# Patient Record
Sex: Female | Born: 1945 | Race: White | Hispanic: No | Marital: Married | Smoking: Former smoker
Health system: Southern US, Academic
[De-identification: ages and names within clinical notes are randomized; demographics above are authoritative.]

## PROBLEM LIST (undated history)

## (undated) DIAGNOSIS — C801 Malignant (primary) neoplasm, unspecified: Secondary | ICD-10-CM

## (undated) DIAGNOSIS — F319 Bipolar disorder, unspecified: Secondary | ICD-10-CM

## (undated) DIAGNOSIS — M129 Arthropathy, unspecified: Secondary | ICD-10-CM

## (undated) DIAGNOSIS — Z973 Presence of spectacles and contact lenses: Secondary | ICD-10-CM

## (undated) DIAGNOSIS — N189 Chronic kidney disease, unspecified: Secondary | ICD-10-CM

## (undated) DIAGNOSIS — J45909 Unspecified asthma, uncomplicated: Secondary | ICD-10-CM

## (undated) DIAGNOSIS — F419 Anxiety disorder, unspecified: Secondary | ICD-10-CM

## (undated) DIAGNOSIS — F32A Depression, unspecified: Secondary | ICD-10-CM

## (undated) DIAGNOSIS — F329 Major depressive disorder, single episode, unspecified: Secondary | ICD-10-CM

## (undated) HISTORY — PX: ABDOMINAL HYSTERECTOMY: SHX81

## (undated) HISTORY — PX: HX HYSTERECTOMY: SHX81

## (undated) HISTORY — PX: HX DILATION AND CURETTAGE: SHX78

## (undated) HISTORY — PX: HX APPENDECTOMY: SHX54

## (undated) HISTORY — PX: PORTACATH PLACEMENT: SHX2246

---

## 1990-03-14 ENCOUNTER — Other Ambulatory Visit (HOSPITAL_COMMUNITY): Payer: Self-pay

## 2016-06-14 ENCOUNTER — Encounter (HOSPITAL_COMMUNITY): Payer: Self-pay

## 2016-06-14 ENCOUNTER — Emergency Department (HOSPITAL_COMMUNITY): Payer: Medicare PPO

## 2016-06-14 ENCOUNTER — Emergency Department (HOSPITAL_COMMUNITY)
Admission: EM | Admit: 2016-06-14 | Discharge: 2016-06-14 | Disposition: A | Discharge status: Home / Self Care | Payer: Medicare PPO | Attending: Emergency Medicine | Admitting: Emergency Medicine

## 2016-06-14 DIAGNOSIS — Y929 Unspecified place or not applicable: Secondary | ICD-10-CM | POA: Diagnosis not present

## 2016-06-14 DIAGNOSIS — J45909 Unspecified asthma, uncomplicated: Secondary | ICD-10-CM | POA: Insufficient documentation

## 2016-06-14 DIAGNOSIS — Y999 Unspecified external cause status: Secondary | ICD-10-CM | POA: Insufficient documentation

## 2016-06-14 DIAGNOSIS — Z79899 Other long term (current) drug therapy: Secondary | ICD-10-CM | POA: Insufficient documentation

## 2016-06-14 DIAGNOSIS — S52591A Other fractures of lower end of right radius, initial encounter for closed fracture: Secondary | ICD-10-CM | POA: Diagnosis not present

## 2016-06-14 DIAGNOSIS — Y9301 Activity, walking, marching and hiking: Secondary | ICD-10-CM | POA: Insufficient documentation

## 2016-06-14 DIAGNOSIS — W108XXA Fall (on) (from) other stairs and steps, initial encounter: Secondary | ICD-10-CM | POA: Diagnosis not present

## 2016-06-14 DIAGNOSIS — S0990XA Unspecified injury of head, initial encounter: Secondary | ICD-10-CM | POA: Diagnosis present

## 2016-06-14 HISTORY — DX: Anxiety disorder, unspecified: F41.9

## 2016-06-14 HISTORY — DX: Unspecified asthma, uncomplicated: J45.909

## 2016-06-14 HISTORY — DX: Depression, unspecified: F32.A

## 2016-06-14 HISTORY — DX: Major depressive disorder, single episode, unspecified: F32.9

## 2016-06-14 MED ORDER — HYDROCODONE-ACETAMINOPHEN 5-325 MG PO TABS: 2.0000 | ORAL_TABLET | ORAL | 0 refills | Status: AC | PRN

## 2016-06-14 NOTE — ED Provider Notes (Signed)
[facility]-EMERGENCY DEPT Provider Note   CSN: [id number] Arrival date & time: 06/14/16  [id number]     History   Chief Complaint Chief Complaint  Patient presents with  . Fall  . Arm Pain    HPI Lowella DellCarol Batty is a 70 y.o. female.  70 year old female presents after mechanical fall while walking down steps. Struck the right side of her head and had a brief loss of consciousness. Denies any neck pain. Does have some sharp right wrist pain is worse with movement. Denies any numbness or tingling to the right hand. Denies any back, chest, abdominal, hip pain. No confusion noted. No visual changes. No vomiting noted. Does not take any blood thinners. Called EMS and was transported here.      Past Medical History:  Diagnosis Date  . Anxiety   . Asthma   . Depression     There are no active problems to display for this patient.   Past Surgical History:  Procedure Laterality Date  . ABDOMINAL HYSTERECTOMY      OB History    No data available       Home Medications    Prior to Admission medications   Not on File    Family History History reviewed. No pertinent family history.  Social History Social History  Substance Use Topics  . Smoking status: Never Smoker  . Smokeless tobacco: Never Used  . Alcohol use No     Allergies   Amoxicillin   Review of Systems Review of Systems  All other systems reviewed and are negative.    Physical Exam Updated Vital Signs BP 154/87   Pulse 86   Temp 97.8 F (36.6 C) (Oral)   Resp 15   SpO2 95%   Physical Exam  Constitutional: She is oriented to person, place, and time. She appears well-developed and well-nourished.  Non-toxic appearance. No distress.  HENT:  Head: Normocephalic and atraumatic.    Eyes: Conjunctivae, EOM and lids are normal. Pupils are equal, round, and reactive to light.  Neck: Normal range of motion. Neck supple. No tracheal deviation present. No thyroid mass present.  Cardiovascular: Normal  rate, regular rhythm and normal heart sounds.  Exam reveals no gallop.   No murmur heard. Pulmonary/Chest: Effort normal and breath sounds normal. No stridor. No respiratory distress. She has no decreased breath sounds. She has no wheezes. She has no rhonchi. She has no rales.  Abdominal: Soft. Normal appearance and bowel sounds are normal. She exhibits no distension. There is no tenderness. There is no rebound and no CVA tenderness.  Musculoskeletal: Normal range of motion. She exhibits no edema or tenderness.       Arms: Neurological: She is alert and oriented to person, place, and time. She has normal strength. No cranial nerve deficit or sensory deficit. GCS eye subscore is 4. GCS verbal subscore is 5. GCS motor subscore is 6.  Skin: Skin is warm and dry. No abrasion and no rash noted.  Psychiatric: She has a normal mood and affect. Her speech is normal and behavior is normal.  Nursing note and vitals reviewed.    ED Treatments / Results  Labs (all labs ordered are listed, but only abnormal results are displayed) Labs Reviewed - No data to display  EKG  EKG Interpretation None       Radiology No results found.  Procedures Procedures (including critical care time)  Medications Ordered in ED Medications - No data to display   Initial Impression / Assessment and  Plan / ED Course  I have reviewed the triage vital signs and the nursing notes.  Pertinent labs & imaging results that were available during my care of the patient were reviewed by me and considered in my medical decision making (see chart for details).  Clinical Course     Splint applied by orthopedic tech. Will follow with her hand surgeon at home.  Final Clinical Impressions(s) / ED Diagnoses   Final diagnoses:  None    New Prescriptions New Prescriptions   No medications on file     Lorre NickAnthony Courtney Fenlon, MD 06/14/16 1121

## 2016-06-14 NOTE — ED Triage Notes (Signed)
Per EMS, pt from  Home.  Pt was walking into garage.  Pt tripped and fell down 2-3 steps.  Pt unaware of LOC.  Remembers full event.  Pt has abrasion to rt frontal forehead. Pt c/o rt arm/shoulder and rt wrist pain.  No deformity noted.  Pt on back board with collar in place.  Log roll for removal of board.  No c/o back pain.  Vitals:  166/87, hr 90, resp 18, 96% ra

## 2016-06-14 NOTE — ED Notes (Signed)
Bed: [id number] Expected date:  Expected time:  Means of arrival:  Comments: 70 yo fall

## 2018-07-02 IMAGING — CT CT HEAD W/O CM
3 of 4 series · 15 of 47 positions shown, 18 images · non-contrast
Comparison: None.

CLINICAL DATA: Pain following fall

EXAM:
CT HEAD WITHOUT CONTRAST
TECHNIQUE: Contiguous axial images were obtained from the base of the skull
through the vertex without intravenous contrast.

[Series 2: head w/o · axial · non-contrast · 0.46mm/px · z∈[+1389,+1519]mm · 9 of 34 slices shown, 12 images]
[im 4/34  brain]
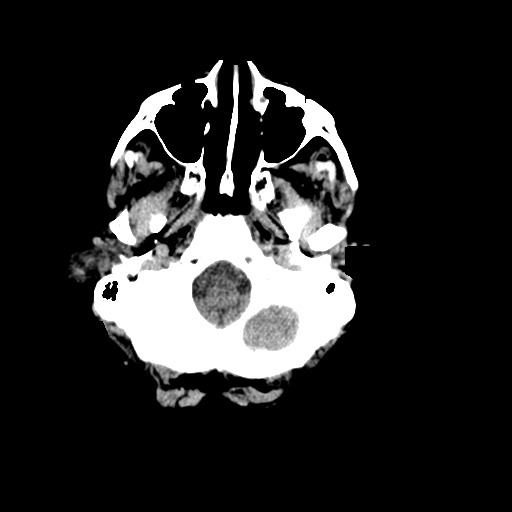
[im 4/34  bone]
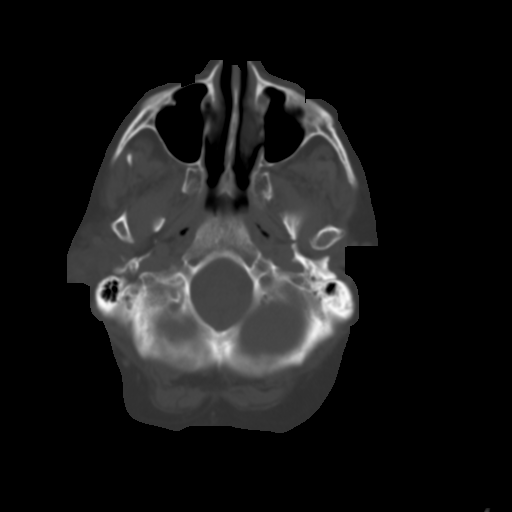
[im 7/34  brain]
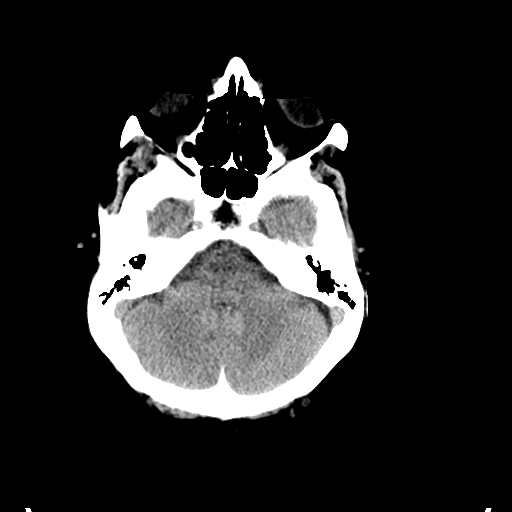
[im 10/34  brain]
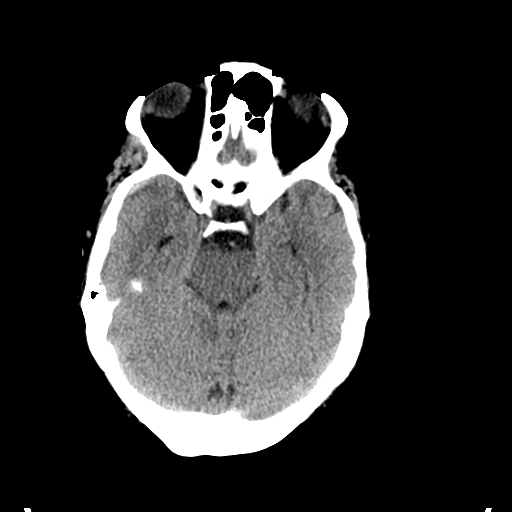
[im 14/34  brain]
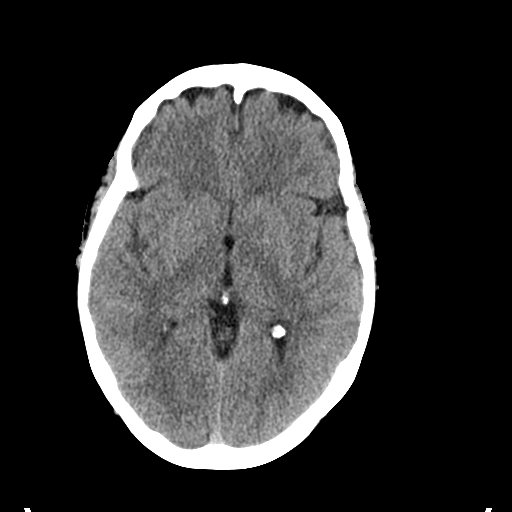
[im 17/34  brain]
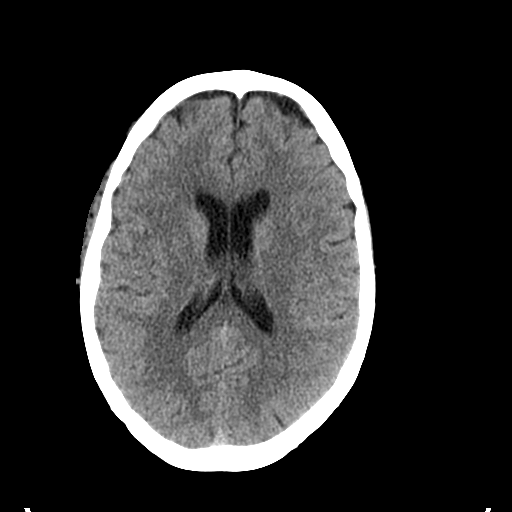
[im 17/34  bone]
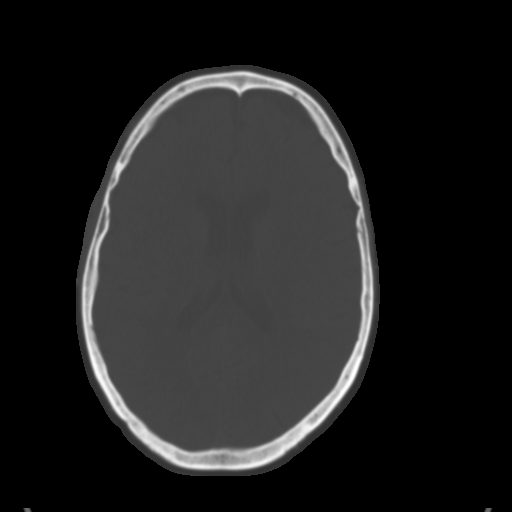
[im 20/34  brain]
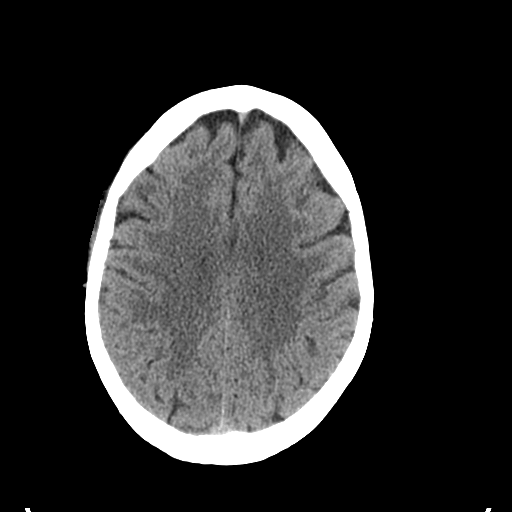
[im 24/34  brain]
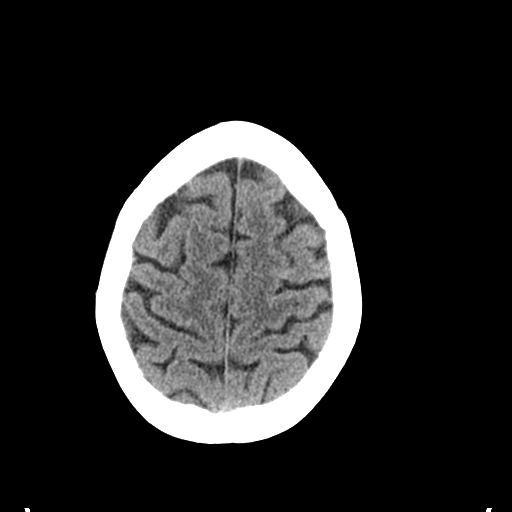
[im 27/34  brain]
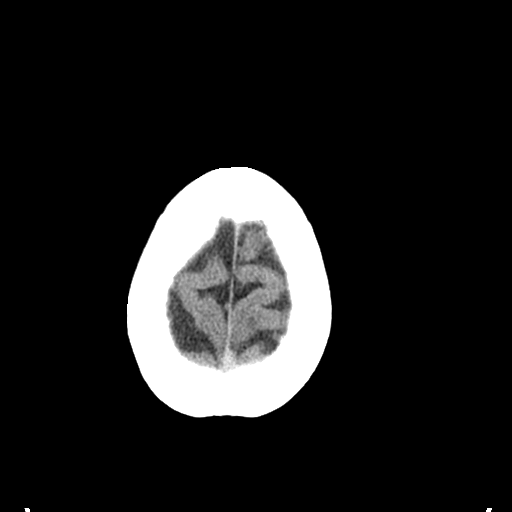
[im 30/34  brain]
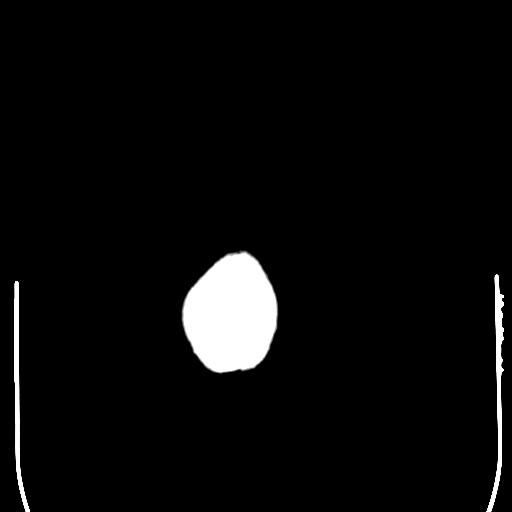
[im 30/34  bone]
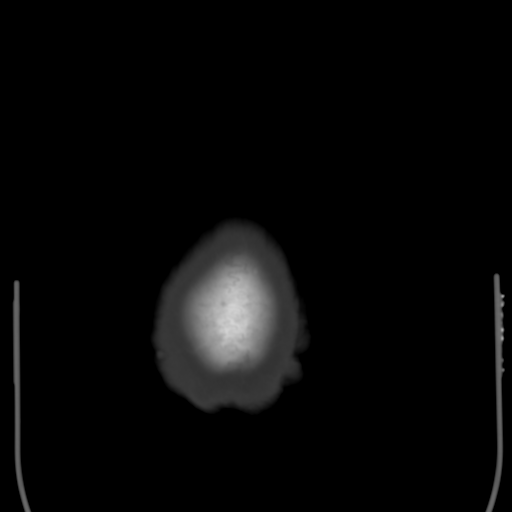

[Series 5: coronal · coronal · 0.32mm/px · 3 of 77 slices shown]
[im 26/77  brain]
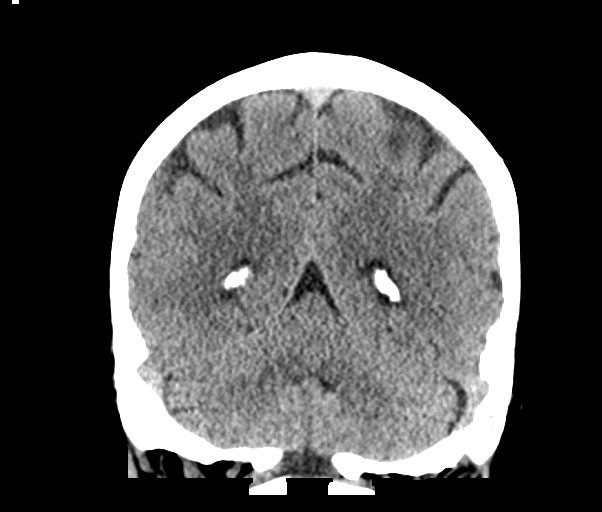
[im 34/77  brain]
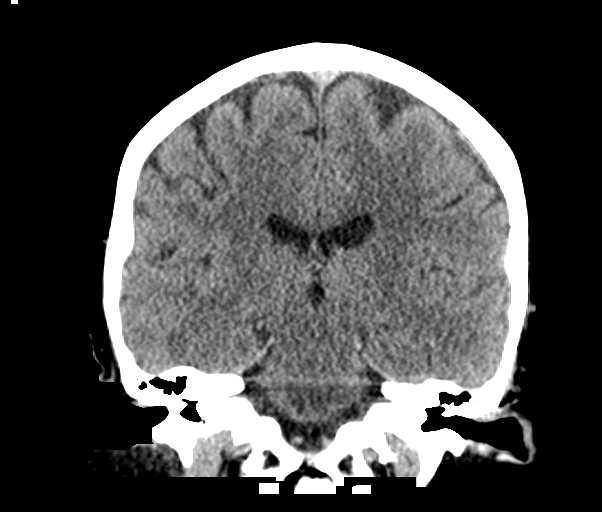
[im 43/77  brain]
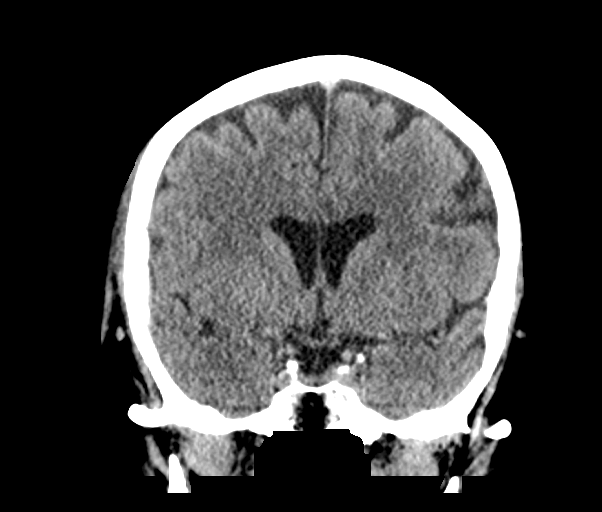

[Series 6: sagittal · sagittal · 0.32mm/px · 3 of 65 slices shown]
[im 22/65  brain]
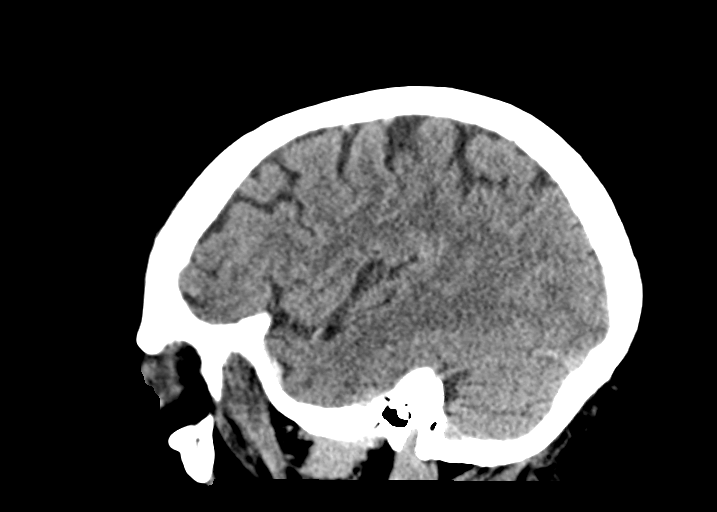
[im 33/65  brain]
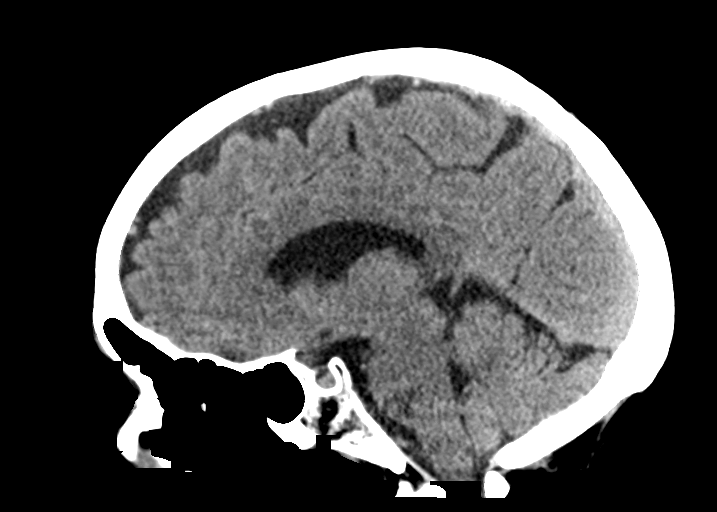
[im 43/65  brain]
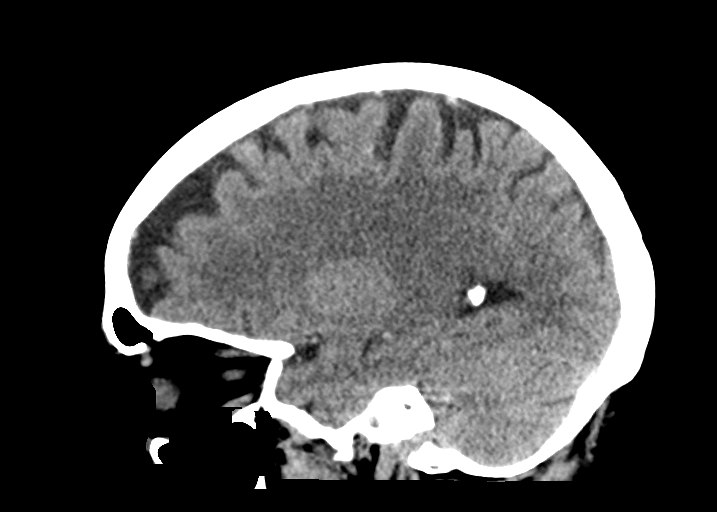

[15 of 47 positions shown; findings below may reference images not displayed]

FINDINGS: Brain: The ventricles are normal in size and configuration. There is
no intracranial mass, hemorrhage, extra-axial fluid collection, or
midline shift. Gray-white compartments are normal. No acute infarct
evident.

Vascular: There is no hyperdense vessel. There is no appreciable
vascular calcification.

Skull: There is a right frontal-temporal junction region scalp
hematoma. The bony calvarium appears intact.

Sinuses/Orbits: Visualized paranasal sinuses are clear. Orbits
appear symmetric bilaterally.

Other: Mastoid air cells are clear.
IMPRESSION: Right frontal -temporal junction scalp hematoma. No fracture
evident. No intracranial mass, hemorrhage, or extra-axial fluid
collection. Gray-white compartments appear normal.

## 2021-03-27 LAB — LAB: COLOGUARD RESULT: POSITIVE — AB

## 2021-03-27 LAB — COLOGUARD® COLON CANCER SCREEN: COLOGUARD RESULT: POSITIVE — AB

## 2021-11-08 ENCOUNTER — Encounter (HOSPITAL_BASED_OUTPATIENT_CLINIC_OR_DEPARTMENT_OTHER): Payer: Self-pay

## 2021-11-08 ENCOUNTER — Emergency Department
Admission: EM | Admit: 2021-11-08 | Discharge: 2021-11-08 | Discharge status: Against Medical Advice | Payer: Commercial Managed Care - PPO | Attending: Emergency Medicine | Admitting: Emergency Medicine

## 2021-11-08 ENCOUNTER — Emergency Department (EMERGENCY_DEPARTMENT_HOSPITAL)
Admission: EM | Admit: 2021-11-08 | Discharge: 2021-11-08 | Disposition: A | Discharge status: Home / Self Care | Payer: Commercial Managed Care - PPO | Source: Home / Self Care | Attending: Emergency Medical Services | Admitting: Emergency Medical Services

## 2021-11-08 ENCOUNTER — Other Ambulatory Visit: Payer: Self-pay

## 2021-11-08 DIAGNOSIS — R609 Edema, unspecified: Secondary | ICD-10-CM

## 2021-11-08 DIAGNOSIS — M79606 Pain in leg, unspecified: Secondary | ICD-10-CM

## 2021-11-08 DIAGNOSIS — Z5329 Procedure and treatment not carried out because of patient's decision for other reasons: Secondary | ICD-10-CM | POA: Insufficient documentation

## 2021-11-08 DIAGNOSIS — Z5321 Procedure and treatment not carried out due to patient leaving prior to being seen by health care provider: Secondary | ICD-10-CM | POA: Insufficient documentation

## 2021-11-08 HISTORY — DX: Malignant (primary) neoplasm, unspecified: C80.1

## 2021-11-08 HISTORY — DX: Arthropathy, unspecified: M12.9

## 2021-11-08 HISTORY — DX: Presence of spectacles and contact lenses: Z97.3

## 2021-11-08 HISTORY — DX: Unspecified asthma, uncomplicated: J45.909

## 2021-11-08 LAB — COMPREHENSIVE METABOLIC PANEL, NON-FASTING
ALBUMIN/GLOBULIN RATIO: 1.1 (ref 0.8–1.4)
ALBUMIN: 3.5 g/dL (ref 3.4–5.0)
ALKALINE PHOSPHATASE: 152 U/L — ABNORMAL HIGH (ref 46–116)
ALT (SGPT): 21 U/L (ref <=78)
ANION GAP: 5 mmol/L — ABNORMAL LOW (ref 10–20)
AST (SGOT): 25 U/L (ref 15–37)
BILIRUBIN TOTAL: 0.4 mg/dL (ref 0.2–1.0)
BUN/CREA RATIO: 12
BUN: 22 mg/dL — ABNORMAL HIGH (ref 7–18)
CALCIUM, CORRECTED: 10.2 mg/dL
CALCIUM: 9.7 mg/dL (ref 8.5–10.1)
CHLORIDE: 104 mmol/L (ref 98–107)
CO2 TOTAL: 29 mmol/L (ref 21–32)
CREATININE: 1.89 mg/dL — ABNORMAL HIGH (ref 0.55–1.02)
ESTIMATED GFR: 28 mL/min/{1.73_m2} — ABNORMAL LOW (ref >59)
GLOBULIN: 3.1
GLUCOSE: 103 mg/dL (ref 74–106)
OSMOLALITY, CALCULATED: 279 mOsm/kg (ref 270–290)
POTASSIUM: 4.2 mmol/L (ref 3.5–5.1)
PROTEIN TOTAL: 6.6 g/dL (ref 6.4–8.2)
SODIUM: 138 mmol/L (ref 136–145)

## 2021-11-08 LAB — D-DIMER: D-DIMER: 1252 ng/mL DDU (ref <=232)

## 2021-11-08 LAB — CBC WITH DIFF
BASOPHIL #: 0.07 10*3/uL (ref 0.00–2.50)
BASOPHIL %: 1 % (ref 0–3)
EOSINOPHIL #: 0.81 10*3/uL (ref 0.00–2.40)
EOSINOPHIL %: 7 % (ref 0–7)
HCT: 45 % (ref 37.0–47.0)
HGB: 15.5 g/dL (ref 12.5–16.0)
LYMPHOCYTE #: 3.81 10*3/uL (ref 2.10–11.00)
LYMPHOCYTE %: 34 % (ref 25–45)
MCH: 33.8 pg — ABNORMAL HIGH (ref 27.0–32.0)
MCHC: 34.4 g/dL (ref 32.0–36.0)
MCV: 98.3 fL (ref 78.0–99.0)
MONOCYTE #: 0.76 10*3/uL (ref 0.00–4.10)
MONOCYTE %: 7 % (ref 0–12)
MPV: 8.6 fL (ref 7.4–10.4)
NEUTROPHIL #: 5.63 10*3/uL (ref 4.10–29.00)
NEUTROPHIL %: 51 % (ref 40–76)
PLATELETS: 246 10*3/uL (ref 140–440)
RBC: 4.58 10*6/uL (ref 4.20–5.40)
RDW: 14.7 % (ref 11.6–14.8)
WBC: 11.1 10*3/uL — ABNORMAL HIGH (ref 4.0–10.5)

## 2021-11-08 NOTE — ED Triage Notes (Signed)
Was here just last night for same thing, had blood drawn. Patient left before being seen. Was called this morning and was told to come back.

## 2021-11-08 NOTE — ED Nurses Note (Signed)
Pt called to get lab results. Informed pt that d-dimer is elevated and she should come back to the er for further evaluation. Pt stated she will call her doctor, Dr Erenest Rasher, and update her instead of coming back into the ER.

## 2021-11-08 NOTE — ED Nurses Note (Signed)
Elevated d-dimer received and relayed to Dr Nelson Chimes.

## 2021-11-08 NOTE — ED Nurses Note (Signed)
Pt stated that she already took medications to help her sleep before she came to the ED and cannot wait any longer. Pt stated that she would follow up with her PCP tomorrow.

## 2021-11-08 NOTE — ED Triage Notes (Signed)
Pt has history of blood clots in her legs. Pt states that three weeks ago her PCP told her to stop taking aspirin. Pt states that the leg pain and swelling started a few weeks ago. Pt states that sometimes walking helps but is afraid she may be developing blood clots again due to an increasing shortness of breath the last few days.

## 2021-11-08 NOTE — ED Provider Notes (Signed)
[facility] Emergency Department  ED Primary Provider Note  History of Present Illness   Chief Complaint   Patient presents with   . Leg Pain     Arrival: The patient arrived by Car    Paula Cochran is a 76 y.o. female who had concerns including Leg Pain.  PatientReview of Systems   Constitutional: No fever, chi here yesterday and found to have an elevated D-dimer.  Patient was told to return for an ultrasound of her legs.  Patient is angry on arrival states she has been here 3 hours does not want to be transferred to [facility] for an ultrasound.  I explained to her that we do not have the capacity to do ultrasound here denies shortness of breath or chest pain.  Patient is anxious and angry  Patient was seen in triage labs drawn but she did not wait for results patient left AMA.  Skin: No rash or diaphoresis  HENT: No headaches, or congestion  Eyes: No vision changes or photophobia   Cardio: No chest pain, palpitations or leg swelling   Respiratory: No cough, wheezing or SOB  GI:  No nausea, vomiting or stool changes  GU:  No dysuria, hematuria, or increased frequency  MSK: No muscle aches, joint or back pain  Neuro: No seizures, LOC, numbness, tingling, or focal weakness  Psychiatric: No depression, SI or substance abuse  All other systems reviewed and are negative.    Historical Data   History Reviewed This Encounter:      Physical Exam   ED Triage Vitals [11/08/21 1438]   BP (Non-Invasive) 124/72   Heart Rate 78   Respiratory Rate 18   Temperature 36.4 C (97.6 F)   SpO2 98 %   Weight 81.6 kg (180 lb)   Height 1.6 m ('5\' 3"'$ )       Constitutional:  76 y.o. female who appears in no distress. Normal color, no cyanosis.   HENT:   Head: Normocephalic and atraumatic.   Mouth/Throat: Oropharynx is clear and moist.   Eyes: EOMI, PERRL   Neck: Trachea midline. Neck supple.  Cardiovascular: RRR, No murmurs, rubs or gallops. Intact distal pulses.  Pulmonary/Chest: BS equal  bilaterally. No respiratory distress. No wheezes, rales or chest tenderness.   Abdominal: Bowel sounds present and normal. Abdomen soft, no tenderness, no rebound and no guarding.  Back: No midline spinal tenderness, no paraspinal tenderness, no CVA tenderness.           Musculoskeletal: No edema, tenderness or deformity.  Skin: warm and dry. No rash, erythema, pallor or cyanosis  Psychiatric: normal mood and affect. Behavior is normal.   Neurological: Patient keenly alert and responsive, easily able to raise eyebrows, facial muscles/expressions symmetric, speaking in fluent sentences, moving all extremities equally and fully, normal gait  Patient Data   Labs Ordered/Reviewed - No data to display  No orders to display     Medical Decision Making     rule out acute renal failure patient had labs drawn yesterday rule out bilateral DVT rule out chronic dependent edema.  Plan transfer to [facility] for ultrasound of bilateral legs patient refuses transfer    MDM            Clinical Impression   Edema, unspecified type (Primary)       Disposition: AMA

## 2021-11-08 NOTE — Nurses Notes (Signed)
Patient reports bilateral leg pain, reports having blood drawn last night and left, was going to follow up with PCP. PCP informed patient she needed an ultrasound, patient refusing to go to [place] "for numerous reasons I will not go there". Patient signing refusal at this time. Denies any shortness of breath. Chronic dependent edema bilateral lower legs.

## 2021-11-08 NOTE — Nurses Notes (Signed)
Patient signed refusal for ultrasound at this time, patient leaving ER via wheelchair at this time. Patient given verbal and written d/c instructions. Informed to return with any concerns.

## 2021-11-09 ENCOUNTER — Inpatient Hospital Stay (HOSPITAL_COMMUNITY): Payer: Commercial Managed Care - PPO

## 2021-11-09 ENCOUNTER — Emergency Department
Admission: EM | Admit: 2021-11-09 | Discharge: 2021-11-10 | Disposition: A | Discharge status: Home / Self Care | Payer: Commercial Managed Care - PPO | Attending: Emergency Medicine | Admitting: Emergency Medicine

## 2021-11-09 ENCOUNTER — Other Ambulatory Visit: Payer: Self-pay

## 2021-11-09 DIAGNOSIS — N189 Chronic kidney disease, unspecified: Secondary | ICD-10-CM | POA: Insufficient documentation

## 2021-11-09 DIAGNOSIS — Z86718 Personal history of other venous thrombosis and embolism: Secondary | ICD-10-CM | POA: Insufficient documentation

## 2021-11-09 DIAGNOSIS — M25562 Pain in left knee: Secondary | ICD-10-CM | POA: Insufficient documentation

## 2021-11-09 DIAGNOSIS — M25561 Pain in right knee: Secondary | ICD-10-CM | POA: Insufficient documentation

## 2021-11-09 DIAGNOSIS — R600 Localized edema: Secondary | ICD-10-CM | POA: Insufficient documentation

## 2021-11-09 DIAGNOSIS — M7989 Other specified soft tissue disorders: Secondary | ICD-10-CM

## 2021-11-09 DIAGNOSIS — G8929 Other chronic pain: Secondary | ICD-10-CM | POA: Insufficient documentation

## 2021-11-09 NOTE — ED Triage Notes (Signed)
Needs an ultra sound of both legs. States she is having pain behind both knees and has a hx of blood clots in left legs.  Took one 81 mg of aspirin this am.  Duration last wed/tues.  Was seen at [facility] wed night and Thurs. Day.  Did a blood test. But nothing else.

## 2021-11-10 ENCOUNTER — Encounter (HOSPITAL_COMMUNITY): Payer: Self-pay

## 2021-11-10 LAB — COMPREHENSIVE METABOLIC PANEL, NON-FASTING
ALBUMIN/GLOBULIN RATIO: 1.3 (ref 0.8–1.4)
ALBUMIN: 4 g/dL (ref 3.5–5.7)
ALKALINE PHOSPHATASE: 126 U/L — ABNORMAL HIGH (ref 34–104)
ALT (SGPT): 13 U/L (ref 7–52)
ANION GAP: 5 mmol/L — ABNORMAL LOW (ref 10–20)
AST (SGOT): 20 U/L (ref 13–39)
BILIRUBIN TOTAL: 0.4 mg/dL (ref 0.3–1.2)
BUN/CREA RATIO: 14 (ref 6–22)
BUN: 25 mg/dL (ref 7–25)
CALCIUM, CORRECTED: 9.4 mg/dL (ref 8.9–10.8)
CALCIUM: 9.4 mg/dL (ref 8.6–10.3)
CHLORIDE: 108 mmol/L — ABNORMAL HIGH (ref 98–107)
CO2 TOTAL: 27 mmol/L (ref 21–31)
CREATININE: 1.74 mg/dL — ABNORMAL HIGH (ref 0.60–1.30)
ESTIMATED GFR: 30 mL/min/{1.73_m2} — ABNORMAL LOW (ref >59)
GLOBULIN: 3 (ref 2.9–5.4)
GLUCOSE: 103 mg/dL (ref 74–109)
OSMOLALITY, CALCULATED: 284 mOsm/kg (ref 270–290)
POTASSIUM: 4 mmol/L (ref 3.5–5.1)
PROTEIN TOTAL: 7 g/dL (ref 6.4–8.9)
SODIUM: 140 mmol/L (ref 136–145)

## 2021-11-10 LAB — CBC WITH DIFF
BASOPHIL #: 0.1 10*3/uL (ref 0.00–2.50)
BASOPHIL %: 1 % (ref 0–3)
EOSINOPHIL #: 1 10*3/uL (ref 0.00–2.40)
EOSINOPHIL %: 9 % — ABNORMAL HIGH (ref 0–7)
HCT: 44.6 % (ref 37.0–47.0)
HGB: 15.2 g/dL (ref 12.5–16.0)
LYMPHOCYTE #: 3.5 10*3/uL (ref 2.10–11.00)
LYMPHOCYTE %: 33 % (ref 25–45)
MCH: 33.2 pg — ABNORMAL HIGH (ref 27.0–32.0)
MCHC: 34.1 g/dL (ref 32.0–36.0)
MCV: 97.4 fL (ref 78.0–99.0)
MONOCYTE #: 0.7 10*3/uL (ref 0.00–4.10)
MONOCYTE %: 7 % (ref 0–12)
MPV: 9.3 fL (ref 7.4–10.4)
NEUTROPHIL #: 5.2 10*3/uL (ref 4.10–29.00)
NEUTROPHIL %: 50 % (ref 40–76)
PLATELETS: 228 10*3/uL (ref 140–440)
RBC: 4.58 10*6/uL (ref 4.20–5.40)
RDW: 13 % (ref 11.6–14.8)
WBC: 10.5 10*3/uL (ref 4.0–10.5)
WBCS UNCORRECTED: 10.5 10*3/uL

## 2021-11-10 LAB — MAGNESIUM: MAGNESIUM: 2.2 mg/dL (ref 1.9–2.7)

## 2021-11-10 LAB — GOLD TOP TUBE

## 2021-11-10 LAB — BLUE TOP TUBE

## 2021-11-10 NOTE — ED Provider Notes (Signed)
[facility]  ED Primary Provider Note  History of Present Illness   chief complaint    Arrival: The patient arrived by Car         Paula Cochran is a 76 y.o. female who had concerns including Leg Pain.  Patient 76 year old female complains of bilateral lower extremity pain times 2 days.  She was seen at [facility] twice for same.  She rates the pain is 8/10 and throbbing in nature.  It is worse when she is trying to sleep when she 1st gets up in the morning.  It does improve with ambulation.  She states she has chronic bilateral knee pain.  Right greater than left.  She also has a history of DVT.  She was seen in [place] for same on 2 separate occasions.  Her D-dimer was 1252.  Since that up leaving AMA from [facility].  Her family doctor (Dr. Ileene Patrick) advised patient to come to the emergency room today.  She is having no chest pain or shortness of breath.  She has good sensation distally with brisk capillary refill.  She has 2+ pitting edema to bilateral lower extremities  All nursing notes reviewed          Review of Systems     No other overt Review of Systems are noted to be positive except noted in the HPI.      Historical Data   History Reviewed This Encounter: Medical History  Surgical History  Family History  Social History        Physical Exam   ED Triage Vitals [11/09/21 2138]   BP (Non-Invasive) (!) 149/74   Heart Rate 84   Respiratory Rate 18   Temperature 36.6 C (97.8 F)   SpO2 96 %   Weight 81.6 kg (180 lb)   Height 1.6 m (5' 3" )         Exam:   Constitutional:  Patient alert orient x3 in no apparent distress.  No limitations.  Head: Atraumatic normocephalic  Eyes :  Pupils are equal round reactive to light and accommodation extraocular muscles are intact.  Sclera and conjunctiva are unremarkable  Ears:  Tympanic membranes are pearly gray bilaterally; external auditory canals are unremarkable; external ears without any lesions  Nose:  Nares are patent  turbinates are pink and moist  Mouth:  Mucosa is pink and moist without lesions.  Posterior pharynx is pink and moist without hypertrophy/exudate.  Neck:  Soft and supple without palpable lymphadenopathy.  Heart:  Regular rate and rhythm without audible murmur  Lungs:  Clear to auscultation bilaterally without any wheezing/rales/rhonchi  Abdomen:  Soft nontender without any rebound or guarding; positive bowel sounds throughout  Genitalia:  Deferred  Skin:  Warm and dry without lesions.  Normal skin turgor.  Brisk capillary refill distally  Extremities:  Good strenght bilaterally with full range of motion of upper and lower extremities.  Bilateral 2+ pitting edema.  Excellent sensation distally brisk capillary refill  Neuro:  Alert oriented x3.  Cranial nerves II-XII grossly intact as tested.  Excellent sensation distally over all dermatomes.  Psychiatric:  Patient cooperative, affect appropriate, insight and judgment good        Procedures      Patient Data     Labs Ordered/Reviewed   COMPREHENSIVE METABOLIC PANEL, NON-FASTING - Abnormal; Notable for the following components:       Result Value    CHLORIDE 108 (*)     ANION GAP 5 (*)  CREATININE 1.74 (*)     ESTIMATED GFR 30 (*)     ALKALINE PHOSPHATASE 126 (*)     All other components within normal limits    Narrative:     Estimated Glomerular Filtration Rate (eGFR) is calculated using the CKD-EPI (2021) equation, intended for patients 35 years of age and older. If gender is not documented or "unknown", there will be no eGFR calculation.   CBC WITH DIFF - Abnormal; Notable for the following components:    MCH 33.2 (*)     EOSINOPHIL % 9 (*)     All other components within normal limits   MAGNESIUM - Normal   CBC/DIFF    Narrative:     The following orders were created for panel order CBC/DIFF.  Procedure                               Abnormality         Status                     ---------                               -----------         ------                      CBC WITH DIFF[[id number]]                Abnormal            Final result                 Please view results for these tests on the individual orders.   EXTRA TUBES    Narrative:     The following orders were created for panel order EXTRA TUBES.  Procedure                               Abnormality         Status                     ---------                               -----------         ------                     BLUE TOP VPXT[[id number]]                                    In process                 GOLD TOP TUBE[[id number]]                                    In process                   Please view results for these tests on the individual orders.   BLUE TOP TUBE   GOLD TOP TUBE       No  orders to display       Medical Decision Making          Medical Decision Making  Ultrasound of bilateral lower extremity shows no DVT.  Creatinine is mildly elevated to 1.74.    Amount and/or Complexity of Data Reviewed  Labs: ordered. Decision-making details documented in ED Course.     Details: Creatinine 1.74 this is patient's baseline.  Radiology: ordered and independent interpretation performed.     Details: No DVT      Critical Care  Total time providing critical care: 0 minutes      ED Course as of 11/10/21 0237   Sat Nov 10, 2021   0228 CREATININE(!): 1.74              Following the history, physical exam, and ED workup, the patient was deemed stable and suitable for discharge. The patient/caregiver was advised to return to the ED for any new or worsening symptoms. Discharge medications, and follow-up instructions were discussed with the patient/caregiver in detail, who verbalizes understanding. The patient/caregiver is in agreement and is comfortable with the plan of care.    Disposition: Discharged         Current Discharge Medication List      CONTINUE these medications - NO CHANGES were made during your visit.      Details   albuterol sulfate 90 mcg/actuation oral inhaler  Commonly known as: PROVENTIL or VENTOLIN  or PROAIR   1-2 Puffs, Inhalation, EVERY 6 HOURS PRN  Refills: 0     DULoxetine 30 mg Capsule, Delayed Release(E.C.)  Commonly known as: CYMBALTA DR   30 mg, Oral, DAILY  Refills: 0     ergocalciferol (vitamin D2) 1,250 mcg (50,000 unit) Capsule  Commonly known as: DRISDOL   50,000 Units, Oral, EVERY 7 DAYS  Refills: 0     lithium carbonate 150 mg Capsule  Commonly known as: ESKALITH   150 mg, Oral, DAILY  Refills: 0     LORazepam 1 mg Tablet  Commonly known as: ATIVAN   1 mg, Oral, 2 TIMES DAILY PRN  Refills: 0          Follow up:   No follow-up provider specified.               Clinical Impression   Swelling of lower extremity   Bilateral lower extremity edema (Primary)   Chronic renal impairment, unspecified CKD stage         Current Discharge Medication List          R.A. Baldwin [place], DO  Department of Emergency Medicine

## 2021-11-10 NOTE — Discharge Instructions (Signed)
Elevate legs as much as possible  Take all regular medications as prescribed  Follow-up with family doctor as scheduled on the 20th  Return to emergency room for any increased discomfort, numbness/tingling, fever, or any concerns

## 2021-11-13 ENCOUNTER — Inpatient Hospital Stay (HOSPITAL_BASED_OUTPATIENT_CLINIC_OR_DEPARTMENT_OTHER)
Admission: RE | Admit: 2021-11-13 | Discharge: 2021-11-13 | Disposition: A | Discharge status: Home / Self Care | Payer: Commercial Managed Care - PPO | Source: Ambulatory Visit

## 2021-11-13 ENCOUNTER — Inpatient Hospital Stay
Admission: RE | Admit: 2021-11-13 | Discharge: 2021-11-13 | Disposition: A | Discharge status: Home / Self Care | Payer: Commercial Managed Care - PPO | Source: Ambulatory Visit | Attending: Family Medicine | Admitting: Family Medicine

## 2021-11-13 ENCOUNTER — Other Ambulatory Visit (HOSPITAL_COMMUNITY): Payer: Self-pay | Admitting: Family Medicine

## 2021-11-13 ENCOUNTER — Other Ambulatory Visit: Payer: Self-pay

## 2021-11-13 DIAGNOSIS — R7989 Other specified abnormal findings of blood chemistry: Secondary | ICD-10-CM | POA: Insufficient documentation

## 2021-11-13 DIAGNOSIS — R0602 Shortness of breath: Secondary | ICD-10-CM

## 2022-08-30 ENCOUNTER — Other Ambulatory Visit: Payer: Self-pay

## 2022-08-30 ENCOUNTER — Emergency Department
Admission: EM | Admit: 2022-08-30 | Discharge: 2022-08-30 | Disposition: A | Discharge status: Home / Self Care | Payer: Commercial Managed Care - PPO | Attending: Family | Admitting: Family

## 2022-08-30 ENCOUNTER — Emergency Department (HOSPITAL_COMMUNITY): Payer: Commercial Managed Care - PPO

## 2022-08-30 ENCOUNTER — Encounter (HOSPITAL_COMMUNITY): Payer: Self-pay | Admitting: Family

## 2022-08-30 DIAGNOSIS — J45901 Unspecified asthma with (acute) exacerbation: Secondary | ICD-10-CM | POA: Insufficient documentation

## 2022-08-30 DIAGNOSIS — Z1152 Encounter for screening for COVID-19: Secondary | ICD-10-CM | POA: Insufficient documentation

## 2022-08-30 LAB — COMPREHENSIVE METABOLIC PANEL, NON-FASTING
ALBUMIN/GLOBULIN RATIO: 1.8 — ABNORMAL HIGH (ref 0.8–1.4)
ALBUMIN: 4.2 g/dL (ref 3.5–5.7)
ALKALINE PHOSPHATASE: 166 U/L — ABNORMAL HIGH (ref 34–104)
ALT (SGPT): 20 U/L (ref 7–52)
ANION GAP: 5 mmol/L (ref 4–13)
AST (SGOT): 21 U/L (ref 13–39)
BILIRUBIN TOTAL: 0.4 mg/dL (ref 0.3–1.2)
BUN/CREA RATIO: 14 (ref 6–22)
BUN: 24 mg/dL (ref 7–25)
CALCIUM, CORRECTED: 9.3 mg/dL (ref 8.9–10.8)
CALCIUM: 9.5 mg/dL (ref 8.6–10.3)
CHLORIDE: 112 mmol/L — ABNORMAL HIGH (ref 98–107)
CO2 TOTAL: 25 mmol/L (ref 21–31)
CREATININE: 1.67 mg/dL — ABNORMAL HIGH (ref 0.60–1.30)
ESTIMATED GFR: 32 mL/min/{1.73_m2} — ABNORMAL LOW (ref >59)
GLOBULIN: 2.4 — ABNORMAL LOW (ref 2.9–5.4)
GLUCOSE: 98 mg/dL (ref 74–109)
OSMOLALITY, CALCULATED: 287 mOsm/kg (ref 270–290)
POTASSIUM: 4.9 mmol/L (ref 3.5–5.1)
PROTEIN TOTAL: 6.6 g/dL (ref 6.4–8.9)
SODIUM: 142 mmol/L (ref 136–145)

## 2022-08-30 LAB — CBC WITH DIFF
BASOPHIL #: 0.1 10*3/uL (ref 0.00–0.10)
BASOPHIL %: 1 % (ref 0–1)
EOSINOPHIL #: 1 10*3/uL — ABNORMAL HIGH (ref 0.00–0.50)
EOSINOPHIL %: 9 % — ABNORMAL HIGH
HCT: 47.3 % — ABNORMAL HIGH (ref 31.2–41.9)
HGB: 15.5 g/dL — ABNORMAL HIGH (ref 10.9–14.3)
LYMPHOCYTE #: 2.1 10*3/uL (ref 1.00–3.00)
LYMPHOCYTE %: 19 % (ref 16–44)
MCH: 32.3 pg (ref 24.7–32.8)
MCHC: 32.8 g/dL (ref 32.3–35.6)
MCV: 98.6 fL — ABNORMAL HIGH (ref 75.5–95.3)
MONOCYTE #: 0.7 10*3/uL (ref 0.30–1.00)
MONOCYTE %: 6 % (ref 5–13)
MPV: 9.4 fL (ref 7.9–10.8)
NEUTROPHIL #: 7.6 10*3/uL (ref 1.85–7.80)
NEUTROPHIL %: 66 % (ref 43–77)
PLATELETS: 213 10*3/uL (ref 140–440)
RBC: 4.79 10*6/uL (ref 3.63–4.92)
RDW: 13.3 % (ref 12.3–17.7)
WBC: 11.6 10*3/uL (ref 3.8–11.8)

## 2022-08-30 LAB — B-TYPE NATRIURETIC PEPTIDE (BNP),PLASMA: BNP: 67 pg/mL (ref 5–100)

## 2022-08-30 LAB — COVID-19, FLU A/B, RSV RAPID BY PCR
INFLUENZA VIRUS TYPE A: NOT DETECTED
INFLUENZA VIRUS TYPE B: NOT DETECTED
RESPIRATORY SYNCTIAL VIRUS (RSV): NOT DETECTED
SARS-CoV-2: NOT DETECTED

## 2022-08-30 LAB — TROPONIN-I
TROPONIN I: 5 ng/L (ref <15)
TROPONIN I: 5 ng/L (ref <15)

## 2022-08-30 LAB — PT/INR
INR: 0.88 (ref 0.84–1.10)
PROTHROMBIN TIME: 10.2 seconds (ref 9.8–12.7)

## 2022-08-30 LAB — PTT (PARTIAL THROMBOPLASTIN TIME): APTT: 32 seconds (ref 25.0–38.0)

## 2022-08-30 MED ORDER — ALBUTEROL SULFATE 2.5 MG/3 ML (0.083 %) SOLUTION FOR NEBULIZATION
2.5000 mg | INHALATION_SOLUTION | RESPIRATORY_TRACT | Status: AC
Start: 2022-08-30 — End: 2022-08-30
  Administered 2022-08-30: 2.5 mg via RESPIRATORY_TRACT

## 2022-08-30 MED ORDER — METHYLPREDNISOLONE SOD SUCC 125 MG SOLUTION FOR INJECTION WRAPPER
125.0000 mg | INTRAVENOUS | Status: AC
Start: 2022-08-30 — End: 2022-08-30
  Administered 2022-08-30: 125 mg via INTRAVENOUS

## 2022-08-30 MED ORDER — PREDNISONE 50 MG TABLET
50.0000 mg | ORAL_TABLET | Freq: Every day | ORAL | 0 refills | Status: AC
Start: 2022-08-30 — End: 2022-09-04

## 2022-08-30 MED ORDER — ASPIRIN 81 MG CHEWABLE TABLET
324.0000 mg | CHEWABLE_TABLET | ORAL | Status: AC
Start: 2022-08-30 — End: 2022-08-30
  Administered 2022-08-30: 324 mg via ORAL

## 2022-08-30 MED ORDER — ASPIRIN 81 MG CHEWABLE TABLET
CHEWABLE_TABLET | ORAL | Status: AC
Start: 2022-08-30 — End: 2022-08-30
  Filled 2022-08-30: qty 4

## 2022-08-30 MED ORDER — IPRATROPIUM 0.5 MG-ALBUTEROL 3 MG (2.5 MG BASE)/3 ML NEBULIZATION SOLN
3.0000 mL | INHALATION_SOLUTION | RESPIRATORY_TRACT | Status: AC
Start: 2022-08-30 — End: 2022-08-30
  Administered 2022-08-30: 3 mL via RESPIRATORY_TRACT

## 2022-08-30 MED ORDER — METHYLPREDNISOLONE SOD SUCC 125 MG SOLUTION FOR INJECTION WRAPPER
INTRAVENOUS | Status: AC
Start: 2022-08-30 — End: 2022-08-30
  Filled 2022-08-30: qty 2

## 2022-08-30 MED ORDER — SODIUM CHLORIDE 0.9 % IV BOLUS
1000.0000 mL | INJECTION | Status: DC
Start: 2022-08-30 — End: 2022-08-30

## 2022-08-30 NOTE — ED Provider Notes (Signed)
[facility]  ED Primary Provider Note  History of Present Illness   Chief Complaint   Patient presents with    Chest Pain     Shortness of Breath     Paula Cochran is a 77 y.o. female who had concerns including Chest Pain  and Shortness of Breath.  Arrival: The patient arrived by Car    Patient is a 77 year old female to the emergency department complaining of substernal chest pain and shortness of breath.  Patient states she has a history of asthma, and has been wheezing since Wednesday.  Patient states she has been using albuterol at home with some relief.  Patient denies any recent illness, or exposure to allergens that would exacerbate her asthma.  Patient states she is use the rescue inhaler, but denies any further medication.  Patient denies any productive cough, hemoptysis, unilateral leg swelling or prolonged immobilization.  Patient otherwise has no cardiac history, including hypertension hyperlipidemia obesity or smoking.      History Reviewed This Encounter: Medical History  Surgical History  Family History  Social History    Physical Exam   ED Triage Vitals [08/30/22 1005]   BP (Non-Invasive) (!) 141/65   Heart Rate 95   Respiratory Rate 20   Temperature 36.8 C (98.2 F)   SpO2 92 %   Weight 81.6 kg (180 lb)   Height 1.6 m ('5\' 3"'$ )     Physical Exam  Vitals and nursing note reviewed.   Constitutional:       General: She is not in acute distress.     Appearance: She is well-developed.   HENT:      Head: Normocephalic and atraumatic.   Eyes:      Conjunctiva/sclera: Conjunctivae normal.   Cardiovascular:      Rate and Rhythm: Normal rate and regular rhythm.      Heart sounds: No murmur heard.  Pulmonary:      Effort: Pulmonary effort is normal. Tachypnea present. No respiratory distress.      Breath sounds: Decreased air movement present. Wheezing present.   Abdominal:      Palpations: Abdomen is soft.      Tenderness: There is no abdominal tenderness.    Musculoskeletal:         General: No swelling.      Cervical back: Neck supple.   Skin:     General: Skin is warm and dry.      Capillary Refill: Capillary refill takes less than 2 seconds.   Neurological:      Mental Status: She is alert.   Psychiatric:         Mood and Affect: Mood normal.       Patient Data     Labs Ordered/Reviewed   COMPREHENSIVE METABOLIC PANEL, NON-FASTING - Abnormal; Notable for the following components:       Result Value    CHLORIDE 112 (*)     CREATININE 1.67 (*)     ESTIMATED GFR 32 (*)     ALKALINE PHOSPHATASE 166 (*)     ALBUMIN/GLOBULIN RATIO 1.8 (*)     GLOBULIN 2.4 (*)     All other components within normal limits    Narrative:     Estimated Glomerular Filtration Rate (eGFR) is calculated using the CKD-EPI (2021) equation, intended for patients 41 years of age and older. If gender is not documented or "unknown", there will be no eGFR calculation.     CBC WITH DIFF -  Abnormal; Notable for the following components:    HGB 15.5 (*)     HCT 47.3 (*)     MCV 98.6 (*)     EOSINOPHIL % 9 (*)     EOSINOPHIL # 1.00 (*)     All other components within normal limits   TROPONIN-I - Normal   PT/INR - Normal    Narrative:     INR OF 2.0-3.0  RECOMMENDED FOR: PROPHYLAXIS/TREATMENT OF VENEOUS THROMBOSIS, PULMONARY EMBOLISM, PREVENTION OF SYSTEMIC EMBOLISM FROM ATRIAL FIBRILATION, MYOCARDIAL INFARCTION.    INR OF 2.5-3.5  RECOMMENDED FOR MECHANICAL PROSTHETIC HEART VALVES, RECURRENT SYSTEMIC EMBOLISM, RECURRENT MYOCARDIAL INFARCTION.     PTT (PARTIAL THROMBOPLASTIN TIME) - Normal   B-TYPE NATRIURETIC PEPTIDE - Normal    Narrative:                                 Class 1: 101-250 pg/mL                              Class 2: 251-550 pg/mL                              Class 3: 551-900 pg/mL                              Class 4: >901 pg/mL     The [employer] Association has developed a four-stage functional classification system for CHF that is based on a subjective interpretation of the severity  of a patient's clinical signs and symptoms.    Class 1 - Patients have no limitations on physical activity and have no symptoms with ordinary physical activity.    Class 2 - Patients have a slight limitation of physical activity and have symptoms with ordinary physical activity.    Class 3 - Patients have a marked limitation of physical activity and have symptoms with less than ordinary physical activity, but not at rest.    Class 4 - Patients are unable to perform any physical activity without discomfort.   TROPONIN-I - Normal   COVID-19, FLU A/B, RSV RAPID BY PCR - Normal    Narrative:     Results are for the simultaneous qualitative identification of SARS-CoV-2 (formerly 2019-nCoV), Influenza A, Influenza B, and RSV RNA. These etiologic agents are generally detectable in nasopharyngeal and nasal swabs during the ACUTE PHASE of infection. Hence, this test is intended to be performed on respiratory specimens collected from individuals with signs and symptoms of upper respiratory tract infection who meet Centers for Disease Control and Prevention (CDC) clinical and/or epidemiological criteria for Coronavirus Disease 2019 (COVID-19) testing. CDC COVID-19 criteria for testing on human specimens is available at [facility] webpage information for Healthcare Professionals: Coronavirus Disease 2019 (COVID-19) ([employer]).     False-negative results may occur if the virus has genomic mutations, insertions, deletions, or rearrangements or if performed very early in the course of illness. Otherwise, negative results indicate virus specific RNA targets are not detected, however negative results do not preclude SARS-CoV-2 infection/COVID-19, Influenza, or Respiratory syncytial virus infection. Results should not be used as the sole basis for patient management decisions. Negative results must be combined with clinical observations, patient history, and epidemiological information. If  upper respiratory tract infection is still suspected based on  exposure history together with other clinical findings, re-testing should be considered.    Disclaimer:   This assay has been authorized by FDA under an Emergency Use Authorization for use in laboratories certified under the Clinical Laboratory Improvement Amendments of 1988 (CLIA), 42 U.S.C. 2364415008, to perform high complexity tests. The impacts of vaccines, antiviral therapeutics, antibiotics, chemotherapeutic or immunosuppressant drugs have not been evaluated.     Test methodology:   Cepheid Xpert Xpress SARS-CoV-2/Flu/RSV Assay real-time polymerase chain reaction (RT-PCR) test on the GeneXpert Dx and Xpert Xpress systems.   CBC/DIFF    Narrative:     The following orders were created for panel order CBC/DIFF.  Procedure                               Abnormality         Status                     ---------                               -----------         ------                     CBC WITH PRXY[[id number]]                Abnormal            Final result                 Please view results for these tests on the individual orders.   TROPONIN-I     XR AP MOBILE CHEST   Final Result by Edi, Radresults In (01/26 1041)   NO ACUTE FINDINGS.         Radiologist location ID: [place] Decision Making        Medical Decision Making  Patient 77 year old female to the emergency room with history of asthma complaining of wheezing, shortness of breath.  Patient took albuterol prior to arrival some relief.  Differential diagnosis include but are not limited to asthma attack, COVID flu RSV, cardiac etiology.  On physical examination waiting wheezing noted in bilateral upper lobes.  Chest x-ray within normal limits.  Initial 1 hour troponins within normal limits.  Patient given DuoNeb and albuterol and has complete relief as well as Solu-Medrol IV.  After treatment, patient states she feels completely better.  COVID flu RSV negative.  No further  pertinent findings noted.  Patient discharged with asthma exacerbation to continue albuterol and placed on prednisone to return to ED if worsening symptoms otherwise follow up with PCP.      Amount and/or Complexity of Data Reviewed  Labs: ordered.  Radiology: ordered.  ECG/medicine tests: ordered.    Risk  OTC drugs.  Prescription drug management.             Medications Administered in the ED   aspirin chewable tablet 324 mg (324 mg Oral Given 08/30/22 1056)   ipratropium-albuterol 0.5 mg-3 mg(2.5 mg base)/3 mL Solution for Nebulization (3 mL Nebulization Given 08/30/22 1056)   methylPREDNISolone sod succ (SOLU-medrol) 125 mg/2 mL injection (125 mg Intravenous Given 08/30/22 1113)   albuterol (PROVENTIL) 2.5 mg / 3 mL (0.083%) neb solution (2.5 mg Nebulization Given 08/30/22 1159)  Clinical Impression   Exacerbation of asthma, unspecified asthma severity, unspecified whether persistent (Primary)       Disposition: Discharged

## 2022-08-30 NOTE — ED Nurses Note (Signed)
Patient discharged home with family.  AVS reviewed with patient/care giver.  A written copy of the AVS and discharge instructions was given to the patient/care giver.  Questions sufficiently answered as needed.  Patient/care giver encouraged to follow up with PCP as indicated.  In the event of an emergency, patient/care giver instructed to call 911 or go to the nearest emergency room.

## 2022-08-30 NOTE — ED Triage Notes (Signed)
CP that started Wednesday with SOB. Patient reports pain moving to her left arm. Patient denies N/V/D.

## 2022-09-02 DIAGNOSIS — R079 Chest pain, unspecified: Secondary | ICD-10-CM

## 2022-09-02 LAB — ECG 12 LEAD
Atrial Rate: 100 {beats}/min
Calculated P Axis: 56 degrees
Calculated R Axis: 78 degrees
Calculated T Axis: 77 degrees
PR Interval: 150 ms
QRS Duration: 76 ms
QT Interval: 344 ms
QTC Calculation: 443 ms
Ventricular rate: 100 {beats}/min

## 2022-09-11 ENCOUNTER — Encounter (INDEPENDENT_AMBULATORY_CARE_PROVIDER_SITE_OTHER): Payer: Self-pay | Admitting: Nephrology

## 2022-09-11 ENCOUNTER — Ambulatory Visit (INDEPENDENT_AMBULATORY_CARE_PROVIDER_SITE_OTHER): Payer: Commercial Managed Care - PPO | Admitting: Nephrology

## 2022-09-11 ENCOUNTER — Other Ambulatory Visit: Payer: Self-pay

## 2022-09-11 VITALS — BP 150/75 | HR 94 | Ht 62.0 in | Wt 202.4 lb

## 2022-09-11 DIAGNOSIS — E538 Deficiency of other specified B group vitamins: Secondary | ICD-10-CM | POA: Insufficient documentation

## 2022-09-11 DIAGNOSIS — N183 Chronic kidney disease, stage 3 unspecified (CMS HCC): Secondary | ICD-10-CM | POA: Insufficient documentation

## 2022-09-11 DIAGNOSIS — D631 Anemia in chronic kidney disease: Secondary | ICD-10-CM | POA: Insufficient documentation

## 2022-09-11 DIAGNOSIS — N184 Chronic kidney disease, stage 4 (severe): Secondary | ICD-10-CM

## 2022-09-11 DIAGNOSIS — F319 Bipolar disorder, unspecified: Secondary | ICD-10-CM

## 2022-09-11 DIAGNOSIS — E559 Vitamin D deficiency, unspecified: Secondary | ICD-10-CM | POA: Insufficient documentation

## 2022-09-11 DIAGNOSIS — I872 Venous insufficiency (chronic) (peripheral): Secondary | ICD-10-CM | POA: Insufficient documentation

## 2022-09-11 DIAGNOSIS — R600 Localized edema: Secondary | ICD-10-CM

## 2022-09-11 DIAGNOSIS — Z791 Long term (current) use of non-steroidal anti-inflammatories (NSAID): Secondary | ICD-10-CM

## 2022-09-11 DIAGNOSIS — N189 Chronic kidney disease, unspecified: Secondary | ICD-10-CM

## 2022-09-11 NOTE — Progress Notes (Signed)
[facility]    Progress Note    Name: Paula Cochran MRN:  [id number]   Date: 09/11/2022 Age: 77 y.o.          Nephrology New Patient Visit       Reason for the visit/consultation:  CKD 4  HPI : 77 y.o. white female patient history of bipolar disorder on lithium for many years also has swelling in her legs did not see Heart Dr. we do not know her ejection fraction she used to take some ibuprofen but she stopped few years back had creatinine ranging from 1.6-1.8 CKD between stage IIIB and 4 at this point discussed with the patient she has swollen legs I will start her on Lasix 40 mg p.o. once a day check kidney ultrasound needs an echo to see Cardiology to evaluate her left ventricular ejection fraction and we need to discuss with her psychiatrist about her lithium check PTH anemia profile kidney ultrasound and we need to check lithium level    Lab work from other lab:   ROS:     Systematic review of 12 organ systems was negative except what mentioned in in the HPI.     OBJECTIVE:   BP (!) 150/75   Pulse 94   Ht 1.575 m ('5\' 2"'$ )   Wt 91.8 kg (202 lb 6.4 oz)   BMI 37.02 kg/m       General:  NAD, AAOx3  HEENT:  EOMI,  no icterus  NECK: No increased JVD.  Normal inspection   HEART: Normal S1 and S2. Regular rhythm. No murmurs or rubs. No chest wall tenderness   LUNGS: Clear to auscultation bilateral. No wheezes, rales, or rhonchi.   ABDOMEN: +BS, Soft, nontender and nondistended. No rebound or guarding present.   EXTREMITIES:  +2 edema. No cyanosis or clubbing.No asterixis    NEURO : Normal speech, EOMI.       LABORATORY DATA:   Lab Results   Component Value Date    BUN 24 08/30/2022    BUN 25 11/10/2021    BUN 22 (H) 11/08/2021    CREATININE 1.67 (H) 08/30/2022    CREATININE 1.74 (H) 11/10/2021    CREATININE 1.89 (H) 11/08/2021    BUNCRRATIO 14 08/30/2022    BUNCRRATIO 14 11/10/2021    BUNCRRATIO 12 11/08/2021    GFR 32 (L) 08/30/2022    GFR 30 (L) 11/10/2021    GFR 28 (L)  11/08/2021    SODIUM 142 08/30/2022    SODIUM 140 11/10/2021    SODIUM 138 11/08/2021    POTASSIUM 4.9 08/30/2022    POTASSIUM 4.0 11/10/2021    POTASSIUM 4.2 11/08/2021    CHLORIDE 112 (H) 08/30/2022    CHLORIDE 108 (H) 11/10/2021    CHLORIDE 104 11/08/2021    CO2 25 08/30/2022    CO2 27 11/10/2021    CO2 29 11/08/2021    ANIONGAP 5 08/30/2022    ANIONGAP 5 (L) 11/10/2021    ANIONGAP 5 (L) 11/08/2021    CALCIUM 9.5 08/30/2022    CALCIUM 9.4 11/10/2021    CALCIUM 9.7 11/08/2021    ALBUMIN 4.2 08/30/2022    ALBUMIN 4.0 11/10/2021    ALBUMIN 3.5 11/08/2021    HGB 15.5 (H) 08/30/2022    HGB 15.2 11/10/2021    HGB 15.5 11/08/2021    HCT 47.3 (H) 08/30/2022    HCT 44.6 11/10/2021    HCT 45.0 11/08/2021       No  results found for: "MICROALBUMIN", "TOTPROTCREAT", "HA1C", "URICACID"    MEDICATIONS:  No outpatient medications have been marked as taking for the 09/11/22 encounter (Office Visit) with Richardean Sale, MD.       ASSESSMENT / PLAN:   ENCOUNTER DIAGNOSES     ICD-10-CM   1. CKD (chronic kidney disease) stage 4, GFR 15-29 ml/min (CMS HCC)  N18.4   2. Vitamin D deficiency  E55.9   3. Vitamin B12 deficiency  E53.8   4. Anemia of chronic renal failure, unspecified CKD stage  N18.9    D63.1   5. 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 once a day  Vitamin-D vitamin B12 deficiency check levels continue supplements  Anemia chronic kidney  disease check anemia profile and PTH  Richardean Sale, MD

## 2022-09-18 ENCOUNTER — Ambulatory Visit (INDEPENDENT_AMBULATORY_CARE_PROVIDER_SITE_OTHER): Payer: Self-pay | Admitting: Nephrology

## 2022-09-30 ENCOUNTER — Other Ambulatory Visit: Payer: Self-pay

## 2022-09-30 ENCOUNTER — Inpatient Hospital Stay
Admission: RE | Admit: 2022-09-30 | Discharge: 2022-09-30 | Disposition: A | Discharge status: Home / Self Care | Payer: Commercial Managed Care - PPO | Source: Ambulatory Visit | Attending: Nephrology | Admitting: Nephrology

## 2022-09-30 DIAGNOSIS — N184 Chronic kidney disease, stage 4 (severe): Secondary | ICD-10-CM | POA: Insufficient documentation

## 2022-10-01 ENCOUNTER — Other Ambulatory Visit (INDEPENDENT_AMBULATORY_CARE_PROVIDER_SITE_OTHER): Payer: Self-pay | Admitting: Nephrology

## 2022-10-01 DIAGNOSIS — N184 Chronic kidney disease, stage 4 (severe): Secondary | ICD-10-CM

## 2022-10-03 ENCOUNTER — Ambulatory Visit (INDEPENDENT_AMBULATORY_CARE_PROVIDER_SITE_OTHER): Payer: Commercial Managed Care - PPO

## 2022-10-03 ENCOUNTER — Other Ambulatory Visit: Payer: Commercial Managed Care - PPO | Attending: Nephrology | Admitting: Nephrology

## 2022-10-03 ENCOUNTER — Other Ambulatory Visit: Payer: Self-pay

## 2022-10-03 DIAGNOSIS — E538 Deficiency of other specified B group vitamins: Secondary | ICD-10-CM

## 2022-10-03 DIAGNOSIS — N184 Chronic kidney disease, stage 4 (severe): Secondary | ICD-10-CM

## 2022-10-03 DIAGNOSIS — D631 Anemia in chronic kidney disease: Secondary | ICD-10-CM

## 2022-10-03 DIAGNOSIS — N189 Chronic kidney disease, unspecified: Secondary | ICD-10-CM | POA: Insufficient documentation

## 2022-10-03 DIAGNOSIS — E559 Vitamin D deficiency, unspecified: Secondary | ICD-10-CM | POA: Insufficient documentation

## 2022-10-03 LAB — URIC ACID: URIC ACID: 8.2 mg/dL — ABNORMAL HIGH (ref 2.3–7.6)

## 2022-10-03 LAB — COMPREHENSIVE METABOLIC PANEL, NON-FASTING
ALBUMIN/GLOBULIN RATIO: 1.5 — ABNORMAL HIGH (ref 0.8–1.4)
ALBUMIN: 4 g/dL (ref 3.5–5.7)
ALKALINE PHOSPHATASE: 109 U/L — ABNORMAL HIGH (ref 34–104)
ALT (SGPT): 11 U/L (ref 7–52)
ANION GAP: 6 mmol/L (ref 4–13)
AST (SGOT): 21 U/L (ref 13–39)
BILIRUBIN TOTAL: 0.6 mg/dL (ref 0.3–1.2)
BUN/CREA RATIO: 16 (ref 6–22)
BUN: 29 mg/dL — ABNORMAL HIGH (ref 7–25)
CALCIUM, CORRECTED: 9.6 mg/dL (ref 8.9–10.8)
CALCIUM: 9.6 mg/dL (ref 8.6–10.3)
CHLORIDE: 110 mmol/L — ABNORMAL HIGH (ref 98–107)
CO2 TOTAL: 25 mmol/L (ref 21–31)
CREATININE: 1.83 mg/dL — ABNORMAL HIGH (ref 0.60–1.30)
ESTIMATED GFR: 28 mL/min/{1.73_m2} — ABNORMAL LOW (ref >59)
GLOBULIN: 2.6 — ABNORMAL LOW (ref 2.9–5.4)
GLUCOSE: 93 mg/dL (ref 74–109)
OSMOLALITY, CALCULATED: 287 mOsm/kg (ref 270–290)
POTASSIUM: 4.4 mmol/L (ref 3.5–5.1)
PROTEIN TOTAL: 6.6 g/dL (ref 6.4–8.9)
SODIUM: 141 mmol/L (ref 136–145)

## 2022-10-03 LAB — IRON TRANSFERRIN AND TIBC
IRON (TRANSFERRIN) SATURATION: 63 % — ABNORMAL HIGH (ref 15–50)
IRON: 169 ug/dL (ref 50–212)
TOTAL IRON BINDING CAPACITY: 270 ug/dL (ref 250–450)
TRANSFERRIN: 193 mg/dL — ABNORMAL LOW (ref 203–362)
UIBC: 101 ug/dL — ABNORMAL LOW (ref 130–375)

## 2022-10-03 LAB — PARATHYROID HORMONE (PTH): PTH: 127.1 pg/mL — ABNORMAL HIGH (ref 12.0–88.0)

## 2022-10-03 LAB — MICROALBUMIN URINE, RANDOM: MICROALBUMIN RANDOM URINE: <0.7 mg/dL

## 2022-10-03 LAB — CBC WITH DIFF
BASOPHIL #: 0 10*3/uL (ref 0.00–0.10)
BASOPHIL %: 1 % (ref 0–1)
EOSINOPHIL #: 0.5 10*3/uL (ref 0.00–0.50)
EOSINOPHIL %: 7 %
HCT: 43.6 % — ABNORMAL HIGH (ref 31.2–41.9)
HGB: 14.7 g/dL — ABNORMAL HIGH (ref 10.9–14.3)
LYMPHOCYTE #: 2.5 10*3/uL (ref 1.00–3.00)
LYMPHOCYTE %: 31 % (ref 16–44)
MCH: 32.7 pg (ref 24.7–32.8)
MCHC: 33.6 g/dL (ref 32.3–35.6)
MCV: 97.3 fL — ABNORMAL HIGH (ref 75.5–95.3)
MONOCYTE #: 0.6 10*3/uL (ref 0.30–1.00)
MONOCYTE %: 8 % (ref 5–13)
MPV: 9.8 fL (ref 7.9–10.8)
NEUTROPHIL #: 4.4 10*3/uL (ref 1.85–7.80)
NEUTROPHIL %: 54 % (ref 43–77)
PLATELETS: 255 10*3/uL (ref 140–440)
RBC: 4.48 10*6/uL (ref 3.63–4.92)
RDW: 13 % (ref 12.3–17.7)
WBC: 8.1 10*3/uL (ref 3.8–11.8)

## 2022-10-03 LAB — FERRITIN: FERRITIN: 68 ng/mL (ref 11–336)

## 2022-10-03 LAB — VITAMIN D 25 TOTAL: VITAMIN D: 57 ng/mL (ref 30–100)

## 2022-10-03 LAB — LITHIUM LEVEL: LITHIUM LEVEL: 0.43 mmol/L — ABNORMAL LOW (ref 1.00–1.20)

## 2022-10-03 LAB — FOLATE: FOLATE: 19.3 ng/mL (ref 5.9–24.4)

## 2022-10-03 LAB — VITAMIN B12: VITAMIN B 12: 178 pg/mL — ABNORMAL LOW (ref 180–914)

## 2022-10-03 LAB — PHOSPHORUS: PHOSPHORUS: 3.6 mg/dL — ABNORMAL LOW (ref 3.7–7.2)

## 2022-10-03 LAB — MAGNESIUM: MAGNESIUM: 2.4 mg/dL (ref 1.9–2.7)

## 2022-10-10 ENCOUNTER — Encounter (INDEPENDENT_AMBULATORY_CARE_PROVIDER_SITE_OTHER): Payer: Self-pay | Admitting: Nephrology

## 2022-10-10 ENCOUNTER — Other Ambulatory Visit: Payer: Self-pay

## 2022-10-10 ENCOUNTER — Ambulatory Visit (INDEPENDENT_AMBULATORY_CARE_PROVIDER_SITE_OTHER): Payer: Commercial Managed Care - PPO | Admitting: Nephrology

## 2022-10-10 ENCOUNTER — Ambulatory Visit (INDEPENDENT_AMBULATORY_CARE_PROVIDER_SITE_OTHER): Payer: Commercial Managed Care - PPO | Admitting: Student in an Organized Health Care Education/Training Program

## 2022-10-10 VITALS — BP 126/55 | HR 84 | Ht 63.0 in | Wt 192.0 lb

## 2022-10-10 VITALS — BP 154/95 | HR 88 | Ht 63.0 in | Wt 191.0 lb

## 2022-10-10 DIAGNOSIS — N2581 Secondary hyperparathyroidism of renal origin: Secondary | ICD-10-CM

## 2022-10-10 DIAGNOSIS — D631 Anemia in chronic kidney disease: Secondary | ICD-10-CM

## 2022-10-10 DIAGNOSIS — N281 Cyst of kidney, acquired: Secondary | ICD-10-CM

## 2022-10-10 DIAGNOSIS — N184 Chronic kidney disease, stage 4 (severe): Secondary | ICD-10-CM

## 2022-10-10 DIAGNOSIS — N2889 Other specified disorders of kidney and ureter: Secondary | ICD-10-CM

## 2022-10-10 DIAGNOSIS — E559 Vitamin D deficiency, unspecified: Secondary | ICD-10-CM

## 2022-10-10 DIAGNOSIS — E790 Hyperuricemia without signs of inflammatory arthritis and tophaceous disease: Secondary | ICD-10-CM

## 2022-10-10 DIAGNOSIS — I872 Venous insufficiency (chronic) (peripheral): Secondary | ICD-10-CM

## 2022-10-10 DIAGNOSIS — F319 Bipolar disorder, unspecified: Secondary | ICD-10-CM

## 2022-10-10 DIAGNOSIS — E538 Deficiency of other specified B group vitamins: Secondary | ICD-10-CM

## 2022-10-10 DIAGNOSIS — D49512 Neoplasm of unspecified behavior of left kidney: Secondary | ICD-10-CM

## 2022-10-10 MED ORDER — ALLOPURINOL 100 MG TABLET
100.0000 mg | ORAL_TABLET | Freq: Every day | ORAL | 3 refills | Status: DC
Start: 2022-10-10 — End: 2023-04-30

## 2022-10-10 MED ORDER — CALCITRIOL 0.25 MCG CAPSULE
0.2500 ug | ORAL_CAPSULE | Freq: Every day | ORAL | 0 refills | Status: AC
Start: 2022-10-10 — End: 2023-01-08

## 2022-10-10 NOTE — Progress Notes (Signed)
NE[facility]ROLOGY, [facility]  [address]  [place] [address]       Name: Paula Cochran MRN:  [id number]   Date of Birth: 1946/02/22 Age: 77 y.o.   Date: 10/10/2022  Time: 13:35       Nephrology Office Note    Reason for visit: Follow Up (F/u  labs)      History of Present Illness:  Paula Cochran is a 77 y.o. female presenting with 77 y.o. white female patient history of bipolar disorder on lithium for many years also has swelling in her legs did not see Heart Dr. we do not know her ejection fraction she used to take some ibuprofen but she stopped few years back had creatinine ranging from 1.6-1.8 CKD between stage IIIB and 4 at this point discussed with the patient she has swollen legs I will start her on Lasix 40 mg p.o. once a day check kidney ultrasound needs an echo to see Cardiology to evaluate her left ventricular ejection fraction and we need to discuss with her psychiatrist about her lithium check PTH anemia profile kidney ultrasound and we need to check lithium level   10-10-2022  Nephrology Clinic follow-up visit on this patient with CKD stage 4 kidney ultrasound showed a mass.  In the left kidney seen by Urology and in his note no biopsy needed repeat ultrasound in 6 months lab work showed hemoglobin 14.7 creatinine 1.8 GFR 28 stage IV CKD uric acid 8.2 she will need allopurinol 100 mg once a day iron saturation 63% discontinue iron supplements PTH 127 needs calcitriol 0.25 mcg once a day per lithium level is 0.4 within therapeutic range folic acid within normal vitamin B12 is 178 need vitamin B12 supplements vitamin-D within normal drink plenty of fluids avoid NSAIDs keep sugar blood pressure control repeat lab work see her back in 6 months need to discuss with her psychiatrist regarding lithium if there is any alternative to discontinue lithium I will leave this for psychiatrist    Past Medical History:  Past Medical History:   Diagnosis Date    Arthropathy     Asthma     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    Wears glasses          Past Surgical History:  Past Surgical History:   Procedure Laterality Date    HX APPENDECTOMY      HX DILATION AND CURETTAGE      HX HYSTERECTOMY           Allergies:  Allergies   Allergen Reactions    Amoxicillin Anaphylaxis    Adhesive Rash, Itching and Hives/ Urticaria    Nickel Rash and Itching     Medications:  Current Outpatient Medications   Medication Sig    albuterol sulfate (PROVENTIL OR VENTOLIN OR PROAIR) 90 mcg/actuation Inhalation oral inhaler Take 1-2 Puffs by inhalation Every 6 hours as needed    allopurinoL (ZYLOPRIM) 100 mg Oral Tablet Take 1 Tablet (100 mg total) by mouth Once a day for 360 days    calcitrioL (ROCALTROL) 0.25 mcg Oral Capsule Take 1 Capsule (0.25 mcg total) by mouth Once a day for 90 days 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    cyanocobalamin (VITAMIN B12) 1,000 mcg/mL Injection Solution INJECT 1 MILLILITER INTO THE MUSCLE ONCE EVERY MONTH    DULoxetine (CYMBALTA DR) 30 mg Oral Capsule, Delayed Release(E.C.) Take 1 Capsule (30 mg total) by mouth Once a day  ergocalciferol, vitamin D2, (DRISDOL) 1,250 mcg (50,000 unit) Oral Capsule Take 1 Capsule (50,000 Units total) by mouth Every 7 days    furosemide (LASIX) 40 mg Oral Tablet     lamoTRIgine (LAMICTAL) 25 mg Oral Tablet TAKE 1 TABLET BY MOUTH DAILY FOR 2 WEEKS THEN TAKE 2 TABLETS BY MOUTH DAILY    lithium carbonate (ESKALITH) 150 mg Oral Capsule Take 1 Capsule (150 mg total) by mouth Once a day    LORazepam (ATIVAN) 1 mg Oral Tablet Take 1 Tablet (1 mg total) by mouth Twice per day as needed for Anxiety     Family History:  Family Medical History:    None         Social History:  Social History     Socioeconomic History    Marital status: Married   Tobacco Use    Smoking status: Every Day     Current packs/day: 0.25     Average packs/day: 0.3 packs/day for 20.0 years (5.0 ttl pk-yrs)     Types: Cigarettes    Smokeless tobacco: Never   Vaping Use     Vaping status: Never Used   Substance and Sexual Activity    Alcohol use: Never    Drug use: Never    Sexual activity: Yes       Review of Systems:  Constitutional: negative for fevers, chills, sweats, and fatigue  Eyes: negative for visual disturbance, irritation, redness, and icterus  Ears, nose, mouth, throat, and face: negative for hearing loss, ear drainage, nasal congestion, epistaxis  Respiratory: negative for cough, sputum, hemoptysis, wheezing, or dyspnea on exertion  Cardiovascular: negative for chest pain, palpitations, syncope, orthopnea, paroxysmal nocturnal dyspnea, and   Gastrointestinal: negative for dysphagia, nausea, vomiting, melena, diarrhea, constipation, and abdominal pain  Genitourinary:negative for frequency, dysuria, nocturia, urinary incontinence, hesitancy, and hematuria  Integument/breast: negative for rash, skin lesion(s), and pruritus  Hematologic/lymphatic: negative for easy bruising, bleeding, and petechiae  Musculoskeletal:negative for myalgias, arthralgias, neck pain, back pain, no lower extremity edema, and muscle weakness  Neurological: negative for headaches, dizziness, seizures, speech problems, tremor, and weakness  Behavioral/Psych: negative for anxiety, behavior problems, mood swings, and sleep disturbance  Endocrine: negative for temperature intolerance  Allergic/Immunologic: negative for urticaria and angioedema    Physical Exam:  Vitals:    10/10/22 1318   BP: (!) 126/55   Pulse: 84   Weight: 87.1 kg (192 lb)   Height: 1.6 m (5\' 3" )   BMI: 34.08      Constitutional: Alert and Oriented, no distress,   HEENT : normocephalic , atraumatic vision and hearing grossly normal   Eyes: Conjunctiva clear., Pupils equal and round, reactive to light and accomodation. , Sclera non-icteric.   ENT: Nose without erythema. , Mouth mucous membranes moist.   Neck: no thyromegaly or lymphadenopathy and supple, symmetrical, trachea midline  Respiratory: Clear to auscultation bilaterally. No  wheezes, No rales, Good air exchange bilaterally  Cardiovascular: regular rate and rhythm no murmurs no rub  Gastrointestinal: Soft, non-tender, Bowel sounds normal, No hepatosplenomegaly  Genitourinary: no suprapubic tenderness  Lower Extremities: No edema, Pulses +4  Neurologic: Grossly normal. Speech clear.   Psychiatric: Normal mood and affect     Lab:  Lab Results   Component Value Date    BUN 29 (H) 10/03/2022    BUN 24 08/30/2022    BUN 25 11/10/2021    CREATININE 1.83 (H) 10/03/2022    CREATININE 1.67 (H) 08/30/2022    CREATININE 1.74 (H) 11/10/2021  BUNCRRATIO 16 10/03/2022    BUNCRRATIO 14 08/30/2022    BUNCRRATIO 14 11/10/2021    GFR 28 (L) 10/03/2022    GFR 32 (L) 08/30/2022    GFR 30 (L) 11/10/2021    SODIUM 141 10/03/2022    SODIUM 142 08/30/2022    SODIUM 140 11/10/2021    POTASSIUM 4.4 10/03/2022    POTASSIUM 4.9 08/30/2022    POTASSIUM 4.0 11/10/2021    CHLORIDE 110 (H) 10/03/2022    CHLORIDE 112 (H) 08/30/2022    CHLORIDE 108 (H) 11/10/2021    CO2 25 10/03/2022    CO2 25 08/30/2022    CO2 27 11/10/2021    ANIONGAP 6 10/03/2022    ANIONGAP 5 08/30/2022    ANIONGAP 5 (L) 11/10/2021    CALCIUM 9.6 10/03/2022    CALCIUM 9.5 08/30/2022    CALCIUM 9.4 11/10/2021    PHOSPHORUS 3.6 (L) 10/03/2022    ALBUMIN 4.0 10/03/2022    ALBUMIN 4.2 08/30/2022    ALBUMIN 4.0 11/10/2021    HGB 14.7 (H) 10/03/2022    HGB 15.5 (H) 08/30/2022    HGB 15.2 11/10/2021    HCT 43.6 (H) 10/03/2022    HCT 47.3 (H) 08/30/2022    HCT 44.6 11/10/2021    INTACTPTH 127.1 (H) 10/03/2022    IRON 169 10/03/2022    IRONBINDCAP 270 10/03/2022    IRONSAT 63 (H) 10/03/2022    FERRITIN 68 10/03/2022       Lab Results   Component Value Date    URICACID 8.2 (H) 10/03/2022        Assessment and Plan:  ENCOUNTER DIAGNOSES     ICD-10-CM   1. CKD (chronic kidney disease) stage 4, GFR 15-29 ml/min (CMS HCC)  N18.4   2. Anemia of chronic renal failure, unspecified CKD stage  N18.9    D63.1   3. Vitamin B12 deficiency  E53.8   4. Vitamin D  deficiency  E55.9   5. 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 Secondary hyperparathyroidism of renal origin (CMS [place])  N25.81        Plan:  CKD stage 4 most likely secondary to lithium toxicity although lithium level within normal she has been on it for many years patient need to discuss this with her psychiatrist try to change her lithium to something else for now drink plenty of fluids avoid NSAIDs keep blood pressure control repeat lab work see her back in 6 months  Left kidney mass seen by Urology to follow up with Urology repeat ultrasound in 6 months  Vitamin B12 deficiency will start vitamin B12 supplements  Vitamin-D deficiency levels are normal continue supplements  Hyperuricemia needs allopurinol 100 mg once a day  Secondary hyperparathyroidism start Rocaltrol 0.25 mcg once a day                   No follow-ups on file.    Richardean Sale, MD     Portions of this note may be dictated using voice recognition software or a dictation service. Variances in spelling and vocabulary are possible and unintentional. Not all errors are caught/corrected. Please notify the [facility] if any discrepancies are noted or if the meaning of any statement is not clear. ,b

## 2022-10-10 NOTE — Progress Notes (Signed)
[place], [facility]    Progress Note    Name: Paula Cochran MRN:  [id number]   Date: 10/10/2022 Age: 77 y.o.       Chief Complaint: New Patient (Left kidney 1.2 cm midpole mass 1.28 midpole mass [place] 09/11/2022)    Subjective:   Paula Cochran is a pleasant 77 year old female with a history of chronic kidney disease stage 4.  In evaluation of her CKD she recently underwent a renal ultrasound.  This demonstrated a left renal cyst measuring roughly 2 cm as well as a left renal lesion measuring 2.8 cm.  She has a remote smoking history for about 10 years with about 5 cigarettes per day.  She quit about 30 years ago.  She denies occupational chemical exposure history.  She denies a personal or family history of urologic malignancy.  She denies a personal history of malignancy. She denies fevers, chills, nausea, vomiting, hematuria, dysuria, flank pain, incontinence, dribbling, hesitancy, suprapubic pain, headaches, vision changes, shortness of breath, chest pain.    Objective :  BP (!) 154/95 (Site: Right, Patient Position: Sitting, Cuff Size: Adult)   Pulse 88   Ht 1.6 m (5\' 3" )   Wt 86.6 kg (191 lb)   BMI 33.83 kg/m       Gen: NAD, alert  Pulm: unlabored at rest  CV: palpable pulses  Abd: soft, Nt/ND  GU: no suprapubic tenderness, no CVAT    Data reviewed:    Current Outpatient Medications   Medication Sig    albuterol sulfate (PROVENTIL OR VENTOLIN OR PROAIR) 90 mcg/actuation Inhalation oral inhaler Take 1-2 Puffs by inhalation Every 6 hours as needed    DULoxetine (CYMBALTA DR) 30 mg Oral Capsule, Delayed Release(E.C.) Take 1 Capsule (30 mg total) by mouth Once a day    ergocalciferol, vitamin D2, (DRISDOL) 1,250 mcg (50,000 unit) Oral Capsule Take 1 Capsule (50,000 Units total) by mouth Every 7 days    lithium carbonate (ESKALITH) 150 mg Oral Capsule Take 1 Capsule (150 mg total) by mouth Once a day    LORazepam (ATIVAN) 1 mg Oral Tablet Take 1 Tablet (1 mg total) by mouth Twice per day  as needed for Anxiety     Assessment/Plan  Problem List Items Addressed This Visit          Nephrology    CKD (chronic kidney disease) stage 4, GFR 15-29 ml/min (CMS HCC)       Left renal cyst, 2cm  I had a very lengthy discussion with patient regarding the characteristics, nature and work-up of their renal cyst, as well as the rationale, implications and alternatives of the various management options.  We spent considerable time personally reviewing relevant imaging which are consistent with a incompletely characterized renal cyst  I discussed the nature of incompletely characterized renal cyst warranting further dedicated imaging for further lesion characterization  I discussed the nature of simple appearing Bosniak I renal cyst which have been reported to affect more than 50% of patients over the age of 104 and warrant no further workup, surveillance or intervention if asymptomatic; however, repeat ultrasonography may be considered at 6 to 12 months in selected patients in order to confirm stability and a correct diagnosis  I discussed the nature of minimally complex Bosniak II renal cyst which pose limited oncologic potential and generally do not warrant further radiographic surveillance; however, repeat ultrasonography may be considered at 6 to 12 months in selected patients in order to confirm stability  and a correct diagnosis  I discussed the nature of complex Bosniak IIF renal cyst which has a reported incidence of malignancy ranging from 5-20% and accordingly warrants further radiographic surveillance  I discussed the nature of complex Bosniak III renal cyst which carries nearly a 50% risk of malignancy and warrants surgical extirpation  I discussed the nature of complex Bosniak IV renal cyst which carries nearly a 90% risk of malignancy and warrants surgical extirpation  Based on patient's clinical and radiographic findings, I would recommend renal ultrasound in 6 months  Generally the indications for  surgical intervention for renal cysts include suspected malignancy, pain attributable to cyst expansion, cyst infection and angiotensin-dependent hypertension.  We also discussed the limited role of percutaneous renal fine-needle aspiration (FNA) biopsy; unfortunately, biopsy cannot reliably distinguish a benign renal cyst from the less common cystic renal cell carcinoma.  Preoperative needle biopsy has generally not been recommended for resectable renal lesions, because of concern about seeding of the peritoneum.      Left renal lesion, 2.8cm   I had a very lengthy discussion with patient where I reviewed in detail recent imaging including renal ultrasound. Importantly, the patient's renal cell carcinoma-specific risk factors include: hypertension, obesity, previous nicotine dependence      Patient was educated and counseled on the incidence, nature and risk factors for renal cell carcinoma; notably, I stressed the 80-90% probability that a solid mass of this nature and imaging characteristics represents malignancy. Treatment options were discussed including observation, percutaneous biopsy, radical nephrectomy (open v. laparoscopic), partial nephrectomy (open v. laparoscopic), thermal ablation (open v. laparoscopic v. percutaneous). The risks, advantages, and disadvantages of each approach were discussed in detail.     We briefly discussed the role of percutaneous, image-guided renal mass biopsy in patients in whom it might impact management, particularly in patients with clinical or radiographic findings suggestive of lymphoma, abscess or metastasis.  We also discussed the limitations of renal mass biopsy including the non-diagnostic yield and hybrid-type tumors which may lend a false sense of reassurance of a perceived indolent tumor such as oncocytoma.  Additionally, renal mass biopsy is not without risks with well-documented risks of retroperitoneal hemorrhage and possible, albeit rare, biopsy tract  seeding.    For clinical T1a renal masses (<4.0 cm) in an otherwise health patient without coexistant medical comorbidities or baseline chronic kidney disease, complete surgical excision achieved by either partial nephrectomy or radical nephrectomy when nephron sparing surgery is not technically feasible are considered the standard of care per the 2009 American Urological Association "Guideline for the Management of the Clinical Stage 1 Renal Mass".  Partial nephrectomy, if possible, is recommended when the need exists to preserve renal function, however, can be associated with increased urologic morbidity including higher risk of hemorrhage and urinary fistula.    For clinical T1b+ (>4 cm) renal masses in an otherwise healthy patient, both radical nephrectomy in the setting of a normal contralateral kidney or partial nephrectomy when the need exists to preserve renal function are considered standards, each given equal weight per the 2009 American Urological Association "Guideline for the Management of the Clinical Stage 1 Renal Mass".  I spent time discussing with patient that the overwhelming majority of studies evaluating partial nephrectomy in higher stage (cT1b+) are observational, retrospective, reported findings on samples of convenience that were not randomized to treatments and involved only one treatment group. There are inherent, unknownand unquantifiable biases within each study because of the lack of randomization and precludes confident evidence-based guideline  directives      Patient's potential risk factors for the development of chronic kidney disease include:  hypertension, cardiovascular disease, nicotine dependence, obesity, dyslipidemia,  positive family history of kidney disease, advanced age > 86 years.  We discussed the understood risks of either open or minimally invasive renal surgery including bleeding requiring blood transfusion, infection, adjacent organ injury, urine leak, positive  margins, severe pain and standard operative risks such as myocardial infarction, stroke, venous thromboembolism (VTE), pneumonia, PE and even a 0.2% chance of death.      Thermal ablative therapies, either cryoablation or radiofrequency ablation, are considered less-invasive treatment options, however, may be associated with slightly higher risk of local tumor recurrence and more challenging surgical salvage; additionally, measures of success are less well-defined.    Compared to surgical extirpation, there are inherent, unknownand unquantifiable biases within the evidence for thermal ablation due to the lack of randomization and precludes confident evidence-based guideline directives.    Active surveillance with delayed intervention was also discussed with patient  as an option in patients who want to avoid surgery and are willing to accept an increased risk of tumor progression compared to either radical nephrectomy or partial nephrectomy.  Ideally, active surveillance is best utilized in a small renal mass (<4 cm) in a patient with high operative and anesthetic risks.  -Renal US in 6 months      Chronic kidney disease, stage 4  Following with Nephrology.  Recommendations appreciated        Landis Gandy, DO

## 2022-12-10 ENCOUNTER — Other Ambulatory Visit: Payer: Self-pay

## 2022-12-10 ENCOUNTER — Ambulatory Visit (INDEPENDENT_AMBULATORY_CARE_PROVIDER_SITE_OTHER): Payer: Commercial Managed Care - PPO | Admitting: Physician Assistant

## 2022-12-10 VITALS — BP 157/67 | HR 95 | Ht 62.0 in | Wt 195.0 lb

## 2022-12-10 DIAGNOSIS — N184 Chronic kidney disease, stage 4 (severe): Secondary | ICD-10-CM

## 2022-12-10 DIAGNOSIS — N2889 Other specified disorders of kidney and ureter: Secondary | ICD-10-CM

## 2022-12-10 DIAGNOSIS — N281 Cyst of kidney, acquired: Secondary | ICD-10-CM

## 2022-12-10 NOTE — Progress Notes (Signed)
[facility], [facility]  [address] [address]    Progress Note    Name: DENE DANTUONO MRN:  [id number]   Date: 12/10/2022 DOB:  03-24-1946 (77 y.o.)           Chief Complaint: Follow Up (Left renal cyst and left renal lesion. U/S Kidney 09/30/22. Wants Considering renal bx )    Subjective:   Mrs. Arcidiacono is a pleasant 77 year old female with a history of chronic kidney disease stage 4.  In evaluation of her CKD she recently underwent a renal ultrasound.  This demonstrated a left renal cyst measuring roughly 2 cm as well as a left renal lesion measuring 2.8 cm.  She has a remote smoking history for about 10 years with about 5 cigarettes per day.  She quit about 30 years ago.  She denies occupational chemical exposure history.  She denies a personal or family history of urologic malignancy.  She denies a personal history of malignancy. She denies fevers, chills, nausea, vomiting, hematuria, dysuria, flank pain, incontinence, dribbling, hesitancy, suprapubic pain, headaches, vision changes, shortness of breath, chest pain.     Objective :  BP (!) 157/67 (Site: Right, Patient Position: Sitting, Cuff Size: Adult)   Pulse 95   Ht 1.575 m (5\' 2" )   Wt 88.5 kg (195 lb)   BMI 35.67 kg/m       Gen: NAD, alert  Pulm: unlabored at rest  CV: palpable pulses  Abd: soft, Nt/ND  GU: no suprapubic tenderness, no CVAT    Data reviewed:    Current Outpatient Medications   Medication Sig    albuterol sulfate (PROVENTIL OR VENTOLIN OR PROAIR) 90 mcg/actuation Inhalation oral inhaler Take 1-2 Puffs by inhalation Every 6 hours as needed    allopurinoL (ZYLOPRIM) 100 mg Oral Tablet Take 1 Tablet (100 mg total) by mouth Once a day for 360 days    calcitrioL (ROCALTROL) 0.25 mcg Oral Capsule Take 1 Capsule (0.25 mcg total) by mouth Once a day for 90 days 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    cyanocobalamin (VITAMIN B12) 1,000 mcg/mL Injection Solution INJECT 1 MILLILITER  INTO THE MUSCLE ONCE EVERY MONTH    DULoxetine (CYMBALTA DR) 30 mg Oral Capsule, Delayed Release(E.C.) Take 1 Capsule (30 mg total) by mouth Once a day    ergocalciferol, vitamin D2, (DRISDOL) 1,250 mcg (50,000 unit) Oral Capsule Take 1 Capsule (50,000 Units total) by mouth Every 7 days    furosemide (LASIX) 40 mg Oral Tablet     lamoTRIgine (LAMICTAL) 25 mg Oral Tablet TAKE 1 TABLET BY MOUTH DAILY FOR 2 WEEKS THEN TAKE 2 TABLETS BY MOUTH DAILY    lithium carbonate (ESKALITH) 150 mg Oral Capsule Take 1 Capsule (150 mg total) by mouth Once a day    LORazepam (ATIVAN) 1 mg Oral Tablet Take 1 Tablet (1 mg total) by mouth Twice per day as needed for Anxiety     Assessment/Plan  Problem List Items Addressed This Visit    None    Left renal cyst, 2cm  I had a very lengthy discussion with patient regarding the characteristics, nature and work-up of their renal cyst, as well as the rationale, implications and alternatives of the various management options.  We spent considerable time personally reviewing relevant imaging which are consistent with a incompletely characterized renal cyst  I discussed the nature of incompletely characterized renal cyst warranting further dedicated imaging for further lesion characterization  I discussed the  nature of simple appearing Bosniak I renal cyst which have been reported to affect more than 50% of patients over the age of 46 and warrant no further workup, surveillance or intervention if asymptomatic; however, repeat ultrasonography may be considered at 6 to 12 months in selected patients in order to confirm stability and a correct diagnosis  I discussed the nature of minimally complex Bosniak II renal cyst which pose limited oncologic potential and generally do not warrant further radiographic surveillance; however, repeat ultrasonography may be considered at 6 to 12 months in selected patients in order to confirm stability and a correct diagnosis  I discussed the nature of complex  Bosniak IIF renal cyst which has a reported incidence of malignancy ranging from 5-20% and accordingly warrants further radiographic surveillance  I discussed the nature of complex Bosniak III renal cyst which carries nearly a 50% risk of malignancy and warrants surgical extirpation  I discussed the nature of complex Bosniak IV renal cyst which carries nearly a 90% risk of malignancy and warrants surgical extirpation  Based on patient's clinical and radiographic findings, I would recommend renal ultrasound in 6 months  Generally the indications for surgical intervention for renal cysts include suspected malignancy, pain attributable to cyst expansion, cyst infection and angiotensin-dependent hypertension.  We also discussed the limited role of percutaneous renal fine-needle aspiration (FNA) biopsy; unfortunately, biopsy cannot reliably distinguish a benign renal cyst from the less common cystic renal cell carcinoma.  Preoperative needle biopsy has generally not been recommended for resectable renal lesions, because of concern about seeding of the peritoneum.        Left renal lesion, 2.8cm   I had a very lengthy discussion with patient where I reviewed in detail recent imaging including renal ultrasound. Importantly, the patient's renal cell carcinoma-specific risk factors include: hypertension, obesity, previous nicotine dependence        Patient was educated and counseled on the incidence, nature and risk factors for renal cell carcinoma; notably, I stressed the 80-90% probability that a solid mass of this nature and imaging characteristics represents malignancy. Treatment options were discussed including observation, percutaneous biopsy, radical nephrectomy (open v. laparoscopic), partial nephrectomy (open v. laparoscopic), thermal ablation (open v. laparoscopic v. percutaneous). The risks, advantages, and disadvantages of each approach were discussed in detail.      We briefly discussed the role of percutaneous,  image-guided renal mass biopsy in patients in whom it might impact management, particularly in patients with clinical or radiographic findings suggestive of lymphoma, abscess or metastasis.  We also discussed the limitations of renal mass biopsy including the non-diagnostic yield and hybrid-type tumors which may lend a false sense of reassurance of a perceived indolent tumor such as oncocytoma.  Additionally, renal mass biopsy is not without risks with well-documented risks of retroperitoneal hemorrhage and possible, albeit rare, biopsy tract seeding.     For clinical T1a renal masses (<4.0 cm) in an otherwise health patient without coexistant medical comorbidities or baseline chronic kidney disease, complete surgical excision achieved by either partial nephrectomy or radical nephrectomy when nephron sparing surgery is not technically feasible are considered the standard of care per the 2009 American Urological Association "Guideline for the Management of the Clinical Stage 1 Renal Mass".  Partial nephrectomy, if possible, is recommended when the need exists to preserve renal function, however, can be associated with increased urologic morbidity including higher risk of hemorrhage and urinary fistula.     For clinical T1b+ (>4 cm) renal masses in an otherwise healthy  patient, both radical nephrectomy in the setting of a normal contralateral kidney or partial nephrectomy when the need exists to preserve renal function are considered standards, each given equal weight per the 2009 American Urological Association "Guideline for the Management of the Clinical Stage 1 Renal Mass".  I spent time discussing with patient that the overwhelming majority of studies evaluating partial nephrectomy in higher stage (cT1b+) are observational, retrospective, reported findings on samples of convenience that were not randomized to treatments and involved only one treatment group. There are inherent, unknownand unquantifiable biases  within each study because of the lack of randomization and precludes confident evidence-based guideline directives       Patient's potential risk factors for the development of chronic kidney disease include:  hypertension, cardiovascular disease, nicotine dependence, obesity, dyslipidemia,  positive family history of kidney disease, advanced age > 65 years.  We discussed the understood risks of either open or minimally invasive renal surgery including bleeding requiring blood transfusion, infection, adjacent organ injury, urine leak, positive margins, severe pain and standard operative risks such as myocardial infarction, stroke, venous thromboembolism (VTE), pneumonia, PE and even a 0.2% chance of death.       Thermal ablative therapies, either cryoablation or radiofrequency ablation, are considered less-invasive treatment options, however, may be associated with slightly higher risk of local tumor recurrence and more challenging surgical salvage; additionally, measures of success are less well-defined.    Compared to surgical extirpation, there are inherent, unknownand unquantifiable biases within the evidence for thermal ablation due to the lack of randomization and precludes confident evidence-based guideline directives.     Active surveillance with delayed intervention was also discussed with patient  as an option in patients who want to avoid surgery and are willing to accept an increased risk of tumor progression compared to either radical nephrectomy or partial nephrectomy.  Ideally, active surveillance is best utilized in a small renal mass (<4 cm) in a patient with high operative and anesthetic risks.    Patient request renal mass biopsy which will be scheduled with IR.        Chronic kidney disease, stage 4  Following with Nephrology.  Recommendations appreciated

## 2022-12-13 ENCOUNTER — Other Ambulatory Visit: Payer: Self-pay

## 2022-12-13 ENCOUNTER — Inpatient Hospital Stay
Admission: RE | Admit: 2022-12-13 | Discharge: 2022-12-13 | Disposition: A | Discharge status: Home / Self Care | Payer: Commercial Managed Care - PPO | Source: Ambulatory Visit

## 2022-12-13 ENCOUNTER — Telehealth (INDEPENDENT_AMBULATORY_CARE_PROVIDER_SITE_OTHER): Payer: Self-pay | Admitting: Student in an Organized Health Care Education/Training Program

## 2022-12-13 DIAGNOSIS — N2889 Other specified disorders of kidney and ureter: Secondary | ICD-10-CM

## 2022-12-13 NOTE — Ancillary Notes (Signed)
Dr. Dortha Schwalbe unable to do renal biopsy with only ultrasound imaging of the mass. He requested patient have MRI of kidneys.  Beth at Dr. Fredonia Highland was notified and biopsy was canceled.

## 2022-12-13 NOTE — Telephone Encounter (Signed)
Per Dr Dortha Schwalbe he cannot do a renal biopsy from the findings of the renal US and has recommended a MRI.    Per Dr Fredonia Highland, order MRI without contrast d/t pt elevated creatinine.  Dr Dortha Schwalbe ok with MRI without contrast.

## 2022-12-16 ENCOUNTER — Ambulatory Visit (HOSPITAL_COMMUNITY): Payer: Self-pay

## 2022-12-18 ENCOUNTER — Inpatient Hospital Stay
Admission: RE | Admit: 2022-12-18 | Discharge: 2022-12-18 | Disposition: A | Discharge status: Home / Self Care | Payer: Commercial Managed Care - PPO | Source: Ambulatory Visit | Attending: Student in an Organized Health Care Education/Training Program | Admitting: Student in an Organized Health Care Education/Training Program

## 2022-12-18 ENCOUNTER — Other Ambulatory Visit: Payer: Self-pay

## 2022-12-18 DIAGNOSIS — N2889 Other specified disorders of kidney and ureter: Secondary | ICD-10-CM | POA: Insufficient documentation

## 2022-12-27 ENCOUNTER — Ambulatory Visit (HOSPITAL_COMMUNITY): Payer: Self-pay

## 2023-01-03 ENCOUNTER — Other Ambulatory Visit (INDEPENDENT_AMBULATORY_CARE_PROVIDER_SITE_OTHER): Payer: Self-pay

## 2023-01-10 ENCOUNTER — Encounter (INDEPENDENT_AMBULATORY_CARE_PROVIDER_SITE_OTHER): Payer: Self-pay | Admitting: Nephrology

## 2023-03-17 ENCOUNTER — Other Ambulatory Visit (INDEPENDENT_AMBULATORY_CARE_PROVIDER_SITE_OTHER): Payer: Self-pay

## 2023-03-17 ENCOUNTER — Other Ambulatory Visit: Payer: Commercial Managed Care - PPO

## 2023-03-17 ENCOUNTER — Other Ambulatory Visit: Payer: Self-pay

## 2023-03-17 ENCOUNTER — Ambulatory Visit (INDEPENDENT_AMBULATORY_CARE_PROVIDER_SITE_OTHER): Payer: Commercial Managed Care - PPO

## 2023-03-17 DIAGNOSIS — N184 Chronic kidney disease, stage 4 (severe): Secondary | ICD-10-CM

## 2023-03-17 DIAGNOSIS — E559 Vitamin D deficiency, unspecified: Secondary | ICD-10-CM

## 2023-03-17 LAB — CBC WITH DIFF
BASOPHIL #: 0.1 10*3/uL (ref 0.00–0.10)
BASOPHIL %: 1 % (ref 0–1)
EOSINOPHIL #: 0.6 10*3/uL — ABNORMAL HIGH (ref 0.00–0.50)
EOSINOPHIL %: 6 % (ref 1–7)
HCT: 45.8 % — ABNORMAL HIGH (ref 31.2–41.9)
HGB: 15.4 g/dL — ABNORMAL HIGH (ref 10.9–14.3)
LYMPHOCYTE #: 3.6 10*3/uL — ABNORMAL HIGH (ref 1.00–3.00)
LYMPHOCYTE %: 35 % (ref 16–44)
MCH: 33.6 pg — ABNORMAL HIGH (ref 24.7–32.8)
MCHC: 33.6 g/dL (ref 32.3–35.6)
MCV: 100.1 fL — ABNORMAL HIGH (ref 75.5–95.3)
MONOCYTE #: 0.7 10*3/uL (ref 0.30–1.00)
MONOCYTE %: 7 % (ref 5–13)
MPV: 9.9 fL (ref 7.9–10.8)
NEUTROPHIL #: 5.3 10*3/uL (ref 1.85–7.80)
NEUTROPHIL %: 51 % (ref 43–77)
PLATELETS: 241 10*3/uL (ref 140–440)
RBC: 4.57 10*6/uL (ref 3.63–4.92)
RDW: 13.9 % (ref 12.3–17.7)
WBC: 10.3 10*3/uL (ref 3.8–11.8)

## 2023-03-17 LAB — PROTEIN/CREATININE RATIO, URINE, RANDOM
CREATININE RANDOM URINE: 18 mg/dL — ABNORMAL LOW (ref 30–125)
PROTEIN RANDOM URINE: <4 mg/dL — ABNORMAL LOW (ref 50–80)

## 2023-03-17 LAB — BASIC METABOLIC PANEL
ANION GAP: 9 mmol/L (ref 4–13)
BUN/CREA RATIO: 16 (ref 6–22)
BUN: 30 mg/dL — ABNORMAL HIGH (ref 7–25)
CALCIUM: 10 mg/dL (ref 8.6–10.3)
CHLORIDE: 109 mmol/L — ABNORMAL HIGH (ref 98–107)
CO2 TOTAL: 24 mmol/L (ref 21–31)
CREATININE: 1.91 mg/dL — ABNORMAL HIGH (ref 0.60–1.30)
ESTIMATED GFR: 27 mL/min/{1.73_m2} — ABNORMAL LOW (ref >59)
GLUCOSE: 100 mg/dL (ref 74–109)
OSMOLALITY, CALCULATED: 290 mOsm/kg (ref 270–290)
POTASSIUM: 5.1 mmol/L (ref 3.5–5.1)
SODIUM: 142 mmol/L (ref 136–145)

## 2023-03-17 LAB — VITAMIN D 25 TOTAL: VITAMIN D 25, TOTAL: 72.98 ng/mL (ref 30.00–100.00)

## 2023-03-17 LAB — PARATHYROID HORMONE (PTH): PTH: 49.2 pg/mL (ref 12.0–88.0)

## 2023-03-17 LAB — URIC ACID: URIC ACID: 4.9 mg/dL (ref 2.3–7.6)

## 2023-03-17 LAB — MICROALBUMIN/CREATININE RATIO, URINE, RANDOM
CREATININE RANDOM URINE: 18 mg/dL — ABNORMAL LOW (ref 30–125)
MICROALBUMIN RANDOM URINE: <0.7 mg/dL

## 2023-03-17 LAB — MAGNESIUM: MAGNESIUM: 2.2 mg/dL (ref 1.9–2.7)

## 2023-03-24 ENCOUNTER — Other Ambulatory Visit: Payer: Self-pay

## 2023-03-24 ENCOUNTER — Ambulatory Visit (INDEPENDENT_AMBULATORY_CARE_PROVIDER_SITE_OTHER): Payer: Commercial Managed Care - PPO | Admitting: Nephrology

## 2023-03-24 ENCOUNTER — Encounter (INDEPENDENT_AMBULATORY_CARE_PROVIDER_SITE_OTHER): Payer: Self-pay | Admitting: Nephrology

## 2023-03-24 VITALS — BP 132/70 | HR 98 | Ht 62.0 in | Wt 183.0 lb

## 2023-03-24 DIAGNOSIS — E559 Vitamin D deficiency, unspecified: Secondary | ICD-10-CM

## 2023-03-24 DIAGNOSIS — N184 Chronic kidney disease, stage 4 (severe): Secondary | ICD-10-CM

## 2023-03-24 DIAGNOSIS — T56891A Toxic effect of other metals, accidental (unintentional), initial encounter: Secondary | ICD-10-CM

## 2023-03-24 DIAGNOSIS — N2581 Secondary hyperparathyroidism of renal origin: Secondary | ICD-10-CM

## 2023-03-24 NOTE — Progress Notes (Addendum)
NEPHROLOGY, [facility]  [address] [place] [address]       Name: Paula Cochran MRN:  [id number]   Date of Birth: 1946-04-13 Age: 77 y.o.   Date: 03/24/2023  Time: 15:21       Nephrology Office Note    Reason for visit: Follow Up      History of Present Illness:  Paula Cochran is a 77 y.o. female with past medical history of bipolar disorder was on lithium for many many years she has underlying chronic kidney disease stage 4.    Patient is here for follow-up she denies nausea or vomiting no chest pain shortness of breath her blood pressure under good control she is not diabetic.  Patient denies edema.  Patient is drinking water.      Past Medical History:  Past Medical History:   Diagnosis Date    Arthropathy     Asthma     Cancer (CMS HCC)     squamous cell    Wears glasses          Past Surgical History:  Past Surgical History:   Procedure Laterality Date    HX APPENDECTOMY      HX DILATION AND CURETTAGE      HX HYSTERECTOMY           Allergies:  Allergies   Allergen Reactions    Amoxicillin Anaphylaxis    Adhesive Rash, Itching and Hives/ Urticaria    Nickel Rash and Itching     Medications:  Current Outpatient Medications   Medication Sig    albuterol sulfate (PROVENTIL OR VENTOLIN OR PROAIR) 90 mcg/actuation Inhalation oral inhaler Take 1-2 Puffs by inhalation Every 6 hours as needed    allopurinoL (ZYLOPRIM) 100 mg Oral Tablet Take 1 Tablet (100 mg total) by mouth Once a day for 360 days    Biotin 1 mg Oral Tablet Take by mouth    cyanocobalamin (VITAMIN B12) 1,000 mcg/mL Injection Solution INJECT 1 MILLILITER INTO THE MUSCLE ONCE EVERY MONTH    DULoxetine (CYMBALTA DR) 30 mg Oral Capsule, Delayed Release(E.C.) Take 1 Capsule (30 mg total) by mouth Once a day    ergocalciferol, vitamin D2, (DRISDOL) 1,250 mcg (50,000 unit) Oral Capsule Take 1 Capsule (50,000 Units total) by mouth Every 7 days    furosemide (LASIX) 40 mg Oral Tablet     lamoTRIgine (LAMICTAL) 25 mg Oral Tablet TAKE 1  TABLET BY MOUTH DAILY FOR 2 WEEKS THEN TAKE 2 TABLETS BY MOUTH DAILY    LORazepam (ATIVAN) 1 mg Oral Tablet Take 1 Tablet (1 mg total) by mouth Twice per day as needed for Anxiety     Family History:  Family Medical History:    None         Social History:  Social History     Socioeconomic History    Marital status: Married   Tobacco Use    Smoking status: Every Day     Current packs/day: 0.25     Average packs/day: 0.3 packs/day for 20.0 years (5.0 ttl pk-yrs)     Types: Cigarettes    Smokeless tobacco: Never   Vaping Use    Vaping status: Never Used   Substance and Sexual Activity    Alcohol use: Never    Drug use: Never    Sexual activity: Yes       Review of Systems:        Systematic review of 12 organ systems was negative  except what mentioned in in the HPI.      Physical Exam:  Vitals:    03/24/23 1500   BP: 132/70   Pulse: 98   Weight: 83 kg (182 lb 15.7 oz)   Height: 1.575 m (5\' 2" )   BMI: 33.54      Patient is alert awake and oriented not in acute distress.  Normal mood and affect.  HEENT normocephalic atraumatic.  Eye exam normal inspection.   Mucous membranes moist, no jaundice.  Neck exam no JVD normal inspection.  Cardiovascular system: Regular rate and rhythm no murmurs rubs or gallops. No chest wall tenderness  Lungs: Clear to auscultation bilaterally no wheezing no rhonchi.  Abdomen soft nontender nondistended.  Extremities no clubbing cyanosis or edema.  Neuro exam: EOMI, normal speech      Lab:  Lab Results   Component Value Date    BUN 30 (H) 03/17/2023    BUN 29 (H) 10/03/2022    BUN 24 08/30/2022    CREATININE 1.91 (H) 03/17/2023    CREATININE 1.83 (H) 10/03/2022    CREATININE 1.67 (H) 08/30/2022    BUNCRRATIO 16 03/17/2023    BUNCRRATIO 16 10/03/2022    BUNCRRATIO 14 08/30/2022    GFR 27 (L) 03/17/2023    GFR 28 (L) 10/03/2022    GFR 32 (L) 08/30/2022    SODIUM 142 03/17/2023    SODIUM 141 10/03/2022    SODIUM 142 08/30/2022    POTASSIUM 5.1 03/17/2023    POTASSIUM 4.4 10/03/2022    POTASSIUM  4.9 08/30/2022    CHLORIDE 109 (H) 03/17/2023    CHLORIDE 110 (H) 10/03/2022    CHLORIDE 112 (H) 08/30/2022    CO2 24 03/17/2023    CO2 25 10/03/2022    CO2 25 08/30/2022    ANIONGAP 9 03/17/2023    ANIONGAP 6 10/03/2022    ANIONGAP 5 08/30/2022    CALCIUM 10.0 03/17/2023    CALCIUM 9.6 10/03/2022    CALCIUM 9.5 08/30/2022    PHOSPHORUS 3.6 (L) 10/03/2022    ALBUMIN 4.0 10/03/2022    ALBUMIN 4.2 08/30/2022    ALBUMIN 4.0 11/10/2021    HGB 15.4 (H) 03/17/2023    HGB 14.7 (H) 10/03/2022    HGB 15.5 (H) 08/30/2022    HCT 45.8 (H) 03/17/2023    HCT 43.6 (H) 10/03/2022    HCT 47.3 (H) 08/30/2022    INTACTPTH 49.2 03/17/2023    INTACTPTH 127.1 (H) 10/03/2022    IRON 169 10/03/2022    IRONBINDCAP 270 10/03/2022    IRONSAT 63 (H) 10/03/2022    FERRITIN 68 10/03/2022       Lab Results   Component Value Date    URICACID 4.9 03/17/2023    URICACID 8.2 (H) 10/03/2022        Assessment and Plan:  ENCOUNTER DIAGNOSES     ICD-10-CM   1. CKD (chronic kidney disease) stage 4, GFR 15-29 ml/min (CMS HCC)  N18.4   2. Vitamin D deficiency  E55.9              Chronic kidney disease  -Stage 4  -Due to chronic lithium toxicity.  Patient has stopped her leaning completely she is on Abilify and Lamictal.  -Creatinine is stable   1.9   -Baseline creatinine 1.6-1.8  -Total protein to creatinine ratio, not significant  -UACR, not significant  -CBC and a basic metabolic panel  -Return to clinic in 4 months  -Continue low-sodium diet  -balanced Fluid intake 40-50  oz a day.    -Avoid NSAIDs.  Tylenol only for pain  -Renal imaging MRI indicating no masses, follow up with Urology outpatient.  Renal ultrasound indicating medical renal disease.  -ACEI or ARBS:  no  -Sodium Glucose Cotransporter-2 (SGLT2) Inhibitors:  No    CKD bone mineral disease/secondary hyperparathyroidism   -PTH better  PTH   Date Value Ref Range Status   03/17/2023 49.2 12.0 - 88.0 pg/mL Final      -Vitamin D level at goal      Orders Placed This Encounter    BASIC METABOLIC  PANEL    CBC/DIFF    MAGNESIUM    MICROALBUMIN/CREATININE RATIO, URINE, RANDOM    PARATHYROID HORMONE (PTH)    PROTEIN/CREATININE RATIO, URINE, RANDOM    URIC ACID    VITAMIN D 25 TOTAL

## 2023-04-11 ENCOUNTER — Encounter (INDEPENDENT_AMBULATORY_CARE_PROVIDER_SITE_OTHER): Payer: Self-pay | Admitting: Student in an Organized Health Care Education/Training Program

## 2023-04-24 ENCOUNTER — Emergency Department (HOSPITAL_COMMUNITY): Payer: Commercial Managed Care - PPO

## 2023-04-24 ENCOUNTER — Observation Stay
Admission: EM | Admit: 2023-04-24 | Discharge: 2023-04-30 | Disposition: A | Discharge status: Skilled Nursing Facility | Payer: Commercial Managed Care - PPO

## 2023-04-24 ENCOUNTER — Inpatient Hospital Stay (HOSPITAL_COMMUNITY): Payer: Commercial Managed Care - PPO | Admitting: Internal Medicine

## 2023-04-24 ENCOUNTER — Other Ambulatory Visit: Payer: Self-pay

## 2023-04-24 ENCOUNTER — Encounter (HOSPITAL_COMMUNITY): Payer: Self-pay

## 2023-04-24 DIAGNOSIS — N281 Cyst of kidney, acquired: Secondary | ICD-10-CM | POA: Insufficient documentation

## 2023-04-24 DIAGNOSIS — R531 Weakness: Principal | ICD-10-CM | POA: Insufficient documentation

## 2023-04-24 DIAGNOSIS — F17210 Nicotine dependence, cigarettes, uncomplicated: Secondary | ICD-10-CM | POA: Diagnosis present

## 2023-04-24 DIAGNOSIS — K2289 Other specified disease of esophagus: Secondary | ICD-10-CM

## 2023-04-24 DIAGNOSIS — K828 Other specified diseases of gallbladder: Secondary | ICD-10-CM | POA: Diagnosis present

## 2023-04-24 DIAGNOSIS — K7460 Unspecified cirrhosis of liver: Secondary | ICD-10-CM | POA: Insufficient documentation

## 2023-04-24 DIAGNOSIS — N184 Chronic kidney disease, stage 4 (severe): Secondary | ICD-10-CM | POA: Insufficient documentation

## 2023-04-24 DIAGNOSIS — K269 Duodenal ulcer, unspecified as acute or chronic, without hemorrhage or perforation: Secondary | ICD-10-CM | POA: Insufficient documentation

## 2023-04-24 DIAGNOSIS — R110 Nausea: Secondary | ICD-10-CM | POA: Insufficient documentation

## 2023-04-24 DIAGNOSIS — E790 Hyperuricemia without signs of inflammatory arthritis and tophaceous disease: Secondary | ICD-10-CM

## 2023-04-24 DIAGNOSIS — E538 Deficiency of other specified B group vitamins: Secondary | ICD-10-CM

## 2023-04-24 DIAGNOSIS — W19XXXA Unspecified fall, initial encounter: Secondary | ICD-10-CM | POA: Diagnosis present

## 2023-04-24 DIAGNOSIS — R0602 Shortness of breath: Secondary | ICD-10-CM

## 2023-04-24 DIAGNOSIS — Z87898 Personal history of other specified conditions: Secondary | ICD-10-CM

## 2023-04-24 DIAGNOSIS — K219 Gastro-esophageal reflux disease without esophagitis: Secondary | ICD-10-CM | POA: Insufficient documentation

## 2023-04-24 DIAGNOSIS — K2970 Gastritis, unspecified, without bleeding: Secondary | ICD-10-CM | POA: Insufficient documentation

## 2023-04-24 DIAGNOSIS — E559 Vitamin D deficiency, unspecified: Secondary | ICD-10-CM

## 2023-04-24 DIAGNOSIS — K449 Diaphragmatic hernia without obstruction or gangrene: Secondary | ICD-10-CM | POA: Insufficient documentation

## 2023-04-24 DIAGNOSIS — E860 Dehydration: Secondary | ICD-10-CM | POA: Diagnosis present

## 2023-04-24 DIAGNOSIS — R638 Other symptoms and signs concerning food and fluid intake: Secondary | ICD-10-CM

## 2023-04-24 DIAGNOSIS — J45909 Unspecified asthma, uncomplicated: Secondary | ICD-10-CM | POA: Diagnosis present

## 2023-04-24 DIAGNOSIS — F3132 Bipolar disorder, current episode depressed, moderate: Secondary | ICD-10-CM | POA: Diagnosis present

## 2023-04-24 DIAGNOSIS — R935 Abnormal findings on diagnostic imaging of other abdominal regions, including retroperitoneum: Secondary | ICD-10-CM | POA: Insufficient documentation

## 2023-04-24 DIAGNOSIS — F319 Bipolar disorder, unspecified: Secondary | ICD-10-CM | POA: Insufficient documentation

## 2023-04-24 DIAGNOSIS — K5900 Constipation, unspecified: Secondary | ICD-10-CM | POA: Insufficient documentation

## 2023-04-24 DIAGNOSIS — N2889 Other specified disorders of kidney and ureter: Secondary | ICD-10-CM

## 2023-04-24 DIAGNOSIS — F17200 Nicotine dependence, unspecified, uncomplicated: Secondary | ICD-10-CM | POA: Insufficient documentation

## 2023-04-24 DIAGNOSIS — K259 Gastric ulcer, unspecified as acute or chronic, without hemorrhage or perforation: Secondary | ICD-10-CM | POA: Diagnosis present

## 2023-04-24 DIAGNOSIS — B372 Candidiasis of skin and nail: Secondary | ICD-10-CM | POA: Insufficient documentation

## 2023-04-24 DIAGNOSIS — N179 Acute kidney failure, unspecified: Secondary | ICD-10-CM | POA: Insufficient documentation

## 2023-04-24 DIAGNOSIS — N2581 Secondary hyperparathyroidism of renal origin: Secondary | ICD-10-CM

## 2023-04-24 DIAGNOSIS — N183 Chronic kidney disease, stage 3 unspecified (CMS HCC): Secondary | ICD-10-CM | POA: Diagnosis present

## 2023-04-24 DIAGNOSIS — D631 Anemia in chronic kidney disease: Secondary | ICD-10-CM

## 2023-04-24 DIAGNOSIS — R296 Repeated falls: Secondary | ICD-10-CM | POA: Insufficient documentation

## 2023-04-24 DIAGNOSIS — R948 Abnormal results of function studies of other organs and systems: Secondary | ICD-10-CM

## 2023-04-24 DIAGNOSIS — Z8659 Personal history of other mental and behavioral disorders: Secondary | ICD-10-CM

## 2023-04-24 DIAGNOSIS — Z79899 Other long term (current) drug therapy: Secondary | ICD-10-CM

## 2023-04-24 DIAGNOSIS — K8020 Calculus of gallbladder without cholecystitis without obstruction: Principal | ICD-10-CM | POA: Insufficient documentation

## 2023-04-24 DIAGNOSIS — K2100 Gastro-esophageal reflux disease with esophagitis, without bleeding: Secondary | ICD-10-CM | POA: Diagnosis present

## 2023-04-24 DIAGNOSIS — Z751 Person awaiting admission to adequate facility elsewhere: Secondary | ICD-10-CM

## 2023-04-24 DIAGNOSIS — I872 Venous insufficiency (chronic) (peripheral): Secondary | ICD-10-CM

## 2023-04-24 HISTORY — DX: Bipolar disorder, unspecified: F31.9

## 2023-04-24 HISTORY — DX: Chronic kidney disease, unspecified: N18.9

## 2023-04-24 LAB — COMPREHENSIVE METABOLIC PANEL, NON-FASTING
ALBUMIN/GLOBULIN RATIO: 1.6 — ABNORMAL HIGH (ref 0.8–1.4)
ALBUMIN: 3.6 g/dL (ref 3.5–5.7)
ALKALINE PHOSPHATASE: 214 U/L — ABNORMAL HIGH (ref 34–104)
ALT (SGPT): 17 U/L (ref 7–52)
ANION GAP: 13 mmol/L (ref 4–13)
AST (SGOT): 27 U/L (ref 13–39)
BILIRUBIN TOTAL: 0.9 mg/dL (ref 0.3–1.0)
BUN/CREA RATIO: 14 (ref 6–22)
BUN: 25 mg/dL (ref 7–25)
CALCIUM, CORRECTED: 9.9 mg/dL (ref 8.9–10.8)
CALCIUM: 9.6 mg/dL (ref 8.6–10.3)
CHLORIDE: 105 mmol/L (ref 98–107)
CO2 TOTAL: 19 mmol/L — ABNORMAL LOW (ref 21–31)
CREATININE: 1.79 mg/dL — ABNORMAL HIGH (ref 0.60–1.30)
ESTIMATED GFR: 29 mL/min/{1.73_m2} — ABNORMAL LOW (ref >59)
GLOBULIN: 2.3 — ABNORMAL LOW (ref 2.9–5.4)
GLUCOSE: 118 mg/dL — ABNORMAL HIGH (ref 74–109)
OSMOLALITY, CALCULATED: 279 mOsm/kg (ref 270–290)
POTASSIUM: 4.7 mmol/L (ref 3.5–5.1)
PROTEIN TOTAL: 5.9 g/dL — ABNORMAL LOW (ref 6.4–8.9)
SODIUM: 137 mmol/L (ref 136–145)

## 2023-04-24 LAB — URINALYSIS, MACROSCOPIC
BILIRUBIN: NEGATIVE mg/dL
BLOOD: NEGATIVE mg/dL
GLUCOSE: NEGATIVE mg/dL
KETONES: NEGATIVE mg/dL
LEUKOCYTES: NEGATIVE WBCs/uL
NITRITE: NEGATIVE
PH: 5.5 (ref 5.0–9.0)
PROTEIN: NEGATIVE mg/dL
SPECIFIC GRAVITY: 1.005 (ref 1.002–1.030)
UROBILINOGEN: NORMAL mg/dL

## 2023-04-24 LAB — ARTERIAL BLOOD GAS/LACTATE
%FIO2 (ARTERIAL): 21 %
BASE DEFICIT: 3.8 mmol/L — ABNORMAL HIGH (ref 0.0–2.0)
BICARBONATE (ARTERIAL): 22 mmol/L (ref 20.0–26.0)
CARBOXYHEMOGLOBIN: 1.3 % (ref <=1.5)
LACTATE: 0.8 mmol/L (ref <=2.0)
MET-HEMOGLOBIN: 0.1 % (ref <=2.0)
O2CT: 20.4 %
OXYHEMOGLOBIN: 94.3 % (ref 88.0–100.0)
PCO2 (ARTERIAL): 34 mm/Hg — ABNORMAL LOW (ref 35–45)
PH (ARTERIAL): 7.41 (ref 7.35–7.45)
PO2 (ARTERIAL): 73 mm/Hg — ABNORMAL LOW (ref 80–100)

## 2023-04-24 LAB — LIPASE: LIPASE: 8 U/L — ABNORMAL LOW (ref 11–82)

## 2023-04-24 LAB — AMMONIA: AMMONIA: 20 umol/L (ref 16–53)

## 2023-04-24 LAB — URINALYSIS, MICROSCOPIC
BACTERIA: NEGATIVE /hpf
RBCS: <1 /hpf (ref <4)
SQUAMOUS EPITHELIAL: <1 /hpf (ref <28)
WBCS: <1 /hpf (ref <6)

## 2023-04-24 LAB — ETHANOL, SERUM/PLASMA: ETHANOL: <10 mg/dL

## 2023-04-24 LAB — LACTIC ACID LEVEL W/ REFLEX FOR LEVEL >2.0: LACTIC ACID: 1 mmol/L (ref 0.5–2.2)

## 2023-04-24 MED ORDER — SODIUM CHLORIDE 0.9 % (FLUSH) INJECTION SYRINGE
3.0000 mL | INJECTION | INTRAMUSCULAR | Status: AC | PRN
Start: 2023-04-24 — End: ?

## 2023-04-24 MED ORDER — METRONIDAZOLE 500 MG/100 ML IN SODIUM CHLOR(ISO) INTRAVENOUS PIGGYBACK
500.0000 mg | INJECTION | INTRAVENOUS | Status: AC
Start: 2023-04-24 — End: 2023-04-24
  Administered 2023-04-24: 500 mg via INTRAVENOUS
  Administered 2023-04-24: 0 mg via INTRAVENOUS

## 2023-04-24 MED ORDER — ELECTROLYTE-R BOLUS
30.0000 mL/kg | INJECTION | INTRAVENOUS | Status: DC
Start: 2023-04-24 — End: 2023-04-24

## 2023-04-24 MED ORDER — MAGNESIUM CITRATE ORAL SOLUTION
296.0000 mL | ORAL | Status: AC
Start: 2023-04-24 — End: 2023-04-24
  Administered 2023-04-24: 296 mL via ORAL
  Filled 2023-04-24: qty 296

## 2023-04-24 MED ORDER — ONDANSETRON HCL (PF) 4 MG/2 ML INJECTION SOLUTION
4.0000 mg | Freq: Four times a day (QID) | INTRAMUSCULAR | Status: AC | PRN
Start: 2023-04-24 — End: ?
  Administered 2023-04-25 (×2): 4 mg via INTRAVENOUS
  Filled 2023-04-24 (×2): qty 2

## 2023-04-24 MED ORDER — ARIPIPRAZOLE 10 MG TABLET
10.0000 mg | ORAL_TABLET | Freq: Every day | ORAL | Status: DC
Start: 2023-04-24 — End: 2023-04-25
  Administered 2023-04-24: 10 mg via ORAL
  Administered 2023-04-25: 0 mg via ORAL
  Filled 2023-04-24 (×3): qty 1

## 2023-04-24 MED ORDER — ALLOPURINOL 100 MG TABLET
100.0000 mg | ORAL_TABLET | Freq: Every day | ORAL | Status: DC
Start: 2023-04-24 — End: 2023-05-01
  Administered 2023-04-24: 0 mg via ORAL
  Administered 2023-04-25 – 2023-04-30 (×6): 100 mg via ORAL
  Filled 2023-04-24 (×9): qty 1

## 2023-04-24 MED ORDER — ALBUTEROL SULFATE HFA 90 MCG/ACTUATION AEROSOL INHALER
1.0000 | INHALATION_SPRAY | Freq: Four times a day (QID) | RESPIRATORY_TRACT | Status: AC | PRN
Start: 2023-04-24 — End: ?

## 2023-04-24 MED ORDER — KETOROLAC 60 MG/2 ML INTRAMUSCULAR SOLUTION
INTRAMUSCULAR | Status: AC
Start: 2023-04-24 — End: 2023-04-24
  Filled 2023-04-24: qty 2

## 2023-04-24 MED ORDER — HEPARIN (PORCINE) 5,000 UNIT/ML INJECTION SOLUTION
5000.0000 [IU] | Freq: Three times a day (TID) | INTRAMUSCULAR | Status: AC
Start: 2023-04-24 — End: ?
  Administered 2023-04-24 – 2023-04-30 (×18): 5000 [IU] via SUBCUTANEOUS
  Administered 2023-04-30: 0 [IU] via SUBCUTANEOUS
  Filled 2023-04-24 (×19): qty 1

## 2023-04-24 MED ORDER — LEVOFLOXACIN 500 MG/100 ML IN 5 % DEXTROSE INTRAVENOUS PIGGYBACK
500.0000 mg | INJECTION | INTRAVENOUS | Status: DC
Start: 2023-04-25 — End: 2023-04-25

## 2023-04-24 MED ORDER — METRONIDAZOLE 500 MG/100 ML IN SODIUM CHLOR(ISO) INTRAVENOUS PIGGYBACK
500.0000 mg | INJECTION | Freq: Two times a day (BID) | INTRAVENOUS | Status: DC
Start: 2023-04-25 — End: 2023-04-25
  Administered 2023-04-25: 500 mg via INTRAVENOUS
  Administered 2023-04-25: 0 mg via INTRAVENOUS
  Filled 2023-04-24: qty 100

## 2023-04-24 MED ORDER — ELECTROLYTE-R BOLUS
30.0000 mL/kg | INJECTION | Freq: Once | INTRAVENOUS | Status: AC
Start: 2023-04-24 — End: 2023-04-24
  Administered 2023-04-24: 2994 mL via INTRAVENOUS

## 2023-04-24 MED ORDER — DOCUSATE SODIUM 100 MG CAPSULE
200.0000 mg | ORAL_CAPSULE | Freq: Every day | ORAL | Status: DC
Start: 2023-04-24 — End: 2023-05-01
  Administered 2023-04-24 – 2023-04-30 (×7): 200 mg via ORAL
  Filled 2023-04-24 (×6): qty 2

## 2023-04-24 MED ORDER — LEVOFLOXACIN 750 MG/150 ML IN 5 % DEXTROSE INTRAVENOUS PIGGYBACK
INJECTION | INTRAVENOUS | Status: AC
Start: 2023-04-24 — End: 2023-04-24
  Filled 2023-04-24: qty 150

## 2023-04-24 MED ORDER — DOCUSATE SODIUM 100 MG CAPSULE
ORAL_CAPSULE | ORAL | Status: AC
Start: 2023-04-24 — End: 2023-04-24
  Filled 2023-04-24: qty 2

## 2023-04-24 MED ORDER — SODIUM CHLORIDE 0.9 % INTRAVENOUS SOLUTION
INTRAVENOUS | Status: DC
Start: 2023-04-24 — End: 2023-04-28
  Administered 2023-04-28: 0 mL via INTRAVENOUS

## 2023-04-24 MED ORDER — NYSTATIN 100,000 UNIT/GRAM TOPICAL POWDER
Freq: Two times a day (BID) | CUTANEOUS | Status: AC
Start: 2023-04-24 — End: ?
  Administered 2023-04-28: 1 [IU] via TOPICAL
  Administered 2023-04-30: 0 g via TOPICAL
  Filled 2023-04-24 (×9): qty 15

## 2023-04-24 MED ORDER — METRONIDAZOLE 500 MG/100 ML IN SODIUM CHLOR(ISO) INTRAVENOUS PIGGYBACK
INJECTION | INTRAVENOUS | Status: AC
Start: 2023-04-24 — End: 2023-04-24
  Filled 2023-04-24: qty 100

## 2023-04-24 MED ORDER — METHOCARBAMOL 100 MG/ML INJECTION SOLUTION
INTRAMUSCULAR | Status: AC
Start: 2023-04-24 — End: 2023-04-24
  Filled 2023-04-24: qty 10

## 2023-04-24 MED ORDER — NYSTATIN 100,000 UNIT/GRAM TOPICAL POWDER
Freq: Two times a day (BID) | CUTANEOUS | Status: DC
Start: 2023-04-24 — End: 2023-04-24

## 2023-04-24 MED ORDER — SODIUM CHLORIDE 0.9 % (FLUSH) INJECTION SYRINGE
3.0000 mL | INJECTION | Freq: Three times a day (TID) | INTRAMUSCULAR | Status: AC
Start: 2023-04-24 — End: ?
  Administered 2023-04-24: 0 mL
  Administered 2023-04-24 – 2023-04-25 (×2): 3 mL
  Administered 2023-04-25 (×2): 0 mL
  Administered 2023-04-26: 3 mL
  Administered 2023-04-26: 0 mL
  Administered 2023-04-26 – 2023-04-27 (×2): 3 mL
  Administered 2023-04-27: 0 mL
  Administered 2023-04-27 – 2023-04-28 (×3): 3 mL
  Administered 2023-04-28: 0 mL
  Administered 2023-04-29: 3 mL
  Administered 2023-04-29: 0 mL
  Administered 2023-04-29: 3 mL
  Administered 2023-04-30: 0 mL
  Administered 2023-04-30: 3 mL
  Administered 2023-04-30: 0 mL

## 2023-04-24 MED ORDER — LORAZEPAM 1 MG TABLET
1.5000 mg | ORAL_TABLET | Freq: Every evening | ORAL | Status: AC
Start: 2023-04-24 — End: ?
  Administered 2023-04-24 – 2023-04-30 (×7): 1.5 mg via ORAL
  Filled 2023-04-24 (×7): qty 1

## 2023-04-24 MED ORDER — SODIUM CHLORIDE 0.9 % (FLUSH) INJECTION SYRINGE
3.0000 mL | INJECTION | Freq: Three times a day (TID) | INTRAMUSCULAR | Status: AC
Start: 2023-04-24 — End: ?
  Administered 2023-04-24: 0 mL
  Administered 2023-04-24 – 2023-04-25 (×2): 3 mL
  Administered 2023-04-25: 0 mL
  Administered 2023-04-25: 3 mL
  Administered 2023-04-26: 0 mL
  Administered 2023-04-26 – 2023-04-27 (×3): 3 mL
  Administered 2023-04-27 (×2): 0 mL
  Administered 2023-04-28: 3 mL
  Administered 2023-04-28 – 2023-04-29 (×4): 0 mL
  Administered 2023-04-29: 3 mL
  Administered 2023-04-30 (×3): 0 mL

## 2023-04-24 MED ORDER — LEVOFLOXACIN 750 MG/150 ML IN 5 % DEXTROSE INTRAVENOUS PIGGYBACK
750.0000 mg | INJECTION | INTRAVENOUS | Status: AC
Start: 2023-04-24 — End: 2023-04-24
  Administered 2023-04-24: 0 mg via INTRAVENOUS
  Administered 2023-04-24: 750 mg via INTRAVENOUS

## 2023-04-24 MED ORDER — DEXAMETHASONE SODIUM PHOSPHATE (PF) 10 MG/ML INJECTION SOLUTION
INTRAMUSCULAR | Status: AC
Start: 2023-04-24 — End: 2023-04-24
  Filled 2023-04-24: qty 1

## 2023-04-24 MED ORDER — PANTOPRAZOLE DR 40 MG GRANULES DELAYED-RELEASE FOR SUSP IN PACKET
40.0000 mg | GRANULES | Freq: Every morning | ORAL | Status: DC
Start: 2023-04-25 — End: 2023-04-25
  Filled 2023-04-24: qty 1

## 2023-04-24 MED ORDER — ACETAMINOPHEN 325 MG TABLET
650.0000 mg | ORAL_TABLET | ORAL | Status: AC | PRN
Start: 2023-04-24 — End: ?
  Administered 2023-04-29: 650 mg via ORAL
  Filled 2023-04-24 (×2): qty 2

## 2023-04-24 MED ORDER — LAMOTRIGINE 100 MG TABLET
200.0000 mg | ORAL_TABLET | Freq: Every day | ORAL | Status: DC
Start: 2023-04-24 — End: 2023-05-01
  Administered 2023-04-24 – 2023-04-30 (×7): 200 mg via ORAL
  Filled 2023-04-24 (×9): qty 2

## 2023-04-24 NOTE — ED Provider Notes (Signed)
[facility]  ED Primary Provider Note  Patient Name: Paula Cochran  Patient Age: 77 y.o.  Date of Birth: 1946-01-05    Chief Complaint: Weakness        History of Present Illness       Paula Cochran is a 77 y.o. female who had concerns including Weakness.  This patient is a 77 year old female who presents with weakness.  She has history of bipolar, in addition to frequent falls.  The patient has been treated for her bipolar disorder through telephone visits, has not seen a provider in quite some time.  Another element of presentation is the lack of desire to eat.        Review of Systems     No other overt Review of Systems are noted to be positive except noted in the HPI.      Historical Data   History Reviewed This Encounter:        Physical Exam   ED Triage Vitals [04/24/23 1247]   BP (Non-Invasive) 126/69   Heart Rate (!) 111   Respiratory Rate 18   Temperature 37.2 C (98.9 F)   SpO2 96 %   Weight 99.8 kg (220 lb)   Height 1.575 m (5\' 2" )         Nursing notes reviewed for what could be assessed. Past Medical, Surgical, and Social history reviewed for what has been completed.     Constitutional: NAD. Well-Developed. Well Nourished.  Head: Normocephalic, atraumatic.  Mouth/Throat:  Symmetric facial movement.  Eyes: EOM grossly intact, conjunctiva normal.  Neck: Supple  Cardiovascular: Regular Rate and Rhythm, extremities well perfused.  Pulmonary/Chest: No respiratory distress. Lungs are symmetric to auscultation bilaterally.  Abdominal: Soft, some right upper quadrant tenderness, non-distended. Non peritoneal, no rebound, no guarding.  MSK: No Lower Extremity Edema.  Skin: Warm, dry with nurse Lannette Donath as a chaperone, the patient has erythematous excoriations in the groin.  Neuro: Appropriate, CN II-XII grossly intact.  Psych:  Flat affect.  Pleasant.            Procedures      Patient Data     Labs Ordered/Reviewed   COMPREHENSIVE METABOLIC PANEL, NON-FASTING - Abnormal;  Notable for the following components:       Result Value    CO2 TOTAL 19 (*)     CREATININE 1.79 (*)     ESTIMATED GFR 29 (*)     GLUCOSE 118 (*)     ALKALINE PHOSPHATASE 214 (*)     PROTEIN TOTAL 5.9 (*)     ALBUMIN/GLOBULIN RATIO 1.6 (*)     GLOBULIN 2.3 (*)     All other components within normal limits    Narrative:     Estimated Glomerular Filtration Rate (eGFR) is calculated using the CKD-EPI (2021) equation, intended for patients 43 years of age and older. If gender is not documented or "unknown", there will be no eGFR calculation.     CBC WITH DIFF - Abnormal; Notable for the following components:    WBC 13.5 (*)     HGB 15.4 (*)     HCT 45.3 (*)     MCV 97.2 (*)     MCH 33.0 (*)     All other components within normal limits   URINALYSIS, MICROSCOPIC - Abnormal; Notable for the following components:    BUDDING YEAST Rare (*)     All other components within normal limits   LIPASE - Abnormal; Notable  for the following components:    LIPASE 8 (*)     All other components within normal limits   ARTERIAL BLOOD GAS/LACTATE - Abnormal; Notable for the following components:    PCO2 (ARTERIAL) 34 (*)     PO2 (ARTERIAL) 73 (*)     BASE DEFICIT 3.8 (*)     All other components within normal limits    Narrative:     .  .  .  .  .  Marland Kitchen   ETHANOL, SERUM/PLASMA - Normal   URINALYSIS, MACROSCOPIC - Normal   LACTIC ACID LEVEL W/ REFLEX FOR LEVEL >2.0 - Normal   AMMONIA - Normal   ADULT ROUTINE BLOOD CULTURE, SET OF 2 BOTTLES (BACTERIA AND YEAST)   ADULT ROUTINE BLOOD CULTURE, SET OF 2 BOTTLES (BACTERIA AND YEAST)   CBC/DIFF    Narrative:     The following orders were created for panel order CBC/DIFF.  Procedure                               Abnormality         Status                     ---------                               -----------         ------                     CBC WITH IONG[[id number]]                Abnormal            Final result                 Please view results for these tests on the individual orders.    URINALYSIS, MACROSCOPIC AND MICROSCOPIC W/CULTURE REFLEX    Narrative:     The following orders were created for panel order URINALYSIS, MACROSCOPIC AND MICROSCOPIC W/CULTURE REFLEX.  Procedure                               Abnormality         Status                     ---------                               -----------         ------                     URINALYSIS, MACROSCOPIC[[id number]]      Normal              Final result               URINALYSIS, MICROSCOPIC[[id number]]      Abnormal            Final result                 Please view results for these tests on the individual orders.   SCAN DIFFERENTIAL   PERFORM POC WHOLE BLOOD GLUCOSE       XR AP MOBILE CHEST  Final Result by Edi, Radresults In (09/19 1549)   NO ACUTE FINDINGS ARE IDENTIFIED WHEN COMPARED TO 08/30/2022.            Radiologist location ID: [id number]         CT ABDOMEN PELVIS WO IV CONTRAST   Final Result by Edi, Radresults In (09/19 1455)   1. CHOLELITHIASIS WITH GALLBLADDER DISTENTION. NO SURROUNDING INFLAMMATORY CHANGES   2. WALL THICKENING IN THE DISTAL ESOPHAGUS COULD BE DIRECTLY VISUALIZED   3. CIRRHOTIC MORPHOLOGY OF THE LIVER          One or more dose reduction techniques were used (e.g., Automated exposure control, adjustment of the mA and/or kV according to patient size, use of iterative reconstruction technique).         Radiologist location ID: [id number]         CT BRAIN WO IV CONTRAST   Final Result by Edi, Radresults In (09/19 1451)   NO ACUTE FINDINGS         One or more dose reduction techniques were used (e.g., Automated exposure control, adjustment of the mA and/or kV according to patient size, use of iterative reconstruction technique).         Radiologist location ID: [id number]             Medical Decision Making          Medical Decision Making          Studies Assessed:  Lab, radiology        MDM Narrative:  This patient is a 77 year old female who presents with weakness, in the setting of bipolar disorder.   Patient was evaluated via telephone by her psychiatry team, without a face-to-face evaluation for reported the family.  They are concerned for her well-being.  Patient did have findings consistent with candidiasis in her groin.  Nystatin was ordered.  CT scan did have findings concerning for gallbladder pathology.  The case was discussed with Dr. Donnal Debar.  She was nonsurgical at this point in time however still recommended to be admitted for further evaluation the hospitalist team was contacted, ultimately admitted the patient.        Differential includes but not limited to:  Medication side-effect: The patient has not had a face-to-face evaluation with a mental health provider in quite some time  Cholecystitis/cholelithiasis.  Patient was evaluated by surgery not felt to have a surgical abdomen at this point in time  Urinary tract infection: The patient was not have an obvious urinary tract infection  Pancreatitis: The patient is not have serologic, nor radiographic pancreatitis      Follow-Up Discussion:  Family was updated    Please see documentation above for specific labs and radiology.    Decision for High Risk/High Medical Decision Making and Treatment in the ED:  Decision of hospitalization or escalation of hospital-level care.                              Medications Administered in the ED   NS flush syringe (3 mL Intracatheter Given 04/24/23 1430)   NS flush syringe (has no administration in time range)   NS flush syringe (3 mL Intracatheter Given 04/24/23 1430)   NS flush syringe (has no administration in time range)   nystatin (NYSTOP) 100,000 units/g topical powder (has no administration in time range)   electrolyte-R (NORMOSOL-R) bolus infusion 30 mL/kg = 2,994 mL (2,994 mL Intravenous New Bag/New Syringe  04/24/23 1430)   levoFLOXacin (LEVAQUIN) 750 mg in D5W 150 mL premix IVPB (has no administration in time range)       Patient will be admitted to the  service for further workup and  management.    Disposition: Admitted             Clinical Impression   Weakness (Primary)   Abnormal CT of the abdomen   History of bipolar disorder         Current Discharge Medication List            /R. Tobey Bride, MD, Lacie Scotts  Department of Emergency Medicine  [facility]

## 2023-04-24 NOTE — ED Nurses Note (Signed)
Report called to 3W, spoke with Janan Halter, RN.

## 2023-04-24 NOTE — Consults (Signed)
[facility]  Gastroenterology/ Hepatology Consult Note    Paula Cochran, Paula Cochran, 77 y.o. female  Encounter Start Date:  04/24/2023  Inpatient Admission Date: 04/24/2023   Date of Birth:  05-22-1946    Date of Service: 04/24/23     Referring physician:  No ref. provider found    Chief Complaint: frequent falls    Paula Cochran is a 77 y.o., White female who presents with frequent falls. On evaluation, she reports poor appetite and approximately 10 lb weight loss. No dysphagia, no n/v, no noted bleeding. 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 Gallstones were noted as well.       REVIEW OF SYSTEMS:    Review of Systems was completed with pertinent ROS as addressed in HPI.        Medications Prior to Admission       Prescriptions    albuterol sulfate (PROVENTIL OR VENTOLIN OR PROAIR) 90 mcg/actuation Inhalation oral inhaler    Take 1-2 Puffs by inhalation Every 6 hours as needed    allopurinoL (ZYLOPRIM) 100 mg Oral Tablet    Take 1 Tablet (100 mg total) by mouth Once a day for 360 days    Patient not taking:  Reported on 04/24/2023    ARIPiprazole (ABILIFY) 10 mg Oral Tablet    1 Tablet (10 mg total) Once a day    Biotin 1 mg Oral Tablet    Take by mouth    cyanocobalamin (VITAMIN B12) 1,000 mcg/mL Injection Solution    INJECT 1 MILLILITER INTO THE MUSCLE ONCE EVERY MONTH    ergocalciferol, vitamin D2, (DRISDOL) 1,250 mcg (50,000 unit) Oral Capsule    Take 1 Capsule (50,000 Units total) by mouth Every 7 days    furosemide (LASIX) 40 mg Oral Tablet    Take 0.5 Tablets (20 mg total) by mouth Once a day    lamoTRIgine (LAMICTAL) 25 mg Oral Tablet    8 Tablets (200 mg total) Once a day    LORazepam (ATIVAN) 1 mg Oral Tablet    Take 1.5 Tablets (1.5 mg total) by mouth Every night    LORazepam (ATIVAN) 1 mg Oral Tablet    Take 0.5 Tablets (0.5 mg total) by mouth Once per day as needed for Anxiety    Patient not  taking:  Reported on 04/24/2023             Allergies:    Allergies   Allergen Reactions    Amoxicillin Anaphylaxis    Adhesive Rash, Itching and Hives/ Urticaria    Nickel Rash and Itching         ED medications:   Medications Administered in the ED   NS flush syringe (3 mL Intracatheter Given 04/24/23 1430)   NS flush syringe (has no administration in time range)   NS flush syringe (3 mL Intracatheter Given 04/24/23 1430)   NS flush syringe (has no administration in time range)   nystatin (NYSTOP) 100,000 units/g topical powder (has no administration in time range)   electrolyte-R (NORMOSOL-R) bolus infusion 30 mL/kg = 2,994 mL (2,994 mL Intravenous New Bag/New Syringe 04/24/23 1430)   levoFLOXacin (LEVAQUIN) 750 mg in D5W 150 mL premix IVPB (has no administration in time range)         Home Meds:      Prior to Admission medications    Medication Sig Start Date End Date Taking? Authorizing Provider   albuterol sulfate (PROVENTIL OR  VENTOLIN OR PROAIR) 90 mcg/actuation Inhalation oral inhaler Take 1-2 Puffs by inhalation Every 6 hours as needed   Yes Provider, Historical   allopurinoL (ZYLOPRIM) 100 mg Oral Tablet Take 1 Tablet (100 mg total) by mouth Once a day for 360 days  Patient not taking: Reported on 04/24/2023 10/10/22 10/05/23  Cydney Ok, NP   ARIPiprazole (ABILIFY) 10 mg Oral Tablet 1 Tablet (10 mg total) Once a day 04/10/23  Yes Provider, Historical   Biotin 1 mg Oral Tablet Take by mouth   Yes Provider, Historical   cyanocobalamin (VITAMIN B12) 1,000 mcg/mL Injection Solution INJECT 1 MILLILITER INTO THE MUSCLE ONCE EVERY MONTH 09/30/22  Yes Provider, Historical   ergocalciferol, vitamin D2, (DRISDOL) 1,250 mcg (50,000 unit) Oral Capsule Take 1 Capsule (50,000 Units total) by mouth Every 7 days   Yes Provider, Historical   furosemide (LASIX) 40 mg Oral Tablet Take 0.5 Tablets (20 mg total) by mouth Once a day 09/02/22  Yes Provider, Historical   lamoTRIgine (LAMICTAL) 25 mg Oral Tablet 8 Tablets (200 mg  total) Once a day 09/30/22  Yes Provider, Historical   LORazepam (ATIVAN) 1 mg Oral Tablet Take 1.5 Tablets (1.5 mg total) by mouth Every night   Yes Provider, Historical   LORazepam (ATIVAN) 1 mg Oral Tablet Take 0.5 Tablets (0.5 mg total) by mouth Once per day as needed for Anxiety  Patient not taking: Reported on 04/24/2023    Provider, Historical   DULoxetine (CYMBALTA DR) 30 mg Oral Capsule, Delayed Release(E.C.) Take 1 Capsule (30 mg total) by mouth Once a day  04/24/23  Provider, Historical          Inpatient Meds:      acetaminophen (TYLENOL) tablet, 650 mg, Oral, Q4H PRN  albuterol 90 mcg per inhalation oral inhaler - "Respiratory to administer", 1 Puff, Inhalation, Q6H PRN  allopurinol (ZYLOPRIM) tablet, 100 mg, Oral, Daily  ARIPiprazole (ABILIFY) tablet, 10 mg, Oral, Daily  docusate sodium (COLACE) capsule, 200 mg, Oral, Daily  heparin 5,000 unit/mL injection, 5,000 Units, Subcutaneous, Q8HRS  lamoTRIgine (LaMICtal) tablet, 200 mg, Oral, Daily  [START ON 04/25/2023] levoFLOXacin (LEVAQUIN) 500 mg in D5W 100 mL premix IVPB, 500 mg, Intravenous, Q24H  LORazepam (ATIVAN) tablet 1.5 mg, 1.5 mg, Oral, NIGHTLY  [START ON 04/25/2023] metroNIDAZOLE (FLAGYL) 500 mg in NS 100 mL premix IVPB, 500 mg, Intravenous, Q12H  NS flush syringe, 3 mL, Intracatheter, Q8HRS  NS flush syringe, 3 mL, Intracatheter, Q1H PRN  NS flush syringe, 3 mL, Intracatheter, Q8HRS  NS flush syringe, 3 mL, Intracatheter, Q1H PRN  NS premix infusion, , Intravenous, Continuous  nystatin (NYSTOP) 100,000 units/g topical powder, , Apply Topically, 2x/day  ondansetron (ZOFRAN) 2 mg/mL injection, 4 mg, Intravenous, Q6H PRN  [START ON 04/25/2023] pantoprazole (PROTONIX) 40 mg delayed release granules for oral suspension, 40 mg, Oral, Daily before Breakfast             PMHx:    Past Medical History:   Diagnosis Date    Arthropathy     Asthma     Bipolar disorder, unspecified (CMS HCC)     Cancer (CMS HCC)     squamous cell    CKD (chronic kidney disease)      Wears glasses         PSHx:   Past Surgical History:   Procedure Laterality Date    HX APPENDECTOMY      HX DILATION AND CURETTAGE      HX HYSTERECTOMY  Family History  Family Medical History:    None        Social History  Social History     Tobacco Use    Smoking status: Every Day     Current packs/day: 0.25     Average packs/day: 0.3 packs/day for 20.0 years (5.0 ttl pk-yrs)     Types: Cigarettes    Smokeless tobacco: Never   Vaping Use    Vaping status: Never Used   Substance Use Topics    Alcohol use: Never    Drug use: Never             Previous GI Procedures:  04/25/2023      Vital Signs:  Temp (24hrs) Max:37.2 C (98.9 F)      Systolic (24hrs), Avg:118 , Min:103 , Max:126     Diastolic (24hrs), Avg:65, Min:44, Max:77    Temp  Avg: 37.1 C (98.7 F)  Min: 36.9 C (98.4 F)  Max: 37.2 C (98.9 F)  MAP (Non-Invasive)  Avg: 79.4 mmHG  Min: 66 mmHG  Max: 90 mmHG  Pulse  Avg: 99.7  Min: 91  Max: 111  Resp  Avg: 21  Min: 17  Max: 27  SpO2  Avg: 96.2 %  Min: 92 %  Max: 100 %           PHYSICAL EXAM:   Temperature: 36.9 C (98.4 F)  Heart Rate: 91  BP (Non-Invasive): (!) 125/44  Respiratory Rate: 20  SpO2: 92 %  Vital signs reviewed  General  No acute distress  Skin color normal  HEENT  Normal, no icterus, neck supple  Lungs clear bilaterally  Card RRR,   Abdomen  BS active, no tenderness, no guarding, no distension,   Ext  no edema       RELEVANT LABS:  Labs:    Results for orders placed or performed during the hospital encounter of 04/24/23 (from the past 24 hour(s))   COMPREHENSIVE METABOLIC PANEL, NON-FASTING   Result Value Ref Range    SODIUM 137 136 - 145 mmol/L    POTASSIUM 4.7 3.5 - 5.1 mmol/L    CHLORIDE 105 98 - 107 mmol/L    CO2 TOTAL 19 (L) 21 - 31 mmol/L    ANION GAP 13 4 - 13 mmol/L    BUN 25 7 - 25 mg/dL    CREATININE 9.52 (H) 0.60 - 1.30 mg/dL    BUN/CREA RATIO 14 6 - 22    ESTIMATED GFR 29 (L) >59 mL/min/1.66m^2    ALBUMIN 3.6 3.5 - 5.7 g/dL    CALCIUM 9.6 8.6 - 84.1 mg/dL    GLUCOSE  324 (H) 74 - 109 mg/dL    ALKALINE PHOSPHATASE 214 (H) 34 - 104 U/L    ALT (SGPT) 17 7 - 52 U/L    AST (SGOT) 27 13 - 39 U/L    BILIRUBIN TOTAL 0.9 0.3 - 1.0 mg/dL    PROTEIN TOTAL 5.9 (L) 6.4 - 8.9 g/dL    ALBUMIN/GLOBULIN RATIO 1.6 (H) 0.8 - 1.4    OSMOLALITY, CALCULATED 279 270 - 290 mOsm/kg    CALCIUM, CORRECTED 9.9 8.9 - 10.8 mg/dL    GLOBULIN 2.3 (L) 2.9 - 5.4   ETHANOL, SERUM/PLASMA   Result Value Ref Range    ETHANOL <10 0 mg/dL   LIPASE   Result Value Ref Range    LIPASE 8 (L) 11 - 82 U/L   CBC WITH DIFF   Result Value Ref Range  WBC 13.5 (H) 3.8 - 11.8 x10^3/uL    RBC 4.66 3.63 - 4.92 x10^6/uL    HGB 15.4 (H) 10.9 - 14.3 g/dL    HCT 69.6 (H) 29.5 - 41.9 %    MCV 97.2 (H) 75.5 - 95.3 fL    MCH 33.0 (H) 24.7 - 32.8 pg    MCHC 33.9 32.3 - 35.6 g/dL    RDW 28.4 13.2 - 44.0 %    PLATELETS 170 140 - 440 x10^3/uL    MPV 9.8 7.9 - 10.8 fL   SCAN DIFFERENTIAL   Result Value Ref Range    RBC MORPHOLOGY COMMENT Normal     PLATELET MORPHOLOGY COMMENT Normal    ARTERIAL BLOOD GAS/LACTATE   Result Value Ref Range    PH (ARTERIAL) 7.41 7.35 - 7.45    PCO2 (ARTERIAL) 34 (L) 35 - 45 mm/Hg    BICARBONATE (ARTERIAL) 22.0 20.0 - 26.0 mmol/L    MET-HEMOGLOBIN 0.1 <=2.0 %    LACTATE 0.8 <=2.0 mmol/L    CARBOXYHEMOGLOBIN 1.3 <=1.5 %    O2CT 20.4 %    %FIO2 (ARTERIAL) 21 %    PO2 (ARTERIAL) 73 (L) 80 - 100 mm/Hg    OXYHEMOGLOBIN 94.3 88.0 - 100.0 %    ALLEN TEST yes     DRAW SITE right radial     BASE DEFICIT 3.8 (H) 0.0 - 2.0 mmol/L   LACTIC ACID LEVEL W/ REFLEX FOR LEVEL >2.0   Result Value Ref Range    LACTIC ACID 1.0 0.5 - 2.2 mmol/L   AMMONIA   Result Value Ref Range    AMMONIA 20 16 - 53 umol/L   URINALYSIS, MACROSCOPIC   Result Value Ref Range    COLOR Yellow Colorless, Light Yellow, Yellow    APPEARANCE Clear Clear    SPECIFIC GRAVITY 1.005 1.002 - 1.030    PH 5.5 5.0 - 9.0    LEUKOCYTES Negative Negative, 100  WBCs/uL    NITRITE Negative Negative    PROTEIN Negative Negative, 10 , 20  mg/dL    GLUCOSE Negative  Negative, 30  mg/dL    KETONES Negative Negative, Trace mg/dL    BILIRUBIN Negative Negative, 0.5 mg/dL    BLOOD Negative Negative, 0.03 mg/dL    UROBILINOGEN Normal Normal mg/dL   URINALYSIS, MICROSCOPIC   Result Value Ref Range    BACTERIA Negative Negative /hpf    MUCOUS Rare Rare, Occasional, Few /hpf    BUDDING YEAST Rare (A) (none) /hpf    RBCS <1 <4 /hpf    WBCS <1 <6 /hpf    SQUAMOUS EPITHELIAL <1 <28 /hpf          No results found for: "CARCIEMBAG", "MQAD24", "CAAGGICA199", "CA199"   CBC  Diff   Lab Results   Component Value Date/Time    WBC 13.5 (H) 04/24/2023 01:12 PM    HGB 15.4 (H) 04/24/2023 01:12 PM    HCT 45.3 (H) 04/24/2023 01:12 PM    PLTCNT 170 04/24/2023 01:12 PM    RBC 4.66 04/24/2023 01:12 PM    MCV 97.2 (H) 04/24/2023 01:12 PM    MCHC 33.9 04/24/2023 01:12 PM    MCH 33.0 (H) 04/24/2023 01:12 PM    RDW 13.2 04/24/2023 01:12 PM    MPV 9.8 04/24/2023 01:12 PM    Lab Results   Component Value Date/Time    PMNS 51 03/17/2023 01:42 PM    LYMPHOCYTES 35 03/17/2023 01:42 PM    EOSINOPHIL 6 03/17/2023 01:42 PM  MONOCYTES 7 03/17/2023 01:42 PM    BASOPHILS 1 03/17/2023 01:42 PM    BASOPHILS 0.10 03/17/2023 01:42 PM    PMNABS 5.30 03/17/2023 01:42 PM    LYMPHSABS 3.60 (H) 03/17/2023 01:42 PM    EOSABS 0.60 (H) 03/17/2023 01:42 PM    MONOSABS 0.70 03/17/2023 01:42 PM           No results found for: "CARCIEMBAG" @LASTIFOB @    Imaging Studies:  No results found for this or any previous visit (from the past 427062376 hour(s)).  No results found for this or any previous visit (from the past 283151761 hour(s)).  Recent Results (from the past 607371062 hour(s))   CT ABDOMEN PELVIS WO IV CONTRAST    Collection Time: 04/24/23  2:43 PM    Narrative    Lexine Baton [place]    RADIOLOGIST: Alvester Chou, MD    CT ABDOMEN PELVIS WO IV CONTRAST performed on 04/24/2023 2:43 PM    CLINICAL HISTORY: Decreased appetite, pain in the pelvic region.  Lower abdominal and pelvic pain  Decreased appetite, pain in the pelvic  region, constipation    TECHNIQUE:  Abdomen and pelvis CT without intravenous contrast.    COMPARISON:  None.    FINDINGS:  Noncontrast technique limits evaluation of the abdominal and pelvic viscera.    Lung bases: Clear    Liver:   There is some subtle marginal nodularity of the liver suggesting cirrhotic morphology. There is a 1 cm low-density lesion in the right lobe of the liver which is too small to characterize.    Gallbladder:   There are calcified gallstones in the gallbladder with some gallbladder distention. No surrounding inflammatory changes are identified.    Spleen:   Unremarkable.    Pancreas:   Unremarkable.    Adrenals:   Unremarkable.    Kidneys:   Unremarkable.    Bladder:  Unremarkable.    Uterus and Adnexa:  Prior hysterectomy.  Adnexal regions are unremarkable.    Bowel:   There is some wall thickening in the distal esophagus. This could be directly visualized.    There is a moderate amount of fecal matter throughout the colon. No colonic or small bowel distention. No evidence of bowel obstruction.    There is some colonic diverticulosis present.Marland Kitchen    Appendix:  Not visualized    Lymph nodes:  No suspicious lymph node enlargement.    Vasculature:   Mild diffuse atherosclerotic calcifications are noted.     Peritoneum / Retroperitoneum: No ascites.  No free air.    Bones:   Degenerative changes of the spine.          Impression    1. CHOLELITHIASIS WITH GALLBLADDER DISTENTION. NO SURROUNDING INFLAMMATORY CHANGES  2. WALL THICKENING IN THE DISTAL ESOPHAGUS COULD BE DIRECTLY VISUALIZED  3. CIRRHOTIC MORPHOLOGY OF THE LIVER       One or more dose reduction techniques were used (e.g., Automated exposure control, adjustment of the mA and/or kV according to patient size, use of iterative reconstruction technique).      Radiologist location ID: [id number]       Recent Results (from the past [id number] hour(s))   MRI KIDNEY MASS WO CONTRAST    Collection Time: 12/18/22  2:42 PM    Narrative    Lexine Baton  [place]    RADIOLOGIST: Markus Jarvis, MD    MRI KIDNEY MASS WO CONTRAST performed on 12/18/2022 2:42 PM    CLINICAL HISTORY: N28.89: Left renal mass.  left renal mass    TECHNIQUE:  Noncontrast 'kidney' protocol abdominal MRI. This is a limited exam.    COMPARISON:  None.    FINDINGS:  Kidneys: The kidneys are normally positioned, and the renal sizes are grossly symmetric. There is no hydronephrosis. Multiple tiny cystic lesions can be seen throughout both kidneys too numerous to count in most too small to measure accurately. The largest left cyst is about 5 mm in diameter and the largest right cyst is 9.6 mm in diameter.    Lymph Nodes: No para-aortic lymphadenopathy.    The noncontrast appearances of the visualized liver, spleen, pancreas, and adrenals are grossly unremarkable. No free fluid at visualized levels.        Impression    MULTIPLE TINY CYSTS SCATTERED THROUGHOUT BOTH KIDNEYS. NO SOLID APPEARING MASS WAS IDENTIFIED.        Radiologist location ID: [id number]       No results found for this or any previous visit (from the past 102725366 hour(s)).  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  CKD    TECHNIQUE: Bilateral renal ultrasound.    COMPARISON: None.    FINDINGS:  RIGHT Kidney       Size: 8.4 cm x 3.2 cm x 3.9 cm        Volume: 54 ml    LEFT Kidney       Size: 9.2 cm x 3.1 cm x 3.8 cm        Volume: 57 ml    Right kidney:  Small with senescent changes present.   Hydronephrosis: None.  Perinephric fluid: None.  Cysts: None.  Solid lesions: None.  Calcifications: None.    Left kidney:  Small with senescent changes present.   Hydronephrosis: None.  Perinephric fluid: None.  Cysts:  1. 2 cm mid  renal cyst..  Solid lesions:  1. 2.8 cm hypoechoic midpole mass suggested, and noncystic. Mildly inhomogeneous..  Calcifications: None.    Loss of cortical medullary differentiation suggests chronic renal disease.      Impression    Senescent changes.  Left renal cyst and left renal noncystic mass.        Radiologist location ID: [id number]         Assessment/Plan and Recommendations:   Abdominal pain  2.    Abnormal CT scan - wall thickening of esophagus, ? Cirrhosis of liver, gallstones  3.    Constipation    Plan:  EGD tomorrow to further evaluate the abdominal pain and wall thickening of esophagus. She reports she had a colonoscopy in the recent past. LFTs are normal except for elevated Alk Phos (? From bone origin vs liver). May need MRCP.     Rogers Seeds, MD

## 2023-04-24 NOTE — Care Plan (Signed)
Problem: Adult Inpatient Plan of Care  Goal: Plan of Care Review  Outcome: Ongoing (see interventions/notes)  Goal: Patient-Specific Goal (Individualized)  Outcome: Ongoing (see interventions/notes)  Flowsheets (Taken 04/24/2023 2000)  Patient would like to participate in bedside shift report: Yes  Individualized Care Needs: monitor vitals and labs  Anxieties, Fears or Concerns: none at this time  Patient-Specific Goals (Include Timeframe): to go h ome  Plan of Care Reviewed With: patient  Goal: Absence of Hospital-Acquired Illness or Injury  Outcome: Ongoing (see interventions/notes)  Intervention: Identify and Manage Fall Risk  Recent Flowsheet Documentation  Taken 04/24/2023 2000 by Avelino Leeds, RN  Safety Promotion/Fall Prevention: activity supervised  Intervention: Prevent Skin Injury  Recent Flowsheet Documentation  Taken 04/24/2023 2259 by Avelino Leeds, RN  Skin Protection:   adhesive use limited   tubing/devices free from skin contact  Taken 04/24/2023 2000 by Avelino Leeds, RN  Skin Protection:   adhesive use limited   incontinence pads utilized  Goal: Optimal Comfort and Wellbeing  Outcome: Ongoing (see interventions/notes)  Intervention: Provide Person-Centered Care  Recent Flowsheet Documentation  Taken 04/24/2023 2000 by Avelino Leeds, RN  Trust Relationship/Rapport:   care explained   emotional support provided   empathic listening provided   questions answered   questions encouraged  Goal: Rounds/Family Conference  Outcome: Ongoing (see interventions/notes)     Problem: Health Knowledge, Opportunity to Enhance (Adult,Obstetrics,Pediatric)  Goal: Knowledgeable about Health Subject/Topic  Description: Patient will demonstrate the desired outcomes by discharge/transition of care.  Outcome: Ongoing (see interventions/notes)  Intervention: Enhance Health Knowledge  Recent Flowsheet Documentation  Taken 04/24/2023 2000 by Avelino Leeds, RN  Family/Support System Care:   self-care encouraged   support provided     Problem:  Skin Injury Risk Increased  Goal: Skin Health and Integrity  Outcome: Ongoing (see interventions/notes)  Intervention: Optimize Skin Protection  Recent Flowsheet Documentation  Taken 04/24/2023 2259 by Avelino Leeds, RN  Pressure Reduction Techniques: Patient turned q 2 hours  Pressure Reduction Devices: Repositioning wedges/pillows utilized  Skin Protection:   adhesive use limited   tubing/devices free from skin contact  Taken 04/24/2023 2000 by Avelino Leeds, RN  Pressure Reduction Techniques: Heels elevated off of the bed  Pressure Reduction Devices: Repositioning wedges/pillows utilized  Skin Protection:   adhesive use limited   incontinence pads utilized     Problem: Fall Injury Risk  Goal: Absence of Fall and Fall-Related Injury  Outcome: Ongoing (see interventions/notes)  Intervention: Identify and Manage Contributors  Recent Flowsheet Documentation  Taken 04/24/2023 2000 by Avelino Leeds, RN  Medication Review/Management: medications reviewed  Intervention: Promote Injury-Free Environment  Recent Flowsheet Documentation  Taken 04/24/2023 2000 by Avelino Leeds, RN  Safety Promotion/Fall Prevention: activity supervised

## 2023-04-24 NOTE — Consults (Signed)
[facility]  General Surgery  Consultation    Date of Service:  04/24/2023  Paula, Cochran, 77 y.o. female  Date of Admission:  04/24/2023  Date of Birth:  [birth year]  PCP: Emilia Beck, DO    Reason for Consultation:  Gallstones    HPI:  Paula Cochran is a 77 y.o. White female whom I was asked to see for consultation regarding gallstones noted on CT scan incidentally.  Patient presented ER because of weakness and frequent falls.  The patient did report decreased appetite but no nausea or vomiting.    Past Medical History:   Diagnosis Date    Arthropathy     Asthma     Bipolar disorder, unspecified (CMS HCC)     Cancer (CMS HCC)     squamous cell    CKD (chronic kidney disease)     Wears glasses       Past Surgical History:   Procedure Laterality Date    HX APPENDECTOMY      HX DILATION AND CURETTAGE      HX HYSTERECTOMY        Social History     Tobacco Use    Smoking status: Every Day     Current packs/day: 0.25     Average packs/day: 0.3 packs/day for 20.0 years (5.0 ttl pk-yrs)     Types: Cigarettes    Smokeless tobacco: Never   Vaping Use    Vaping status: Never Used   Substance Use Topics    Alcohol use: Never    Drug use: Never       Family Medical History:    None        Medications Prior to Admission       Prescriptions    albuterol sulfate (PROVENTIL OR VENTOLIN OR PROAIR) 90 mcg/actuation Inhalation oral inhaler    Take 1-2 Puffs by inhalation Every 6 hours as needed    allopurinoL (ZYLOPRIM) 100 mg Oral Tablet    Take 1 Tablet (100 mg total) by mouth Once a day for 360 days    Patient not taking:  Reported on 04/24/2023    ARIPiprazole (ABILIFY) 10 mg Oral Tablet    1 Tablet (10 mg total) Once a day    Biotin 1 mg Oral Tablet    Take by mouth    cyanocobalamin (VITAMIN B12) 1,000 mcg/mL Injection Solution    INJECT 1 MILLILITER INTO THE MUSCLE ONCE EVERY MONTH    ergocalciferol, vitamin D2, (DRISDOL) 1,250 mcg (50,000 unit) Oral Capsule    Take 1 Capsule (50,000 Units total) by  mouth Every 7 days    furosemide (LASIX) 40 mg Oral Tablet    Take 0.5 Tablets (20 mg total) by mouth Once a day    lamoTRIgine (LAMICTAL) 25 mg Oral Tablet    8 Tablets (200 mg total) Once a day    LORazepam (ATIVAN) 1 mg Oral Tablet    Take 1.5 Tablets (1.5 mg total) by mouth Every night    LORazepam (ATIVAN) 1 mg Oral Tablet    Take 0.5 Tablets (0.5 mg total) by mouth Once per day as needed for Anxiety    Patient not taking:  Reported on 04/24/2023           Allergies   Allergen Reactions    Amoxicillin Anaphylaxis    Adhesive Rash, Itching and Hives/ Urticaria    Nickel Rash and Itching          Patient Vitals for  the past 24 hrs:   BP Temp Pulse Resp SpO2 Height Weight   04/24/23 2011 (!) 125/44 36.9 C (98.4 F) 91 20 92 % 1.575 m (5\' 2" ) 82.5 kg (181 lb 14.1 oz)   04/24/23 2000 -- -- -- 20 -- -- --   04/24/23 1954 -- -- 91 -- -- -- --   04/24/23 1800 119/77 -- 92 (!) 24 96 % -- --   04/24/23 1600 119/77 -- (!) 104 (!) 27 97 % -- --   04/24/23 1530 103/63 -- 100 17 96 % -- --   04/24/23 1400 -- -- -- -- -- 1.575 m (5\' 2" ) 99.8 kg (220 lb)   04/24/23 1301 -- -- (!) 109 20 100 % -- --   04/24/23 1300 (!) 113/59 -- -- -- -- -- --   04/24/23 1247 126/69 37.2 C (98.9 F) (!) 111 18 96 % 1.575 m (5\' 2" ) 99.8 kg (220 lb)          General: appropriate for age. in no acute distress.    Vital signs are present above and have been reviewed by me     HEENT: Atraumatic, Normocephalic. PERRLA. EOMI. Nose clear. Throat clear    Lungs: Nonlabored breathing with symmetric expansion. Clear to auscultation bilaterally    Heart:Regular wth respect to rate and rythmn.    Abdomen:Soft. Nontender. Nondistended and completely benign    Extremities: Grossly normal with no major deformities.    Neuro: Grossly normal motor and sensory function. CN's II through XII intact.    Psychiatric: Alert and oriented to person, place, and time. affect appropriate    Laboratory Data:     Results for orders placed or performed during the hospital  encounter of 04/24/23 (from the past 24 hour(s))   COMPREHENSIVE METABOLIC PANEL, NON-FASTING   Result Value Ref Range    SODIUM 137 136 - 145 mmol/L    POTASSIUM 4.7 3.5 - 5.1 mmol/L    CHLORIDE 105 98 - 107 mmol/L    CO2 TOTAL 19 (L) 21 - 31 mmol/L    ANION GAP 13 4 - 13 mmol/L    BUN 25 7 - 25 mg/dL    CREATININE 1.61 (H) 0.60 - 1.30 mg/dL    BUN/CREA RATIO 14 6 - 22    ESTIMATED GFR 29 (L) >59 mL/min/1.24m^2    ALBUMIN 3.6 3.5 - 5.7 g/dL    CALCIUM 9.6 8.6 - 09.6 mg/dL    GLUCOSE 045 (H) 74 - 109 mg/dL    ALKALINE PHOSPHATASE 214 (H) 34 - 104 U/L    ALT (SGPT) 17 7 - 52 U/L    AST (SGOT) 27 13 - 39 U/L    BILIRUBIN TOTAL 0.9 0.3 - 1.0 mg/dL    PROTEIN TOTAL 5.9 (L) 6.4 - 8.9 g/dL    ALBUMIN/GLOBULIN RATIO 1.6 (H) 0.8 - 1.4    OSMOLALITY, CALCULATED 279 270 - 290 mOsm/kg    CALCIUM, CORRECTED 9.9 8.9 - 10.8 mg/dL    GLOBULIN 2.3 (L) 2.9 - 5.4   ETHANOL, SERUM/PLASMA   Result Value Ref Range    ETHANOL <10 0 mg/dL   LIPASE   Result Value Ref Range    LIPASE 8 (L) 11 - 82 U/L   CBC WITH DIFF   Result Value Ref Range    WBC 13.5 (H) 3.8 - 11.8 x10^3/uL    RBC 4.66 3.63 - 4.92 x10^6/uL    HGB 15.4 (H) 10.9 - 14.3 g/dL    HCT  45.3 (H) 31.2 - 41.9 %    MCV 97.2 (H) 75.5 - 95.3 fL    MCH 33.0 (H) 24.7 - 32.8 pg    MCHC 33.9 32.3 - 35.6 g/dL    RDW 02.7 25.3 - 66.4 %    PLATELETS 170 140 - 440 x10^3/uL    MPV 9.8 7.9 - 10.8 fL   SCAN DIFFERENTIAL   Result Value Ref Range    RBC MORPHOLOGY COMMENT Normal     PLATELET MORPHOLOGY COMMENT Normal    ARTERIAL BLOOD GAS/LACTATE   Result Value Ref Range    PH (ARTERIAL) 7.41 7.35 - 7.45    PCO2 (ARTERIAL) 34 (L) 35 - 45 mm/Hg    BICARBONATE (ARTERIAL) 22.0 20.0 - 26.0 mmol/L    MET-HEMOGLOBIN 0.1 <=2.0 %    LACTATE 0.8 <=2.0 mmol/L    CARBOXYHEMOGLOBIN 1.3 <=1.5 %    O2CT 20.4 %    %FIO2 (ARTERIAL) 21 %    PO2 (ARTERIAL) 73 (L) 80 - 100 mm/Hg    OXYHEMOGLOBIN 94.3 88.0 - 100.0 %    ALLEN TEST yes     DRAW SITE right radial     BASE DEFICIT 3.8 (H) 0.0 - 2.0 mmol/L   LACTIC ACID  LEVEL W/ REFLEX FOR LEVEL >2.0   Result Value Ref Range    LACTIC ACID 1.0 0.5 - 2.2 mmol/L   AMMONIA   Result Value Ref Range    AMMONIA 20 16 - 53 umol/L   URINALYSIS, MACROSCOPIC   Result Value Ref Range    COLOR Yellow Colorless, Light Yellow, Yellow    APPEARANCE Clear Clear    SPECIFIC GRAVITY 1.005 1.002 - 1.030    PH 5.5 5.0 - 9.0    LEUKOCYTES Negative Negative, 100  WBCs/uL    NITRITE Negative Negative    PROTEIN Negative Negative, 10 , 20  mg/dL    GLUCOSE Negative Negative, 30  mg/dL    KETONES Negative Negative, Trace mg/dL    BILIRUBIN Negative Negative, 0.5 mg/dL    BLOOD Negative Negative, 0.03 mg/dL    UROBILINOGEN Normal Normal mg/dL   URINALYSIS, MICROSCOPIC   Result Value Ref Range    BACTERIA Negative Negative /hpf    MUCOUS Rare Rare, Occasional, Few /hpf    BUDDING YEAST Rare (A) (none) /hpf    RBCS <1 <4 /hpf    WBCS <1 <6 /hpf    SQUAMOUS EPITHELIAL <1 <28 /hpf       Imaging Studies:    XR AP MOBILE CHEST   Final Result by Edi, Radresults In (09/19 1549)   NO ACUTE FINDINGS ARE IDENTIFIED WHEN COMPARED TO 08/30/2022.            Radiologist location ID: [id number]         CT ABDOMEN PELVIS WO IV CONTRAST   Final Result by Edi, Radresults In (09/19 1455)   1. CHOLELITHIASIS WITH GALLBLADDER DISTENTION. NO SURROUNDING INFLAMMATORY CHANGES   2. WALL THICKENING IN THE DISTAL ESOPHAGUS COULD BE DIRECTLY VISUALIZED   3. CIRRHOTIC MORPHOLOGY OF THE LIVER          One or more dose reduction techniques were used (e.g., Automated exposure control, adjustment of the mA and/or kV according to patient size, use of iterative reconstruction technique).         Radiologist location ID: [id number]         CT BRAIN WO IV CONTRAST   Final Result by Edi, Radresults In (09/19 1451)   NO  ACUTE FINDINGS         One or more dose reduction techniques were used (e.g., Automated exposure control, adjustment of the mA and/or kV according to patient size, use of iterative reconstruction technique).         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 In such instances, original meaning may be extrapolated by contextual derivation.    Fidela Juneau, MD, MBA, FACS

## 2023-04-24 NOTE — ED Nurses Note (Signed)
Soap suds enema performed at bedside. Patient tolerated majority of treatment. Call light within reach, family at bedside

## 2023-04-24 NOTE — ED Triage Notes (Signed)
Weakness x 3 weeks. Patient reports falls at the beginning of the month and has only been getting up to use the bathroom.    PRS: Transport, BS 120

## 2023-04-24 NOTE — H&P (Signed)
[facility]  Admission H&P        Date of Service:  04/24/2023  Paula Cochran y.o. female  Date of Admission:  04/24/2023  Date of Birth:  14-Jul-1946      Chief Complaint:    Chief Complaint   Patient presents with    Weakness       HPI: Paula Cochran is a 77 y.o., White female who presents with falls and weakness.  Patient reports that for the past few weeks she has had increased weakness and multiple falls at home.  States that she was ambulating with a walker prior to that at home.  She lives with her husband.  She reports decreased appetite.  Reports constipation, states that she does not think she has had a bowel movement in over a week.  She denies abdominal pain.  CT of the abdomen and pelvis was performed, showed cholelithiasis with gallbladder distention.  No surrounding inflammatory changes.  Walls thickened in the distal esophagus.  Cirrhotic morphology of the liver.  Patient reports that she had some psych medications changed for her bipolar approximately 3 weeks ago and feels like her symptoms seemed to start around that time.  Patient was seen by General surgery in the emergency department.  He did evaluate and reported the patient does not need cholecystectomy at this time.  Recommended GI consult.  Patient also had some yeast in the groin area.  She is not diabetic.  WBCs 13.5.  Creatinine 1.79, GFR 29.  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  Patient was given IV fluids, IV Levaquin and Flagyl in the emergency department.  She was given fleets enema as well.    History:    Past Medical:    Past Medical History:   Diagnosis Date    Arthropathy     Asthma     Bipolar disorder, unspecified (CMS HCC)     Cancer (CMS HCC)     squamous cell    CKD (chronic kidney disease)     Wears glasses      Past Surgical:    Past Surgical History:   Procedure Laterality Date    HX APPENDECTOMY      HX DILATION AND CURETTAGE      HX HYSTERECTOMY       Family:    Family  Medical History:    None       Social:   reports that she has been smoking cigarettes. She has a 5 pack-year smoking history. She has never used smokeless tobacco. She reports that she does not drink alcohol and does not use drugs.    Allergies   Allergen Reactions    Amoxicillin Anaphylaxis    Adhesive Rash, Itching and Hives/ Urticaria    Nickel Rash and Itching     Medications Prior to Admission       Prescriptions    albuterol sulfate (PROVENTIL OR VENTOLIN OR PROAIR) 90 mcg/actuation Inhalation oral inhaler    Take 1-2 Puffs by inhalation Every 6 hours as needed    allopurinoL (ZYLOPRIM) 100 mg Oral Tablet    Take 1 Tablet (100 mg total) by mouth Once a day for 360 days    ARIPiprazole (ABILIFY) 10 mg Oral Tablet    1 Tablet (10 mg total) Once a day    Biotin 1 mg Oral Tablet    Take by mouth    cyanocobalamin (VITAMIN B12) 1,000 mcg/mL Injection Solution  INJECT 1 MILLILITER INTO THE MUSCLE ONCE EVERY MONTH    ergocalciferol, vitamin D2, (DRISDOL) 1,250 mcg (50,000 unit) Oral Capsule    Take 1 Capsule (50,000 Units total) by mouth Every 7 days    furosemide (LASIX) 40 mg Oral Tablet    Take 0.5 Tablets (20 mg total) by mouth Once a day    lamoTRIgine (LAMICTAL) 25 mg Oral Tablet    8 Tablets (200 mg total) Once a day    LORazepam (ATIVAN) 1 mg Oral Tablet    Take 1.5 Tablets (1.5 mg total) by mouth Every night    LORazepam (ATIVAN) 1 mg Oral Tablet    Take 0.5 Tablets (0.5 mg total) by mouth Once per day as needed for Anxiety    Patient not taking:  Reported on 04/24/2023          acetaminophen (TYLENOL) tablet, 650 mg, Oral, Q4H PRN  albuterol 90 mcg per inhalation oral inhaler - "Respiratory to administer", 1 Puff, Inhalation, Q6H PRN  allopurinol (ZYLOPRIM) tablet, 100 mg, Oral, Daily  ARIPiprazole (ABILIFY) tablet, 10 mg, Oral, Daily  docusate sodium (COLACE) capsule, 200 mg, Oral, Daily  heparin 5,000 unit/mL injection, 5,000 Units, Subcutaneous, Q8HRS  lamoTRIgine (LaMICtal) tablet, 200 mg, Oral,  Daily  [START ON 04/25/2023] levoFLOXacin (LEVAQUIN) 500 mg in D5W 100 mL premix IVPB, 500 mg, Intravenous, Q24H  levoFLOXacin (LEVAQUIN) 750 mg in D5W 150 mL premix IVPB, 750 mg, Intravenous, Now  LORazepam (ATIVAN) tablet 1.5 mg, 1.5 mg, Oral, NIGHTLY  metroNIDAZOLE (FLAGYL) 500 mg in NS 100 mL premix IVPB, 500 mg, Intravenous, Now  [START ON 04/25/2023] metroNIDAZOLE (FLAGYL) 500 mg in NS 100 mL premix IVPB, 500 mg, Intravenous, Q12H  NS flush syringe, 3 mL, Intracatheter, Q8HRS  NS flush syringe, 3 mL, Intracatheter, Q1H PRN  NS flush syringe, 3 mL, Intracatheter, Q8HRS  NS flush syringe, 3 mL, Intracatheter, Q1H PRN  NS premix infusion, , Intravenous, Continuous  nystatin (NYSTOP) 100,000 units/g topical powder, , Apply Topically, 2x/day  ondansetron (ZOFRAN) 2 mg/mL injection, 4 mg, Intravenous, Q6H PRN  [START ON 04/25/2023] pantoprazole (PROTONIX) 40 mg delayed release granules for oral suspension, 40 mg, Oral, Daily before Breakfast        ROS:   Review of Systems   Constitutional: Negative for chills, diaphoresis and fever.   HENT: Negative for congestion, sinus pain, sore throat and tinnitus.    Eyes: Negative for blurred vision and double vision.   Respiratory: Negative for cough, hemoptysis, sputum production, shortness of breath and wheezing.    Cardiovascular: Negative for chest pain, palpitations, orthopnea and leg swelling.   Gastrointestinal:  Positive for constipation   Genitourinary: Negative for dysuria, flank pain and hematuria.   Musculoskeletal: Negative for joint pain and myalgias.   Skin: Negative for rash.   Neurological: Negative for dizziness, focal weakness and seizures.   Endo/Heme/Allergies: Negative for environmental allergies.   Psychiatric/Behavioral: Negative for depression, substance abuse and suicidal ideas.     All other systems negative unless marked.       Exam:  Vitals:    04/24/23 1301 04/24/23 1400 04/24/23 1530 04/24/23 1600   BP:   103/63 119/77   Pulse: (!) 109  100 (!)  104   Resp: 20  17 (!) 27   Temp:       SpO2: 100%  96% 97%   Weight:  99.8 kg (220 lb)     Height:  1.575 m (5\' 2" )     BMI:  40.32  No intake or output data in the 24 hours ending 04/24/23 1644     Physical Exam   Constitutional: Patient is oriented to person, place, and time and well-developed, well-nourished, and in no distress.   HENT: Mucus membranes moist.  No oral ulcer.  Head: Normocephalic and atraumatic.   Mouth/Throat: Oropharynx is clear and moist.   Eyes: Pupils are equal, round, and reactive to light. Conjunctivae and EOM are normal.   Cardiovascular: Normal rate, regular rhythm and normal heart sounds. Exam reveals no gallop and no friction rub.   No murmur heard.  Pulmonary/Chest: Breath sounds normal. No respiratory distress. No wheezes. No rales. Exhibits no tenderness.   Abdominal: Soft. Bowel sounds are normal. Patient exhibits no distension. There is no abdominal tenderness. There is no rebound and no guarding.   Musculoskeletal: Normal range of motion.      General: No deformity or edema.   Neurological: Patient is alert and oriented to person, place, and time.   Skin: Skin is warm and dry. No erythema.   Psychiatric: Affect and judgment normal.     Labs:     Results for orders placed or performed during the hospital encounter of 04/24/23 (from the past 24 hour(s))   CBC/DIFF    Narrative    The following orders were created for panel order CBC/DIFF.  Procedure                               Abnormality         Status                     ---------                               -----------         ------                     CBC WITH KVQQ[[id number]]                Abnormal            Final result                 Please view results for these tests on the individual orders.   COMPREHENSIVE METABOLIC PANEL, NON-FASTING   Result Value Ref Range    SODIUM 137 136 - 145 mmol/L    POTASSIUM 4.7 3.5 - 5.1 mmol/L    CHLORIDE 105 98 - 107 mmol/L    CO2 TOTAL 19 (L) 21 - 31 mmol/L    ANION GAP 13  4 - 13 mmol/L    BUN 25 7 - 25 mg/dL    CREATININE 4.33 (H) 0.60 - 1.30 mg/dL    BUN/CREA RATIO 14 6 - 22    ESTIMATED GFR 29 (L) >59 mL/min/1.68m^2    ALBUMIN 3.6 3.5 - 5.7 g/dL    CALCIUM 9.6 8.6 - 29.5 mg/dL    GLUCOSE 188 (H) 74 - 109 mg/dL    ALKALINE PHOSPHATASE 214 (H) 34 - 104 U/L    ALT (SGPT) 17 7 - 52 U/L    AST (SGOT) 27 13 - 39 U/L    BILIRUBIN TOTAL 0.9 0.3 - 1.0 mg/dL    PROTEIN TOTAL 5.9 (L) 6.4 - 8.9 g/dL    ALBUMIN/GLOBULIN RATIO 1.6 (H) 0.8 - 1.4  OSMOLALITY, CALCULATED 279 270 - 290 mOsm/kg    CALCIUM, CORRECTED 9.9 8.9 - 10.8 mg/dL    GLOBULIN 2.3 (L) 2.9 - 5.4    Narrative    Estimated Glomerular Filtration Rate (eGFR) is calculated using the CKD-EPI (2021) equation, intended for patients 65 years of age and older. If gender is not documented or "unknown", there will be no eGFR calculation.     ETHANOL, SERUM/PLASMA   Result Value Ref Range    ETHANOL <10 0 mg/dL   URINALYSIS, MACROSCOPIC AND MICROSCOPIC W/CULTURE REFLEX    Specimen: Urine, Clean Catch    Narrative    The following orders were created for panel order URINALYSIS, MACROSCOPIC AND MICROSCOPIC W/CULTURE REFLEX.  Procedure                               Abnormality         Status                     ---------                               -----------         ------                     URINALYSIS, MACROSCOPIC[[id number]]                                                     URINALYSIS, MICROSCOPIC[[id number]]                                                       Please view results for these tests on the individual orders.   CBC WITH DIFF   Result Value Ref Range    WBC 13.5 (H) 3.8 - 11.8 x10^3/uL    RBC 4.66 3.63 - 4.92 x10^6/uL    HGB 15.4 (H) 10.9 - 14.3 g/dL    HCT 25.9 (H) 56.3 - 41.9 %    MCV 97.2 (H) 75.5 - 95.3 fL    MCH 33.0 (H) 24.7 - 32.8 pg    MCHC 33.9 32.3 - 35.6 g/dL    RDW 87.5 64.3 - 32.9 %    PLATELETS 170 140 - 440 x10^3/uL    MPV 9.8 7.9 - 10.8 fL   LIPASE   Result Value Ref Range    LIPASE 8 (L) 11 - 82 U/L   SCAN  DIFFERENTIAL   Result Value Ref Range    RBC MORPHOLOGY COMMENT Normal     PLATELET MORPHOLOGY COMMENT Normal    LACTIC ACID LEVEL W/ REFLEX FOR LEVEL >2.0   Result Value Ref Range    LACTIC ACID 1.0 0.5 - 2.2 mmol/L   ARTERIAL BLOOD GAS/LACTATE   Result Value Ref Range    PH (ARTERIAL) 7.41 7.35 - 7.45    PCO2 (ARTERIAL) 34 (L) 35 - 45 mm/Hg    BICARBONATE (ARTERIAL) 22.0 20.0 - 26.0 mmol/L    MET-HEMOGLOBIN 0.1 <=2.0 %    LACTATE 0.8 <=2.0 mmol/L  CARBOXYHEMOGLOBIN 1.3 <=1.5 %    O2CT 20.4 %    %FIO2 (ARTERIAL) 21 %    PO2 (ARTERIAL) 73 (L) 80 - 100 mm/Hg    OXYHEMOGLOBIN 94.3 88.0 - 100.0 %    ALLEN TEST yes     DRAW SITE right radial     BASE DEFICIT 3.8 (H) 0.0 - 2.0 mmol/L    Narrative    .  .  .  .  .  .        Imaging Studies:    XR AP MOBILE CHEST   Final Result   NO ACUTE FINDINGS ARE IDENTIFIED WHEN COMPARED TO 08/30/2022.            Radiologist location ID: [id number]         CT ABDOMEN PELVIS WO IV CONTRAST   Final Result   1. CHOLELITHIASIS WITH GALLBLADDER DISTENTION. NO SURROUNDING INFLAMMATORY CHANGES   2. WALL THICKENING IN THE DISTAL ESOPHAGUS COULD BE DIRECTLY VISUALIZED   3. CIRRHOTIC MORPHOLOGY OF THE LIVER          One or more dose reduction techniques were used (e.g., Automated exposure control, adjustment of the mA and/or kV according to patient size, use of iterative reconstruction technique).         Radiologist location ID: [id number]         CT BRAIN WO IV CONTRAST   Final Result   NO ACUTE FINDINGS         One or more dose reduction techniques were used (e.g., Automated exposure control, adjustment of the mA and/or kV according to patient size, use of iterative reconstruction technique).         Radiologist location ID: [id number]             Code Status:  No Order    Assessment/Plan:   Active Hospital Problems    Diagnosis    Primary Problem: Weakness generalized    Constipation    Fall    Cholelithiases    Esophageal thickening    CKD (chronic kidney disease) stage 4, GFR 15-29  ml/min (CMS HCC)     1. Generalized weakness/falls at home  -PT, OT consult.  Case management consult.  Falls precautions.  -we will obtain a psych consult to evaluate medication changes and see if this may be related  2. Constipation  -Fleet's enema and Mag citrate ordered.  GI consult in place.  3. Cholelithiasis  -Flagyl and Levaquin IV.  Surgical consult was placed.  No immediate surgical intervention required.  Recommended GI consult.  4. Esophageal thickening on CT  -GI consult placed, PPI  5. chronic kidney disease stage 4   -patient at baseline.  Gentle IV fluid hydration    DVT/PE Prophylaxis:  Heparin  Disposition Planning:  Patient is acutely ill requiring inpatient admission for above diagnoses.  Expect greater than 48 hour hospitalization    Tienna Bienkowski, FNP-BC    This note was partially generated using MModal Fluency Direct system, and there may be some incorrect words, spellings, and punctuation that were not noted in checking the note before saving.

## 2023-04-24 NOTE — ED Nurses Note (Signed)
Patient received to room, placed on monitor including BP, O2 & cardiac. Patient has c/o weakness, constipation x 2-3 weeks. Patient has had some med changes 3 weeks ago per daughter. Yeast like rash present on scrotal area. Denies nausea/pain at this time. Assessment and history completed. Call light in reach, encouraged to call for any needs. Awaiting providers orders at this time. Family at bedside.

## 2023-04-25 ENCOUNTER — Encounter (HOSPITAL_COMMUNITY)
Admission: EM | Disposition: A | Discharge status: Skilled Nursing Facility | Payer: Self-pay | Source: Home / Self Care | Attending: Emergency Medicine

## 2023-04-25 ENCOUNTER — Inpatient Hospital Stay (HOSPITAL_COMMUNITY): Payer: Commercial Managed Care - PPO

## 2023-04-25 DIAGNOSIS — F3132 Bipolar disorder, current episode depressed, moderate: Secondary | ICD-10-CM

## 2023-04-25 DIAGNOSIS — B372 Candidiasis of skin and nail: Secondary | ICD-10-CM

## 2023-04-25 LAB — CBC WITH DIFF
BASOPHIL #: 0.1 10*3/uL (ref 0.00–0.10)
BASOPHIL #: 0 10*3/uL (ref 0.00–0.10)
BASOPHIL %: 0 % (ref 0–1)
BASOPHIL %: 0 % (ref 0–1)
EOSINOPHIL #: 0.2 10*3/uL (ref 0.00–0.50)
EOSINOPHIL #: 0.1 10*3/uL (ref 0.00–0.50)
EOSINOPHIL %: 1 % (ref 1–7)
EOSINOPHIL %: 1 % (ref 1–7)
HCT: 45.3 % — ABNORMAL HIGH (ref 31.2–41.9)
HCT: 38.2 % (ref 31.2–41.9)
HGB: 15.4 g/dL — ABNORMAL HIGH (ref 10.9–14.3)
HGB: 13.3 g/dL (ref 10.9–14.3)
LYMPHOCYTE #: 2.5 10*3/uL (ref 1.00–3.00)
LYMPHOCYTE #: 1.4 10*3/uL (ref 1.00–3.00)
LYMPHOCYTE %: 19 % (ref 16–44)
LYMPHOCYTE %: 17 % (ref 16–44)
MCH: 33 pg — ABNORMAL HIGH (ref 24.7–32.8)
MCH: 33.5 pg — ABNORMAL HIGH (ref 24.7–32.8)
MCHC: 33.9 g/dL (ref 32.3–35.6)
MCHC: 34.8 g/dL (ref 32.3–35.6)
MCV: 97.2 fL — ABNORMAL HIGH (ref 75.5–95.3)
MCV: 96.1 fL — ABNORMAL HIGH (ref 75.5–95.3)
MONOCYTE #: 1 10*3/uL (ref 0.30–1.00)
MONOCYTE #: 0.6 10*3/uL (ref 0.30–1.00)
MONOCYTE %: 7 % (ref 5–13)
MONOCYTE %: 7 % (ref 5–13)
MPV: 9.8 fL (ref 7.9–10.8)
MPV: 9.5 fL (ref 7.9–10.8)
NEUTROPHIL #: 9.8 10*3/uL — ABNORMAL HIGH (ref 1.85–7.80)
NEUTROPHIL #: 6.3 10*3/uL (ref 1.85–7.80)
NEUTROPHIL %: 72 % (ref 43–77)
NEUTROPHIL %: 75 % (ref 43–77)
PLATELETS: 170 10*3/uL (ref 140–440)
PLATELETS: 213 10*3/uL (ref 140–440)
RBC: 4.66 10*6/uL (ref 3.63–4.92)
RBC: 3.98 10*6/uL (ref 3.63–4.92)
RDW: 13.2 % (ref 12.3–17.7)
RDW: 12.9 % (ref 12.3–17.7)
WBC: 13.5 10*3/uL — ABNORMAL HIGH (ref 3.8–11.8)
WBC: 8.5 10*3/uL (ref 3.8–11.8)

## 2023-04-25 LAB — COMPREHENSIVE METABOLIC PANEL, NON-FASTING
ALBUMIN/GLOBULIN RATIO: 1.4 (ref 0.8–1.4)
ALBUMIN: 3 g/dL — ABNORMAL LOW (ref 3.5–5.7)
ALKALINE PHOSPHATASE: 174 U/L — ABNORMAL HIGH (ref 34–104)
ALT (SGPT): 14 U/L (ref 7–52)
ANION GAP: 7 mmol/L (ref 4–13)
AST (SGOT): 16 U/L (ref 13–39)
BILIRUBIN TOTAL: 0.7 mg/dL (ref 0.3–1.0)
BUN/CREA RATIO: 16 (ref 6–22)
BUN: 25 mg/dL (ref 7–25)
CALCIUM, CORRECTED: 9.8 mg/dL (ref 8.9–10.8)
CALCIUM: 9 mg/dL (ref 8.6–10.3)
CHLORIDE: 111 mmol/L — ABNORMAL HIGH (ref 98–107)
CO2 TOTAL: 20 mmol/L — ABNORMAL LOW (ref 21–31)
CREATININE: 1.55 mg/dL — ABNORMAL HIGH (ref 0.60–1.30)
ESTIMATED GFR: 35 mL/min/{1.73_m2} — ABNORMAL LOW (ref >59)
GLOBULIN: 2.1 — ABNORMAL LOW (ref 2.9–5.4)
GLUCOSE: 91 mg/dL (ref 74–109)
OSMOLALITY, CALCULATED: 280 mOsm/kg (ref 270–290)
POTASSIUM: 4 mmol/L (ref 3.5–5.1)
PROTEIN TOTAL: 5.1 g/dL — ABNORMAL LOW (ref 6.4–8.9)
SODIUM: 138 mmol/L (ref 136–145)

## 2023-04-25 LAB — SCAN DIFFERENTIAL
PLATELET MORPHOLOGY COMMENT: NORMAL
RBC MORPHOLOGY COMMENT: NORMAL

## 2023-04-25 LAB — MAGNESIUM: MAGNESIUM: 2.5 mg/dL (ref 1.9–2.7)

## 2023-04-25 SURGERY — GASTROSCOPY WITH BIOPSY
Anesthesia: IV Sedation (Nurse Monitored) | Wound class: Contaminated Wounds-Open, fresh, accidental wounds

## 2023-04-25 MED ORDER — ARIPIPRAZOLE 10 MG TABLET
10.0000 mg | ORAL_TABLET | Freq: Once | ORAL | Status: DC
Start: 2023-04-25 — End: 2023-04-30
  Administered 2023-04-25: 0 mg via ORAL
  Filled 2023-04-25: qty 1

## 2023-04-25 MED ORDER — MIDAZOLAM 5 MG/ML INJECTION WRAPPER
2.0000 mg | INTRAMUSCULAR | Status: DC | PRN
Start: 2023-04-25 — End: 2023-04-25
  Administered 2023-04-25: 1 mg via INTRAVENOUS
  Administered 2023-04-25: 2 mg via INTRAVENOUS

## 2023-04-25 MED ORDER — ARIPIPRAZOLE 10 MG TABLET
5.0000 mg | ORAL_TABLET | Freq: Every day | ORAL | Status: AC
Start: 2023-04-26 — End: 2023-04-29
  Administered 2023-04-26 – 2023-04-29 (×4): 5 mg via ORAL
  Filled 2023-04-25 (×4): qty 1

## 2023-04-25 MED ORDER — NALOXONE 0.4 MG/ML INJECTION SOLUTION
0.4000 mg | INTRAMUSCULAR | Status: AC | PRN
Start: 2023-04-25 — End: 2023-04-25

## 2023-04-25 MED ORDER — MIDAZOLAM 5 MG/ML INJECTION WRAPPER
INTRAMUSCULAR | Status: AC
Start: 2023-04-25 — End: 2023-04-25
  Filled 2023-04-25: qty 2

## 2023-04-25 MED ORDER — LEVOFLOXACIN 250 MG/50 ML IN 5 % DEXTROSE INTRAVENOUS PIGGYBACK
250.0000 mg | INJECTION | INTRAVENOUS | Status: DC
Start: 2023-04-26 — End: 2023-04-26
  Administered 2023-04-26: 250 mg via INTRAVENOUS
  Administered 2023-04-26: 0 mg via INTRAVENOUS
  Filled 2023-04-25: qty 50

## 2023-04-25 MED ORDER — FENTANYL (PF) 50 MCG/ML INJECTION SOLUTION
INTRAMUSCULAR | Status: AC
Start: 2023-04-25 — End: 2023-04-25
  Filled 2023-04-25: qty 4

## 2023-04-25 MED ORDER — FAMOTIDINE 20 MG TABLET
40.0000 mg | ORAL_TABLET | Freq: Every evening | ORAL | Status: DC
Start: 2023-04-25 — End: 2023-04-25

## 2023-04-25 MED ORDER — PANTOPRAZOLE 40 MG TABLET,DELAYED RELEASE
40.0000 mg | DELAYED_RELEASE_TABLET | Freq: Two times a day (BID) | ORAL | Status: DC
Start: 2023-04-25 — End: 2023-04-25
  Administered 2023-04-25: 0 mg via ORAL
  Filled 2023-04-25 (×2): qty 1

## 2023-04-25 MED ORDER — BENZOCAINE 20% METERED DOSE ORAL SPRAY
ORAL | Status: AC
Start: 2023-04-25 — End: 2023-04-25
  Filled 2023-04-25: qty 60

## 2023-04-25 MED ORDER — OLANZAPINE 2.5 MG TABLET
2.5000 mg | ORAL_TABLET | Freq: Every evening | ORAL | Status: AC
Start: 2023-04-25 — End: 2023-04-29
  Administered 2023-04-25 – 2023-04-28 (×4): 2.5 mg via ORAL
  Filled 2023-04-25 (×4): qty 1

## 2023-04-25 MED ORDER — BENZOCAINE 20% METERED DOSE ORAL SPRAY
1.0000 | Freq: Once | ORAL | Status: AC
Start: 2023-04-25 — End: 2023-04-25
  Administered 2023-04-25 (×2): 1 via OROMUCOSAL

## 2023-04-25 MED ORDER — OLANZAPINE 5 MG TABLET
5.0000 mg | ORAL_TABLET | Freq: Every evening | ORAL | Status: AC
Start: 2023-04-29 — End: ?
  Administered 2023-04-29 – 2023-04-30 (×2): 5 mg via ORAL
  Filled 2023-04-25 (×2): qty 1

## 2023-04-25 MED ORDER — FENTANYL (PF) 50 MCG/ML INJECTION WRAPPER
25.0000 ug | INJECTION | INTRAMUSCULAR | Status: DC | PRN
Start: 2023-04-25 — End: 2023-04-25

## 2023-04-25 MED ORDER — SODIUM CHLORIDE 0.9 % INTRAVENOUS SOLUTION
INTRAVENOUS | Status: AC | PRN
Start: 2023-04-25 — End: 2023-04-25
  Administered 2023-04-25: 125 mL/h via INTRAVENOUS

## 2023-04-25 MED ORDER — FAMOTIDINE (PF) 20 MG/2 ML INTRAVENOUS SOLUTION
20.0000 mg | Freq: Two times a day (BID) | INTRAVENOUS | Status: DC
Start: 2023-04-25 — End: 2023-04-26
  Administered 2023-04-25 – 2023-04-26 (×2): 20 mg via INTRAVENOUS
  Filled 2023-04-25 (×2): qty 2

## 2023-04-25 MED ORDER — PANTOPRAZOLE 40 MG INTRAVENOUS SOLUTION
40.0000 mg | Freq: Two times a day (BID) | INTRAVENOUS | Status: DC
Start: 2023-04-25 — End: 2023-04-26
  Administered 2023-04-25 – 2023-04-26 (×2): 40 mg via INTRAVENOUS
  Filled 2023-04-25 (×2): qty 10

## 2023-04-25 MED ORDER — METRONIDAZOLE 500 MG/100 ML IN SODIUM CHLOR(ISO) INTRAVENOUS PIGGYBACK
500.0000 mg | INJECTION | Freq: Three times a day (TID) | INTRAVENOUS | Status: DC
Start: 2023-04-25 — End: 2023-04-26
  Administered 2023-04-25: 0 mg via INTRAVENOUS
  Administered 2023-04-25 – 2023-04-26 (×2): 500 mg via INTRAVENOUS
  Administered 2023-04-26: 0 mg via INTRAVENOUS
  Filled 2023-04-25 (×2): qty 100

## 2023-04-25 MED ORDER — FLUMAZENIL 0.1 MG/ML INTRAVENOUS SOLUTION
200.0000 ug | INTRAVENOUS | Status: AC | PRN
Start: 2023-04-25 — End: 2023-04-25

## 2023-04-25 SURGICAL SUPPLY — 3 items
DETERGENT INSTR 22OZ TRNSPT GEL RINSE FREE NEUT PH PREKLENZ CLR PLSNT LF (MISCELLANEOUS PT CARE ITEMS) ×1 IMPLANT
FORCEPS BIOPSY MICROMESH TTH STREAMLINE CATH NEEDLE 240CM 2.4MM RJ 4 SS LRG CPC STRL DISP ORNG 2.8MM (ENDOSCOPIC SUPPLIES) ×1 IMPLANT
USE ITEM 60432 FORCEPS BIOPSY MICROMESH TTH STREAMLINE CATH NEEDLE 240CM 2.4MM RJ 4 SS LRG CPC STRL DISP ORNG 2.8MM (ENDOSCOPIC SUPPLIES) ×1 IMPLANT

## 2023-04-25 NOTE — OR Surgeon (Signed)
[facility]      EGD NOTE               Patient Name: Paula Cochran  Age:  77 y.o.  Sex:  female  MRN:  [id number]  CSN:  [id number]  Date of Birth: [birth year]    Medications Given: Versed 3 mg iv by ivcs    Equipment Used: Olympus [id number]    Date: 04/25/23     Indications:   GERD, abnormal CT scan    The scope was passed into the esophagus without difficulty under direct visualization. There was moderate erythema at the distal esophagus. No esophageal varices. A small hiatal hernia was seen. The mucosa in the cardia appeared normal. The diaphragmatic impingement on retroflexion view appeared normal. The Z line appeared slightly irregular.      The mucosa in the fundus was normal. The mucosa in the body and antrum had moderate erythema. The mucosa in the duodenal bulb had multiple 0.5 cm superficial ulcers without bleeding. The mucosa past the duodenal bulb to the 2nd portion of the duodenum appeared normal. The ampulla was not visualized.   Biopsies were taken from the antrum and GEJ. Photograph(s) Taken: Yes; Blood loss: trace;     Impression:  Moderate distal esophagitis, grade 2  Superficial ulcers at the GE junction  Small hiatal hernia  Moderate gastritis   Multiple small duodenal ulcers  No ulcers or bleeding seen     Recommendations:  Begin Pantoprazole 40 mg po bid, before breakfast and before dinner  Begin Famotidine 40 mg po q hs  Await result of biopsies  Maintain antireflux precautions    [employer]ers [employer], MD

## 2023-04-25 NOTE — Care Management Notes (Signed)
[facility]  Care Management Initial Evaluation    Patient Name: Paula Cochran  Date of Birth: 1946-01-16  Sex: female  Date/Time of Admission: 04/24/2023 12:57 PM  Room/Bed: 315/A  Payor: HU[employer]NA MEDICARE / Plan: Francine Graven CHOICE PPO / Product Type: PPO /   Primary Care Providers:  Emilia Beck, DO, DO (General)    Pharmacy Info:   Preferred Pharmacy       [facility] [id number] Rosita Fire, [place] - [address] AT [facility] [address]    [address] [place] [address]    Phone: [phone] Fax: [phone]    Hours: Not open 24 hours          Emergency Contact Info:   Extended Emergency Contact Information  Primary Emergency Contact: Rayel Schirtzinger  Home Phone: [phone]  Relation: Husband  Preferred language: English  Interpreter needed? No  Secondary Emergency Contact: Gean Birchwood  Mobile Phone: [phone]  Relation: Daughter  Interpreter needed? No    History:   REBEKA LEVON is a 77 y.o., female, admitted to [facility] on 04/24/23    Height/Weight: 157.5 cm (5\' 2" ) / 82.5 kg (181 lb 14.1 oz)     LOS: 1 day   Admitting Diagnosis: Weakness generalized [R53.1]    Assessment:      04/25/23 1238   Assessment Details   Assessment Type Admission   Date of Care Management Update 04/25/23   Readmission   Is this a readmission? No   Insurance Information/Type   Insurance type Medicare   Employment/Financial   Patient has Prescription Coverage?  Yes        Name of Insurance Coverage for Medications [employer]   Financial Concerns none   Living Environment   Select an age group to open "lives with" row.  Adult   Lives With spouse   Living Arrangements house   Able to Return to Prior Arrangements no   Home Safety   Home Assessment: No Problems Identified   Home Accessibility no concerns   Custody and Legal Status   Do you have a court appointed guardian/conservator? No   Are you an emancipated minor? No   Custody Issues? No   Paternity Affidavit Requested? No   Care Management  Plan   Discharge Planning Status initial meeting   Discharge plan discussed with: Patient   CM will evaluate for rehabilitation potential yes   Patient choice offered to patient/family yes   Discharge Needs Assessment   Outpatient/Agency/Support Group Needs skilled nursing facility   Equipment Currently Used at Home raised toilet;shower chair;other (see comments)  (rollator)   Equipment Needed After Discharge none   Discharge Facility/Level of Care Needs SNF Placement (Medicare certified)(code 3)   Transportation Available ambulance   Referral Information   Admission Type inpatient   Arrived From home or self-care         Discharge Plan:  SNF Placement (Medicare certified) (code 3)  CM met with patient for initial assessment of discharge needs. Patient lives with spouse in a home with all necessary utilities and feels safe returning. Denies barriers in getting to doctor appointments and picking up/affording medications. Denies DME or HH prior to admission. Awaiting PT and OT eval however patient is agreeable to inpatient rehab at a local SNF. Referrals placed to local SNF and PS to be completed.     The patient will continue to be evaluated for developing discharge needs.     Case Manager: Delaney Meigs  Natale Milch, RN  Phone: [phone]

## 2023-04-25 NOTE — OT Evaluation (Signed)
[facility]  [address]  [place], [address]  [phone]  (Fax) [phone]  Rehabilitation Services  Occupational Therapy Inpatient Initial Evaluation      Patient Name: Paula Cochran  Date of Birth: 1946-02-13  Height: Height: 157.5 cm (5\' 2" )  Weight: Weight: 82.5 kg (181 lb 14.1 oz)  Room/Bed: 315/A  Payor: HU[employer]NA MEDICARE / Plan: [employer] CHOICE PPO / Product Type: PPO /         PMH:   Past Medical History:   Diagnosis Date    Arthropathy     Asthma     Bipolar disorder, unspecified (CMS HCC)     Cancer (CMS HCC)     squamous cell    CKD (chronic kidney disease)     Wears glasses            Assessment:      Functional Level at Time of Session: (P) Patient is a pleasant 77 year old female admitted for generalized weakness. Prior to admission, she was independent in ADLs and IADLs up until ~3 weeks ago. She used a rollator for functional mobility. During OT evaluation, patient was alert, oriented x4, cooperative and able to follow multistep commands. She has full UB AROM and fair plus (3+/5) UB strength. She was able to engage in transfers and bed mobility with min A, and required max to engage in dressing due to weakness. Patient would benefit from post-acute rehab upon discahrge. OT to follow up during acute stay for ADL training, conditioning and functional transfer training.    Discharge Needs:   Equipment Recommendation:  TBD    The patient presents with mobility limitations due to impaired balance, impaired strength, and impaired functional activity tolerance that significantly impair/prevent patient's ability to participate in mobility-related activities of daily living (MRADLs) including  ambulation and transfers in order to safely complete, toileting, bathing, safely entering/exiting the home, in reasonable time. This functional mobility deficit can be sufficiently resolved with the use of a Anticipated Equipment Needs at Discharge: (P) to be determined  in  order to decrease the risk of falls, morbidity, and mortality in performance of these MRADLs.  Patient is able to safely use this assistive device.    Discharge Disposition:  inpatient rehabilitation facility, skilled nursing facility    JUSTIFICATION OF DISCHARGE RECOMMENDATION   Based on current diagnosis, functional performance prior to admission, and current functional performance, this patient requires continued OT services in Anticipated Discharge Disposition: (P) inpatient rehabilitation facility, skilled nursing facility  in order to achieve significant functional improvements.    Plan:   Current Intervention:  Predicted Duration of Therapy: (P) until discharge    To provide Occupational therapy services  Therapy Frequency: (P) minimum of 1x/week for duration of Predicted Duration of Therapy: (P) until discharge  .       The risks/benefits of therapy have been discussed with the patient/caregiver and he/she is in agreement with the established plan of care.       Subjective & Objective     MEDICAL HISTORY:   Past Medical History:   Diagnosis Date    Arthropathy     Asthma     Bipolar disorder, unspecified (CMS HCC)     Cancer (CMS HCC)     squamous cell    CKD (chronic kidney disease)     Wears glasses          SURGICAL HISTORY:   Past Surgical History:   Procedure Laterality Date  HX APPENDECTOMY      HX DILATION AND CURETTAGE      HX HYSTERECTOMY         ipp    INSERT FLOW SHEET     04/25/23 1216   Rehab Session   Document Type evaluation   OT Visit Date 04/25/23   Total OT Minutes: 18   Patient Effort good   General Information   Patient Profile Reviewed yes   Medical Lines PIV Line   Respiratory Status room air   Pre Treatment Status   Pre Treatment Patient Status Patient supine in bed;Call light within reach;Telephone within reach;Patient safety alarm activated;Nurse approved session   Support Present Pre Treatment  Family present   Communication Pre Treatment  Charge Nurse   Communication Pre  Treatment Comment Cleared for OT   Living Environment   Lives With spouse   Living Arrangements house   Home Accessibility stairs to enter home   Living Environment Comment She has a walk-in shower, built-in bench and grab bars installe din bathroom.   Home Main Entrance   Number of Stairs, Main Entrance one   Functional Level Prior   Ambulation 1 - assistive equipment  (Patient hasnt walked in ~3 weeks. Prior to that, she would use a rolaltor.)   Transferring 0 - independent   Toileting 0 - independent   Bathing 0 - independent   Dressing 0 - independent   Eating 0 - independent   Communication 0 - understands/communicates without difficulty   Swallowing 0-->swallows foods/liquids without difficulty   Prior Functional Level Comment Patient was fully independent up until ~3 weeks ago when she started requiring assistance with self-care activities.   Self-Care   Dominant Hand right   Vital Signs   Pre-Treatment Heart Rate (beats/min) 98   Post-treatment Heart Rate (beats/min) 99   Pre SpO2 (%) 93   O2 Delivery Pre Treatment room air   Post SpO2 (%) 94   O2 Delivery Post Treatment room air   Coping/Psychosocial   Observed Emotional State calm;cooperative   Verbalized Emotional State acceptance   Family/Support System   Family/Support Persons daughter   Involvement in Care at bedside;attentive to patient;interacting with patient;participating in care   Coping/Psychosocial Response Interventions   Plan Of Care Reviewed With patient;daughter   Cognitive Assessment/Interventions   Behavior/Mood Observations behavior appropriate to situation, WNL/WFL   Orientation Status oriented x 4   Attention WNL/WFL   Follows Commands [facility]   Vision Assessment/Interventions   Visual Impairment/Limitations WFL with corrective lenses   RUE Assessment   RUE Assessment WFL for age   LUE Assessment   LUE Assessment WFL for Age   Grip Strength   Grip Left (3/5) fair, left   Right Grip (3/5) fair, right   Bed Mobility Assessment/Treatment    Supine-Sit Independence minimum assist (75% patient effort)   Sit to Supine, Independence minimum assist (75% patient effort)   Impairments strength decreased   Transfer Assessment/Treatment   Sit-Stand Independence minimum assist (75% patient effort)   Stand-Sit Independence minimum assist (75% patient effort)   Sit-Stand-Sit, Assist Device handheld assist   Lower Body Dressing Assessment/Training   Position sitting   DRESSING ASSESSED Don Socks;Doff Socks   Independence Level  moderate assist (50% patient effort);maximum assist (25% patient effort)   Impairments activity tolerance impaired;flexibility decreased;ROM decreased;strength decreased   Post Treatment Status   Post Treatment Patient Status Patient supine in bed;Call light within reach;Telephone within reach;Patient safety alarm activated   Support Present Post  Treatment  Family present   Care Plan Goals   OT Rehab Goals Bathing Goal;Grooming Goal;LB Dressing Goal;Toileting Goal;UB Dressing Goal   Bathing Goal   Bathing Goal, Date Established 04/25/23   Bathing Goal, Time to Achieve by discharge   Bathing Goal, Activity Type all bathing tasks   Bathing Goal, Independence Level minimum assist (75% patient effort)   Bathing Goal, Adaptive Equipment shower chair   Grooming Goal   Grooming Goal, Date Established 04/25/23   Grooming Goal, Time to Achieve by discharge   Grooming Goal, Activity Type all grooming tasks   Grooming Goal, Independence  contact guard assist   LB Dressing Goal   LB Dressing Goal, Date Established 04/25/23   LB Dressing Goal, Time to Achieve by discharge   LB Dressing Goal, Activity Type all lower body dressing tasks   LB Dressing Goal, Independence Level minimum assist (75% patient effort)   Toileting Goal   Toileting Goal, Date Established 04/25/23   Toileting Goal, Time to Achieve by discharge   Toileting Goal, Activity Type all toileting tasks   Toileting Goal, Independence Level minimum assist (75% patient effort)   UB Dressing  Goal   UB Dressing  Goal, Date Established 04/25/23   UB Dressing Goal, Time to Achieve by discharge   UB Dressing Goal, Activity Type all upper body dressing tasks   UB Dressing Goal, Independence Level contact guard assist   Planned Therapy Interventions, OT Eval   Planned Therapy Interventions ADL retraining;ROM (range of motion);strengthening   Clinical Impression   Functional Level at Time of Session Patient is a pleasant 77 year old female admitted for generalized weakness. Prior to admission, she was independent in ADLs and IADLs up until ~3 weeks ago. She used a rollator for functional mobility. During OT evaluation, patient was alert, oriented x4, cooperative and able to follow multistep commands. She has full UB AROM and fair plus (3+/5) UB strength. She was able to engage in transfers and bed mobility with min A, and required max to engage in dressing due to weakness. Patient would benefit from post-acute rehab upon discahrge. OT to follow up during acute stay for ADL training, conditioning and functional transfer training.   Criteria for Skilled Therapeutic Interventions Met (OT) yes;meets criteria;skilled treatment is necessary   Rehab Potential good   Therapy Frequency minimum of 1x/week   Predicted Duration of Therapy until discharge   Anticipated Equipment Needs at Discharge to be determined   Anticipated Discharge Disposition inpatient rehabilitation facility;skilled nursing facility   Evaluation Complexity Justification   Occupational Profile Review Brief history   Performance Deficits 1-3 deficits   Clinical Decision Making Low analytic complexity   Evaluation Complexity Low       TREATMENT PLAN: THERAPEUTIC ACTIVITIES and ADL/IADL TRAINING  EVALUATION COMPLEXITY: CLINICAL DECISION MAKING OF LOW COMPLEXITY AS INDICATED BY PMH, OCCUPATIONAL THERAPY ASSESSMENT OF MUSCULOSKELETAL AND NEUROLOGICAL SYSTEMS AND ACTIVITY LIMITATIONS. CLINICAL PRESENTATION IS STABLE AND UNCOMPLICATED.      EVALUATION 18  minutes    Therapist:      Hildred Laser, OT,04/25/2023 13:38

## 2023-04-25 NOTE — Nurses Notes (Signed)
Patient vomiting again after attempting to eat again. Provider notified. PRN Zofran not due until 8pm.

## 2023-04-25 NOTE — Nurses Notes (Signed)
Patient complains of nausea after trying to eat and take medications. PRN Zofran given.

## 2023-04-25 NOTE — Consults (Signed)
[facility]    Psychiatric Consultation    Patient's Full Name: Paula Cochran   Patient's Date of Birth: 12-13-1945   Patient's Age: 77 y.o.   Patient's Legal Sex: female   Patient's MRN: [id number]   Patient's Date of Admission: 04/24/2023   Current Date: 04/25/2023 11:17     Patient's Room/Bed: 315/A       Chief Complaint: Psychiatric consultation requested by Dr. Lalla Brothers for weakness and falls after psychiatric medication changes      History of Present Illness:    Per Hospitalist H&P by Herbert Seta Redden, FNP-BC, on 04/24/2023:  Paula Cochran is a 77 y.o., White female who presents with falls and weakness.  Patient reports that for the past few weeks she has had increased weakness and multiple falls at home.  States that she was ambulating with a walker prior to that at home.  She lives with her husband.  She reports decreased appetite.  Reports constipation, states that she does not think she has had a bowel movement in over a week.  She denies abdominal pain.  CT of the abdomen and pelvis was performed, showed cholelithiasis with gallbladder distention.  No surrounding inflammatory changes.  Walls thickened in the distal esophagus.  Cirrhotic morphology of the liver.  Patient reports that she had some psych medications changed for her bipolar approximately 3 weeks ago and feels like her symptoms seemed to start around that time.  Patient was seen by General surgery in the emergency department.  He did evaluate and reported the patient does not need cholecystectomy at this time.  Recommended GI consult.  Patient also had some yeast in the groin area.  She is not diabetic.  WBCs 13.5.  Creatinine 1.79, GFR 29.  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  Patient was given IV fluids, IV Levaquin and Flagyl in the emergency department.  She was given fleets enema as well.     Patient is seen on medical unit via telehealth. She is observed awake and resting in bed upon  approach by nursing staff. Patient is soft spoken but alert and oriented. 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 Patient states she remained on lithium until earlier this year when she was found to have stage 4 kidney disease. Patient is currently prescribed lamotrigine and Abilify. She states she manages her medications independently at home and has been compliant with reported regimen. Patient reports generalized weakness and worsening depression symptoms overt the last 3 weeks. Patient states she is unsure if Abilify was recent increased of if she was started at 10 mg initially. She reports low motivation and low energy with recent loss of interest. She reports intermittent passive SI, which she states last occurred approximately one month ago. She denies plan or intent with these thoughts. She states she experiences passive SI every 6 months with thoughts lasting for 2-3 days. She denies SH/HI. She denies A/V hallucinations and does not appear to actively attend to internal stimuli during exam. She denies recent symptoms consistent with mania/hypomania. She endorses recent difficulties with sleep which she attributes to a decrease in her PM lorazepam dose from 2 mg to 1.5 mg HS. She reports significant daytime fatigue with disruption in sleep cycle. Daughter is at bedside for later portion of exam and confirms patient report of recent depression symptoms.     Psychiatric History:    Past Psychiatric Diagnoses: Bipolar disorder  Treatment History:   History of Inpatient Psychiatric Treatment? Reports 3 past inpatient stays; last ten years ago  History of Outpatient Psychiatric Treatment? Yes; Consistent outpatient care; last seen approximately one month ago   Past Psychiatric Medication Trials? Lithium, Cymbalta    Substance Abuse History:    Alcohol Use: Denies   Withdrawal Hx: Denies    Drug Use: Denies     Medications and  Allergies:    Allergies   Allergen Reactions    Amoxicillin Anaphylaxis    Adhesive Rash, Itching and Hives/ Urticaria    Nickel Rash and Itching        Current Outpatient Medications   Medication Instructions    albuterol sulfate (PROVENTIL OR VENTOLIN OR PROAIR) 90 mcg/actuation Inhalation oral inhaler 1-2 Puffs, Inhalation, EVERY 6 HOURS PRN    allopurinoL (ZYLOPRIM) 100 mg, Oral, DAILY    ARIPiprazole (ABILIFY) 10 mg, DAILY    Biotin 1 mg Oral Tablet Oral    cyanocobalamin (VITAMIN B12) 1,000 mcg/mL Injection Solution INJECT 1 MILLILITER INTO THE MUSCLE ONCE EVERY MONTH    ergocalciferol (vitamin D2) (DRISDOL) 50,000 Units, Oral, EVERY 7 DAYS    furosemide (LASIX) 40 mg Oral Tablet Take 0.5 Tablets (20 mg total) by mouth Once a day    lamoTRIgine (LAMICTAL) 25 mg Oral Tablet 8 Tablets (200 mg total) Once a day    LORazepam (ATIVAN) 1.5 mg, Oral, NIGHTLY    LORazepam (ATIVAN) 0.5 mg, DAILY PRN       Social History:    Living Situation: in a house with husband    Marital Status: Married x 14 year    Children: 2; ages 33 and 38    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  Procedure                               Abnormality         Status                     ---------                               -----------         ------                     CBC WITH ZOXW[[id number]]                Abnormal            Edited Result - FINAL        Please view results for these tests on the individual orders.   COMPREHENSIVE METABOLIC  PANEL,  NON-FASTING   Result Value Ref Range    SODIUM 137 136 - 145 mmol/L    POTASSIUM 4.7 3.5 - 5.1 mmol/L    CHLORIDE 105 98 - 107 mmol/L    CO2 TOTAL 19 (L) 21 - 31 mmol/L    ANION GAP 13 4 - 13 mmol/L    BUN 25 7 - 25 mg/dL    CREATININE 1.61 (H) 0.60 - 1.30 mg/dL    BUN/CREA RATIO 14 6 - 22    ESTIMATED GFR 29 (L) >59 mL/min/1.83m^2    ALBUMIN 3.6 3.5 - 5.7 g/dL    CALCIUM 9.6 8.6 - 09.6 mg/dL    GLUCOSE 045 (H) 74 - 109 mg/dL    ALKALINE PHOSPHATASE 214 (H) 34 - 104 U/L    ALT (SGPT) 17 7 - 52 U/L    AST (SGOT) 27 13 - 39 U/L    BILIRUBIN TOTAL 0.9 0.3 - 1.0 mg/dL    PROTEIN TOTAL 5.9 (L) 6.4 - 8.9 g/dL    ALBUMIN/GLOBULIN RATIO 1.6 (H) 0.8 - 1.4    OSMOLALITY, CALCULATED 279 270 - 290 mOsm/kg    CALCIUM, CORRECTED 9.9 8.9 - 10.8 mg/dL    GLOBULIN 2.3 (L) 2.9 - 5.4    Narrative    Estimated Glomerular Filtration Rate (eGFR) is calculated using the CKD-EPI (2021) equation, intended for patients 70 years of age and older. If gender is not documented or "unknown", there will be no eGFR calculation.     ETHANOL, SERUM/PLASMA   Result Value Ref Range    ETHANOL <10 0 mg/dL   URINALYSIS, MACROSCOPIC AND MICROSCOPIC W/CULTURE REFLEX    Specimen: Urine, Clean Catch    Narrative    The following orders were created for panel order URINALYSIS, MACROSCOPIC AND MICROSCOPIC W/CULTURE REFLEX.  Procedure                               Abnormality         Status                     ---------                               -----------         ------                     URINALYSIS, MACROSCOPIC[[id number]]      Normal              Final result               URINALYSIS, MICROSCOPIC[[id number]]      Abnormal            Final result                 Please view results for these tests on the individual orders.   CBC WITH DIFF   Result Value Ref Range    WBC 13.5 (H) 3.8 - 11.8 x10^3/uL    RBC 4.66 3.63 - 4.92 x10^6/uL    HGB 15.4 (H) 10.9 - 14.3 g/dL    HCT 40.9 (H) 81.1 - 41.9 %    MCV 97.2 (H) 75.5 - 95.3 fL    MCH 33.0 (H) 24.7 - 32.8 pg     MCHC 33.9 32.3 - 35.6 g/dL    RDW  13.2 12.3 - 17.7 %    PLATELETS 170 140 - 440 x10^3/uL    MPV 9.8 7.9 - 10.8 fL    NEUTROPHIL % 72 43 - 77 %    LYMPHOCYTE % 19 16 - 44 %    MONOCYTE % 7 5 - 13 %    EOSINOPHIL % 1 1 - 7 %    BASOPHIL % 0 0 - 1 %    NEUTROPHIL # 9.80 (H) 1.85 - 7.80 x10^3/uL    LYMPHOCYTE # 2.50 1.00 - 3.00 x10^3/uL    MONOCYTE # 1.00 0.30 - 1.00 x10^3/uL    EOSINOPHIL # 0.20 0.00 - 0.50 x10^3/uL    BASOPHIL # 0.10 0.00 - 0.10 x10^3/uL   URINALYSIS, MACROSCOPIC   Result Value Ref Range    COLOR Yellow Colorless, Light Yellow, Yellow    APPEARANCE Clear Clear    SPECIFIC GRAVITY 1.005 1.002 - 1.030    PH 5.5 5.0 - 9.0    LEUKOCYTES Negative Negative, 100  WBCs/uL    NITRITE Negative Negative    PROTEIN Negative Negative, 10 , 20  mg/dL    GLUCOSE Negative Negative, 30  mg/dL    KETONES Negative Negative, Trace mg/dL    BILIRUBIN Negative Negative, 0.5 mg/dL    BLOOD Negative Negative, 0.03 mg/dL    UROBILINOGEN Normal Normal mg/dL   URINALYSIS, MICROSCOPIC   Result Value Ref Range    BACTERIA Negative Negative /hpf    MUCOUS Rare Rare, Occasional, Few /hpf    BUDDING YEAST Rare (A) (none) /hpf    RBCS <1 <4 /hpf    WBCS <1 <6 /hpf    SQUAMOUS EPITHELIAL <1 <28 /hpf   LIPASE   Result Value Ref Range    LIPASE 8 (L) 11 - 82 U/L   SCAN DIFFERENTIAL   Result Value Ref Range    RBC MORPHOLOGY COMMENT Normal     PLATELET MORPHOLOGY COMMENT Normal    LACTIC ACID LEVEL W/ REFLEX FOR LEVEL >2.0   Result Value Ref Range    LACTIC ACID 1.0 0.5 - 2.2 mmol/L   ARTERIAL BLOOD GAS/LACTATE   Result Value Ref Range    PH (ARTERIAL) 7.41 7.35 - 7.45    PCO2 (ARTERIAL) 34 (L) 35 - 45 mm/Hg    BICARBONATE (ARTERIAL) 22.0 20.0 - 26.0 mmol/L    MET-HEMOGLOBIN 0.1 <=2.0 %    LACTATE 0.8 <=2.0 mmol/L    CARBOXYHEMOGLOBIN 1.3 <=1.5 %    O2CT 20.4 %    %FIO2 (ARTERIAL) 21 %    PO2 (ARTERIAL) 73 (L) 80 - 100 mm/Hg    OXYHEMOGLOBIN 94.3 88.0 - 100.0 %    ALLEN TEST yes     DRAW SITE right radial     BASE DEFICIT 3.8 (H) 0.0  - 2.0 mmol/L    Narrative    .  .  .  .  .  Marland Kitchen   AMMONIA   Result Value Ref Range    AMMONIA 20 16 - 53 umol/L   CBC/DIFF *Canceled*    Narrative    The following orders were created for panel order CBC/DIFF.  Procedure                               Abnormality         Status                     ---------                               -----------         ------  Please view results for these tests on the individual orders.   CBC/DIFF    Narrative    The following orders were created for panel order CBC/DIFF.  Procedure                               Abnormality         Status                     ---------                               -----------         ------                     CBC WITH ZOXW[[id number]]                Abnormal            Final result                 Please view results for these tests on the individual orders.   COMPREHENSIVE METABOLIC PANEL, NON-FASTING   Result Value Ref Range    SODIUM 138 136 - 145 mmol/L    POTASSIUM 4.0 3.5 - 5.1 mmol/L    CHLORIDE 111 (H) 98 - 107 mmol/L    CO2 TOTAL 20 (L) 21 - 31 mmol/L    ANION GAP 7 4 - 13 mmol/L    BUN 25 7 - 25 mg/dL    CREATININE 1.19 (H) 0.60 - 1.30 mg/dL    BUN/CREA RATIO 16 6 - 22    ESTIMATED GFR 35 (L) >59 mL/min/1.38m^2    ALBUMIN 3.0 (L) 3.5 - 5.7 g/dL    CALCIUM 9.0 8.6 - 14.7 mg/dL    GLUCOSE 91 74 - 829 mg/dL    ALKALINE PHOSPHATASE 174 (H) 34 - 104 U/L    ALT (SGPT) 14 7 - 52 U/L    AST (SGOT) 16 13 - 39 U/L    BILIRUBIN TOTAL 0.7 0.3 - 1.0 mg/dL    PROTEIN TOTAL 5.1 (L) 6.4 - 8.9 g/dL    ALBUMIN/GLOBULIN RATIO 1.4 0.8 - 1.4    OSMOLALITY, CALCULATED 280 270 - 290 mOsm/kg    CALCIUM, CORRECTED 9.8 8.9 - 10.8 mg/dL    GLOBULIN 2.1 (L) 2.9 - 5.4    Narrative    Estimated Glomerular Filtration Rate (eGFR) is calculated using the CKD-EPI (2021) equation, intended for patients 49 years of age and older. If gender is not documented or "unknown", there will be no eGFR calculation.     MAGNESIUM   Result Value Ref Range     MAGNESIUM 2.5 1.9 - 2.7 mg/dL   CBC WITH DIFF   Result Value Ref Range    WBC 8.5 3.8 - 11.8 x10^3/uL    RBC 3.98 3.63 - 4.92 x10^6/uL    HGB 13.3 10.9 - 14.3 g/dL    HCT 56.2 13.0 - 86.5 %    MCV 96.1 (H) 75.5 - 95.3 fL    MCH 33.5 (H) 24.7 - 32.8 pg    MCHC 34.8 32.3 - 35.6 g/dL    RDW 78.4 69.6 - 29.5 %    PLATELETS 213 140 - 440 x10^3/uL    MPV 9.5 7.9 - 10.8 fL    NEUTROPHIL % 75 43 - 77 %  LYMPHOCYTE % 17 16 - 44 %    MONOCYTE % 7 5 - 13 %    EOSINOPHIL % 1 1 - 7 %    BASOPHIL % 0 0 - 1 %    NEUTROPHIL # 6.30 1.85 - 7.80 x10^3/uL    LYMPHOCYTE # 1.40 1.00 - 3.00 x10^3/uL    MONOCYTE # 0.60 0.30 - 1.00 x10^3/uL    EOSINOPHIL # 0.10 0.00 - 0.50 x10^3/uL    BASOPHIL # 0.00 0.00 - 0.10 x10^3/uL        Physical Exam:     Please see dictated hospitalist note    Current Facility-Administered Medications   Medication Dose Route Frequency    acetaminophen (TYLENOL) tablet  650 mg Oral Q4H PRN    albuterol 90 mcg per inhalation oral inhaler - "Respiratory to administer"  1 Puff Inhalation Q6H PRN    allopurinol (ZYLOPRIM) tablet  100 mg Oral Daily    ARIPiprazole (ABILIFY) tablet  10 mg Oral Daily    docusate sodium (COLACE) capsule  200 mg Oral Daily    famotidine (PEPCID) tablet  40 mg Oral NIGHTLY    fentaNYL (SUBLIMAZE) 50 mcg/mL injection  25 mcg Intravenous Q2 MIN PRN    flumazeniL (ROMAZICON) injection 0.2 mg  200 mcg Intravenous Q5 Min PRN    heparin 5,000 unit/mL injection  5,000 Units Subcutaneous Q8HRS    lamoTRIgine (LaMICtal) tablet  200 mg Oral Daily    levoFLOXacin (LEVAQUIN) 500 mg in D5W 100 mL premix IVPB  500 mg Intravenous Q24H    LORazepam (ATIVAN) tablet 1.5 mg  1.5 mg Oral NIGHTLY    metroNIDAZOLE (FLAGYL) 500 mg in NS 100 mL premix IVPB  500 mg Intravenous Q12H    midazolam (VERSED) 5 mg/mL injection  2 mg Intravenous Q2 MIN PRN    naloxone (NARCAN) 0.4 mg/mL injection  0.4 mg Intravenous Q5 Min PRN    NS flush syringe  3 mL Intracatheter Q8HRS    NS flush syringe  3 mL Intracatheter Q1H PRN     NS flush syringe  3 mL Intracatheter Q8HRS    NS flush syringe  3 mL Intracatheter Q1H PRN    NS premix infusion   Intravenous Continuous    nystatin (NYSTOP) 100,000 units/g topical powder   Apply Topically 2x/day    ondansetron (ZOFRAN) 2 mg/mL injection  4 mg Intravenous Q6H PRN    pantoprazole (PROTONIX) delayed release tablet  40 mg Oral 2x/day AC        Mental Status Examination:    Sensorium/Alertness: Alert  Orientation: Person, Place, Month, Year, Situation  Appearance: Appears stated age, Well-groomed, Overweight/obese  Psychomotor Activity: Slowed  Abnormal Behaviors: None noted  Attitude Towards Examiner: Cooperative  Eye Contact: Fair  Speech: Spontaneous, Normal rhythm, Normal volume, Soft  Mood: "depressed"  Affect: Mood-congruent   Perception: WNL, Does not appear to actively attend to internal stimuli during exam  Though Process: Linear  Thought Content: Suicidal? Denies current; Reports previous SI without plan or intent (see above)  Thought Content: Homicidal? Denies  Thought Content: Delusions? None noted  Impulse Control: WNL  Concentration/Calculation/Attention Span: Fair  How was the patient's Concentration/Calculation/Attention tested/assessed? Per observation and interview with patient   Recent Memory: Fair  Remote Memory: Fair  How was the patient's Remote Memory Tested/Assessed? Past Events, as it relates to history  Intelligence/Fund of Knowledge: Average  How was the patient's Intelligence/Fund of Knowledge Tested/Assessed? Based on history, Based on vocabulary, syntax, grammar, and content  Judgement: Fair  How was the patient's Judgement Tested/Assessed? Per patient's behavior/history of present illness  Insight: Fair  How was the patient's Insight Tested/Assessed? Understanding of severity of illness/history of present illness        Discussion with family/guardian/significant other: Daughter at bedside for exam    Clinical Conclusion:    Overall Impression:    Patient is a 77 year  old caucasian female with past psychiatric history of bipolar disorder, which patient states was well controlled with lithium until underlying kidney disease necessitated a change in medications. She reports side effects that appear consistent with Abilify. As patient denies notable improvements in mood with this medication, cross-taper to an alternative antipsychotic is appropriate at this time.     Diagnoses:   Bipolar disorder, depressed, moderate    Treatment and Medication Recommendations:  -Decrease Abilify to 5 mg daily x 4 days then stop  -Start Olanzapine at 2.5 mg HS x 4 days then increase to 5 mg hs  -Continue lamotrigine at 200 mg daily  -Obtain serum Lamotrigine level; Recommend reducing daily lamotrigine dose if elevated  -Please re-consult as needed      Interaction Attestation: Clinical telemedicine services delivered using HIPAA-compliant interactive video-audio telecommunications while the patient and the rendering provider were not in the same physical location. Patient agreeable to telecommunication    TELEMEDICINE DOCUMENTATION:    Patient Location:  [facility], [address] [place], [place] [address]   Provider Location: Remote  Patient/family aware of provider location:  yes  Patient/family consent for telemedicine:  yes  Examination observed and performed by:  Kirke Corin, APRN,PMHNP-BC    Kirke Corin, APRN,PMHNP-BC    ATTENDING ATTESTATION:  The NP/PA evaluated the patient and provided care. I have reviewed the note. I agree with their findings and at this time have no amendments to the current plan.     Emi Belfast Barry Dienes, DO  Psychiatrist  Medical Director, [facility]

## 2023-04-25 NOTE — Ancillary Notes (Signed)
Medical Nutrition Therapy Assessment        SUBJECTIVE : Patient family asked I come back later patient was actively vomiting during attempted visit.  Noted GI issues reason for poor oral intake will follow up over the weekend with patient to see if she is doing better for now may need to decrease diet to clear liquids if n/v does not improve.    OBJECTIVE:     Current Diet Order/Nutrition Support:  DIET BLAND Do you want to initiate MNT Protocol? Yes; Additional modifications/limitations: GI/SOFT      Plan/Interventions : [facility] GI soft diet as tolerated if continues to vomit may need to decrease to clear liquids.  RD to follow up    Fredia Beets, RD  04/25/2023, 14:38

## 2023-04-25 NOTE — Nurses Notes (Signed)
Guss Bunde Cox with tele psych interviewed patient for evaluation. Medication changes to patient's psych med made, see MAR.

## 2023-04-25 NOTE — Nurses Notes (Addendum)
patient cannot swallow pills whole at this time. she gags every time she tries, even when mixed into food, or crushed into food. Patient's daughter said that she gags often at home on medications as well. Consulting civil engineer notified.

## 2023-04-25 NOTE — PT Evaluation (Signed)
[facility]  [address]  [place], [address]  [phone]  (Fax) [phone]  Rehabilitation Services  Physical Therapy     Patient Name: Paula Cochran  Date of Birth: 08/10/1945  Height: Height: 157.5 cm (5\' 2" )  Weight: Weight: 82.5 kg (181 lb 14.1 oz)  Room/Bed: 315/A  Payor: [employer] MEDICARE / Plan: [employer] CHOICE PPO / Product Type: PPO /     PATIENT DID NOT PARTICIPATE IN THERAPY TODAY DUE TO: INAPPROPRIATE AT THIS TIME patient's family reported patient was vomiting right before PT came to do PT evaluation. Will continue following.         Phineas Douglas, PT 04/25/2023,14:19

## 2023-04-25 NOTE — Nurses Notes (Signed)
Patient back to floor from EGD

## 2023-04-25 NOTE — Progress Notes (Signed)
[facility]  IP PROGRESS NOTE      Paula Cochran, [place]  Date of Admission:  04/24/2023  Date of Birth:  09-Aug-1945  Date of Service:  04/25/2023    Hospital Day:  LOS: 1 day       Subjective:   Pt seen and examined for f/u esophageal thickening, cholelithiasis, bipolar disorder, generalized weakness, frequent falls. She is s/p EGD this morning which revealed multiple superficial ulcers at the GE junction and multiple small duodenal ulcers. No bleeding. Bx performed. Pt is alert and oriented with flat affect. No new complaints. No major overnight events per nursing but request is made for attention to rash in groin/lower abdomen.   Vital Signs:  Temp (24hrs) Max:37.2 C (98.9 F)      Temperature: 36.5 C (97.7 F)  BP (Non-Invasive): (!) 144/87  MAP (Non-Invasive): 101 mmHG  Heart Rate: 88  Respiratory Rate: 19  SpO2: 95 %    Current Medications:  acetaminophen (TYLENOL) tablet, 650 mg, Oral, Q4H PRN  albuterol 90 mcg per inhalation oral inhaler - "Respiratory to administer", 1 Puff, Inhalation, Q6H PRN  allopurinol (ZYLOPRIM) tablet, 100 mg, Oral, Daily  ARIPiprazole (ABILIFY) tablet, 10 mg, Oral, Daily  docusate sodium (COLACE) capsule, 200 mg, Oral, Daily  famotidine (PEPCID) tablet, 40 mg, Oral, NIGHTLY  flumazeniL (ROMAZICON) injection 0.2 mg, 200 mcg, Intravenous, Q5 Min PRN  heparin 5,000 unit/mL injection, 5,000 Units, Subcutaneous, Q8HRS  lamoTRIgine (LaMICtal) tablet, 200 mg, Oral, Daily  levoFLOXacin (LEVAQUIN) 500 mg in D5W 100 mL premix IVPB, 500 mg, Intravenous, Q24H  LORazepam (ATIVAN) tablet 1.5 mg, 1.5 mg, Oral, NIGHTLY  metroNIDAZOLE (FLAGYL) 500 mg in NS 100 mL premix IVPB, 500 mg, Intravenous, Q12H  naloxone (NARCAN) 0.4 mg/mL injection, 0.4 mg, Intravenous, Q5 Min PRN  NS flush syringe, 3 mL, Intracatheter, Q8HRS  NS flush syringe, 3 mL, Intracatheter, Q1H PRN  NS flush syringe, 3 mL, Intracatheter, Q8HRS  NS flush syringe, 3 mL, Intracatheter, Q1H PRN  NS premix infusion, ,  Intravenous, Continuous  nystatin (NYSTOP) 100,000 units/g topical powder, , Apply Topically, 2x/day  ondansetron (ZOFRAN) 2 mg/mL injection, 4 mg, Intravenous, Q6H PRN  pantoprazole (PROTONIX) delayed release tablet, 40 mg, Oral, 2x/day AC        Current Orders:  Active Orders   Lab    CBC/DIFF     Frequency: 0530 - AM DRAW     Number of Occurrences: 1 Occurrences    COMPREHENSIVE METABOLIC PANEL, NON-FASTING     Frequency: 0530 - AM DRAW     Number of Occurrences: 1 Occurrences    LAMOTRIGINE, SERUM     Frequency: 0530 - AM DRAW     Number of Occurrences: 1 Occurrences    MAGNESIUM     Frequency: 0530 - AM DRAW     Number of Occurrences: 1 Occurrences   Diet    DIET BLAND Do you want to initiate MNT Protocol? Yes; Additional modifications/limitations: GI/SOFT     Frequency: All Meals     Number of Occurrences: 1 Occurrences   Nursing    ACTIVITY     Frequency: UNTIL DISCONTINUED     Number of Occurrences: Until Specified    CONTINUOUS CARDIAC MONITORING (ED USE ONLY)     Frequency: ONE TIME     Number of Occurrences: 1 Occurrences    INTAKE AND OUTPUT QSHIFT     Frequency: QSHIFT     Number of Occurrences: Until Specified    MD TO NURSE:  IF YOU ANTICIPATE DIFFICULTY OR ARE UNBLE TO OBTAIN CULTURES WITHIN 30 MIN OF THIS ORDER, PLEASE CONTACT THE ORDERING PROVIDER FOR FURTHER DIRECTION.  TIMELY ANTIBIOTIC ADMINISTRATION IS IMPERATIVE TO TREATING A PATIENT WITH SEPSIS.     Frequency: UNTIL DISCONTINUED     Number of Occurrences: Until Specified    MD TO NURSE: IF UNABLE TO READILY OBTAIN ADEQUATE IV ACCESS WITHIN 15 MINUTES OF THIS ORDER, PLEASE NOTIFY PROVIDER IMMEDIATELY FOR FURTHER DIRECTION.     Frequency: UNTIL DISCONTINUED     Number of Occurrences: Until Specified    Notify MD Vital Signs     Frequency: PRN     Number of Occurrences: Until Specified    PT IS INTERMEDIATE RISK FOR VENOUS THROMBOEMBOLISM     Frequency: CONTINUOUS     Number of Occurrences: Until Specified    PULSE OXIMETRY Q4H     Frequency:  Q4H     Number of Occurrences: Until Specified    SOAP SUDS ENEMA     Frequency: ONE TIME     Number of Occurrences: 1 Occurrences     Order Comments:         STRAIGHT CATH     Frequency: ONE TIME     Number of Occurrences: 1 Occurrences    TELEMETRY MONITORING - Continuous     Frequency: CONTINUOUS     Number of Occurrences: Until Specified    VITAL SIGNS  Q2H     Frequency: Q2H     Number of Occurrences: 250 Occurrences    VITAL SIGNS  Q4H     Frequency: Q4H     Number of Occurrences: Until Specified    WEIGH PATIENT     Frequency: UPON ADMISSION     Number of Occurrences: 1 Occurrences   Consult    IP CONSULT TO CARE MANAGEMENT     Frequency: ONE TIME     Number of Occurrences: 1 Occurrences    IP CONSULT TO GASTROENTEROLOGY On-Call Provider (nurse/clerk to determine)     Frequency: ONE TIME     Number of Occurrences: 1 Occurrences    IP CONSULT TO GENERAL SURGERY Requested Provider; DUREMDES, GENE B     Frequency: ONE TIME     Number of Occurrences: 1 Occurrences    IP CONSULT TO NUTRITION SERVICES Reason for Consult: POOR PO INTAKE     Frequency: ONE TIME     Number of Occurrences: 1 Occurrences    IP CONSULT TO PSYCHIATRY On-Call Provider (nurse/clerk to determine)     Frequency: ONE TIME     Number of Occurrences: 1 Occurrences   OT    OT EVAL & TREAT     Frequency: Per Therapist Discretion     Number of Occurrences: 1 Occurrences     Scheduling Instructions:               PT    PT EVALUATE AND TREAT     Frequency: Per Therapist Discretion     Number of Occurrences: 1 Occurrences     Order Comments: If patient's O2 level drops, PT may increase O2 until sats are > 93%.       Scheduling Instructions:               Point of Care Testing    PERFORM POC WHOLE BLOOD GLUCOSE     Frequency: ONE TIME     Number of Occurrences: 1 Occurrences   IV    INSERT & MAINTAIN PERIPHERAL IV  ACCESS     Frequency: UNTIL DISCONTINUED     Number of Occurrences: Until Specified    PERIPHERAL IV DRESSING CHANGE     Frequency: PRN      Number of Occurrences: Until Specified   Case Request    CASE REQUEST SURGICAL: GASTROSCOPY     Frequency: ONE TIME     Number of Occurrences: 1 Occurrences   Medications    acetaminophen (TYLENOL) tablet     Frequency: Q4H PRN     Dose: 650 mg     Route: Oral    albuterol 90 mcg per inhalation oral inhaler - "Respiratory to administer"     Frequency: Q6H PRN     Dose: 1 Puff     Route: Inhalation    allopurinol (ZYLOPRIM) tablet     Frequency: Daily     Dose: 100 mg     Route: Oral    ARIPiprazole (ABILIFY) tablet     Frequency: Daily     Dose: 10 mg     Route: Oral    docusate sodium (COLACE) capsule     Frequency: Daily     Dose: 200 mg     Route: Oral    famotidine (PEPCID) tablet     Frequency: NIGHTLY     Dose: 40 mg     Route: Oral    flumazeniL (ROMAZICON) injection 0.2 mg     Frequency: Q5 Min PRN     Dose: 200 mcg     Route: Intravenous    heparin 5,000 unit/mL injection     Frequency: Q8HRS     Dose: 5,000 Units     Route: Subcutaneous    lamoTRIgine (LaMICtal) tablet     Frequency: Daily     Dose: 200 mg     Route: Oral    levoFLOXacin (LEVAQUIN) 500 mg in D5W 100 mL premix IVPB     Frequency: Q24H     Dose: 500 mg     Route: Intravenous    LORazepam (ATIVAN) tablet 1.5 mg     Frequency: NIGHTLY     Dose: 1.5 mg     Route: Oral    metroNIDAZOLE (FLAGYL) 500 mg in NS 100 mL premix IVPB     Frequency: Q12H     Dose: 500 mg     Route: Intravenous    naloxone (NARCAN) 0.4 mg/mL injection     Frequency: Q5 Min PRN     Dose: 0.4 mg     Route: Intravenous    NS flush syringe     Frequency: Q8HRS     Dose: 3 mL     Route: Intracatheter    NS flush syringe     Frequency: Q1H PRN     Dose: 3 mL     Route: Intracatheter    NS flush syringe     Frequency: Q8HRS     Dose: 3 mL     Route: Intracatheter    NS flush syringe     Frequency: Q1H PRN     Dose: 3 mL     Route: Intracatheter    NS premix infusion     Frequency: Continuous     Route: Intravenous    nystatin (NYSTOP) 100,000 units/g topical powder      Frequency: 2x/day     Route: Apply Topically    ondansetron (ZOFRAN) 2 mg/mL injection     Frequency: Q6H PRN     Dose: 4 mg  Route: Intravenous    pantoprazole (PROTONIX) delayed release tablet     Frequency: 2x/day AC     Dose: 40 mg     Route: Oral        Review of Systems:  Focused review of system was completed. Refer to the HPI for ROS details.     Today's Physical Exam:  Physical Exam  Constitutional:       General: She is not in acute distress.     Appearance: She is obese.   Eyes:      Pupils: Pupils are equal, round, and reactive to light.   Cardiovascular:      Rate and Rhythm: Normal rate and regular rhythm.      Pulses: Normal pulses.   Pulmonary:      Effort: Pulmonary effort is normal. No respiratory distress.      Breath sounds: No wheezing or rales.   Abdominal:      General: Bowel sounds are normal. There is no distension.      Palpations: Abdomen is soft.      Tenderness: There is abdominal tenderness (epigastric). There is no guarding.   Musculoskeletal:      Right lower leg: No edema.      Left lower leg: No edema.   Skin:     General: Skin is warm and dry.      Findings: Erythema (cutaneous candidiasis noted to intertriginous areas of pannus, groin, and external genitalia) present.   Neurological:      General: No focal deficit present.      Mental Status: She is alert and oriented to person, place, and time.   Psychiatric:      Comments: Flat affect              I/O:  I/O last 24 hours:    Intake/Output Summary (Last 24 hours) at 04/25/2023 1158  Last data filed at 04/25/2023 1000  Gross per 24 hour   Intake 850 ml   Output 1000 ml   Net -150 ml     I/O current shift:  09/20 0700 - 09/20 1859  In: 500 [I.V.:500]  Out: -       Labs  Please indicate ordered or reviewed)  Reviewed: I have reviewed all lab results.  Lab Results Today:    Results for orders placed or performed during the hospital encounter of 04/24/23 (from the past 24 hour(s))   COMPREHENSIVE METABOLIC PANEL, NON-FASTING   Result  Value Ref Range    SODIUM 137 136 - 145 mmol/L    POTASSIUM 4.7 3.5 - 5.1 mmol/L    CHLORIDE 105 98 - 107 mmol/L    CO2 TOTAL 19 (L) 21 - 31 mmol/L    ANION GAP 13 4 - 13 mmol/L    BUN 25 7 - 25 mg/dL    CREATININE 2.72 (H) 0.60 - 1.30 mg/dL    BUN/CREA RATIO 14 6 - 22    ESTIMATED GFR 29 (L) >59 mL/min/1.30m^2    ALBUMIN 3.6 3.5 - 5.7 g/dL    CALCIUM 9.6 8.6 - 53.6 mg/dL    GLUCOSE 644 (H) 74 - 109 mg/dL    ALKALINE PHOSPHATASE 214 (H) 34 - 104 U/L    ALT (SGPT) 17 7 - 52 U/L    AST (SGOT) 27 13 - 39 U/L    BILIRUBIN TOTAL 0.9 0.3 - 1.0 mg/dL    PROTEIN TOTAL 5.9 (L) 6.4 - 8.9 g/dL    ALBUMIN/GLOBULIN RATIO 1.6 (H)  0.8 - 1.4    OSMOLALITY, CALCULATED 279 270 - 290 mOsm/kg    CALCIUM, CORRECTED 9.9 8.9 - 10.8 mg/dL    GLOBULIN 2.3 (L) 2.9 - 5.4   ETHANOL, SERUM/PLASMA   Result Value Ref Range    ETHANOL <10 0 mg/dL   LIPASE   Result Value Ref Range    LIPASE 8 (L) 11 - 82 U/L   CBC WITH DIFF   Result Value Ref Range    WBC 13.5 (H) 3.8 - 11.8 x10^3/uL    RBC 4.66 3.63 - 4.92 x10^6/uL    HGB 15.4 (H) 10.9 - 14.3 g/dL    HCT 60.4 (H) 54.0 - 41.9 %    MCV 97.2 (H) 75.5 - 95.3 fL    MCH 33.0 (H) 24.7 - 32.8 pg    MCHC 33.9 32.3 - 35.6 g/dL    RDW 98.1 19.1 - 47.8 %    PLATELETS 170 140 - 440 x10^3/uL    MPV 9.8 7.9 - 10.8 fL    NEUTROPHIL % 72 43 - 77 %    LYMPHOCYTE % 19 16 - 44 %    MONOCYTE % 7 5 - 13 %    EOSINOPHIL % 1 1 - 7 %    BASOPHIL % 0 0 - 1 %    NEUTROPHIL # 9.80 (H) 1.85 - 7.80 x10^3/uL    LYMPHOCYTE # 2.50 1.00 - 3.00 x10^3/uL    MONOCYTE # 1.00 0.30 - 1.00 x10^3/uL    EOSINOPHIL # 0.20 0.00 - 0.50 x10^3/uL    BASOPHIL # 0.10 0.00 - 0.10 x10^3/uL   SCAN DIFFERENTIAL   Result Value Ref Range    RBC MORPHOLOGY COMMENT Normal     PLATELET MORPHOLOGY COMMENT Normal    ARTERIAL BLOOD GAS/LACTATE   Result Value Ref Range    PH (ARTERIAL) 7.41 7.35 - 7.45    PCO2 (ARTERIAL) 34 (L) 35 - 45 mm/Hg    BICARBONATE (ARTERIAL) 22.0 20.0 - 26.0 mmol/L    MET-HEMOGLOBIN 0.1 <=2.0 %    LACTATE 0.8 <=2.0 mmol/L     CARBOXYHEMOGLOBIN 1.3 <=1.5 %    O2CT 20.4 %    %FIO2 (ARTERIAL) 21 %    PO2 (ARTERIAL) 73 (L) 80 - 100 mm/Hg    OXYHEMOGLOBIN 94.3 88.0 - 100.0 %    ALLEN TEST yes     DRAW SITE right radial     BASE DEFICIT 3.8 (H) 0.0 - 2.0 mmol/L   LACTIC ACID LEVEL W/ REFLEX FOR LEVEL >2.0   Result Value Ref Range    LACTIC ACID 1.0 0.5 - 2.2 mmol/L   AMMONIA   Result Value Ref Range    AMMONIA 20 16 - 53 umol/L   URINALYSIS, MACROSCOPIC   Result Value Ref Range    COLOR Yellow Colorless, Light Yellow, Yellow    APPEARANCE Clear Clear    SPECIFIC GRAVITY 1.005 1.002 - 1.030    PH 5.5 5.0 - 9.0    LEUKOCYTES Negative Negative, 100  WBCs/uL    NITRITE Negative Negative    PROTEIN Negative Negative, 10 , 20  mg/dL    GLUCOSE Negative Negative, 30  mg/dL    KETONES Negative Negative, Trace mg/dL    BILIRUBIN Negative Negative, 0.5 mg/dL    BLOOD Negative Negative, 0.03 mg/dL    UROBILINOGEN Normal Normal mg/dL   URINALYSIS, MICROSCOPIC   Result Value Ref Range    BACTERIA Negative Negative /hpf    MUCOUS Rare Rare, Occasional, Few /  hpf    BUDDING YEAST Rare (A) (none) /hpf    RBCS <1 <4 /hpf    WBCS <1 <6 /hpf    SQUAMOUS EPITHELIAL <1 <28 /hpf   COMPREHENSIVE METABOLIC PANEL, NON-FASTING   Result Value Ref Range    SODIUM 138 136 - 145 mmol/L    POTASSIUM 4.0 3.5 - 5.1 mmol/L    CHLORIDE 111 (H) 98 - 107 mmol/L    CO2 TOTAL 20 (L) 21 - 31 mmol/L    ANION GAP 7 4 - 13 mmol/L    BUN 25 7 - 25 mg/dL    CREATININE 1.61 (H) 0.60 - 1.30 mg/dL    BUN/CREA RATIO 16 6 - 22    ESTIMATED GFR 35 (L) >59 mL/min/1.83m^2    ALBUMIN 3.0 (L) 3.5 - 5.7 g/dL    CALCIUM 9.0 8.6 - 09.6 mg/dL    GLUCOSE 91 74 - 045 mg/dL    ALKALINE PHOSPHATASE 174 (H) 34 - 104 U/L    ALT (SGPT) 14 7 - 52 U/L    AST (SGOT) 16 13 - 39 U/L    BILIRUBIN TOTAL 0.7 0.3 - 1.0 mg/dL    PROTEIN TOTAL 5.1 (L) 6.4 - 8.9 g/dL    ALBUMIN/GLOBULIN RATIO 1.4 0.8 - 1.4    OSMOLALITY, CALCULATED 280 270 - 290 mOsm/kg    CALCIUM, CORRECTED 9.8 8.9 - 10.8 mg/dL    GLOBULIN 2.1 (L) 2.9 -  5.4   MAGNESIUM   Result Value Ref Range    MAGNESIUM 2.5 1.9 - 2.7 mg/dL   CBC WITH DIFF   Result Value Ref Range    WBC 8.5 3.8 - 11.8 x10^3/uL    RBC 3.98 3.63 - 4.92 x10^6/uL    HGB 13.3 10.9 - 14.3 g/dL    HCT 40.9 81.1 - 91.4 %    MCV 96.1 (H) 75.5 - 95.3 fL    MCH 33.5 (H) 24.7 - 32.8 pg    MCHC 34.8 32.3 - 35.6 g/dL    RDW 78.2 95.6 - 21.3 %    PLATELETS 213 140 - 440 x10^3/uL    MPV 9.5 7.9 - 10.8 fL    NEUTROPHIL % 75 43 - 77 %    LYMPHOCYTE % 17 16 - 44 %    MONOCYTE % 7 5 - 13 %    EOSINOPHIL % 1 1 - 7 %    BASOPHIL % 0 0 - 1 %    NEUTROPHIL # 6.30 1.85 - 7.80 x10^3/uL    LYMPHOCYTE # 1.40 1.00 - 3.00 x10^3/uL    MONOCYTE # 0.60 0.30 - 1.00 x10^3/uL    EOSINOPHIL # 0.10 0.00 - 0.50 x10^3/uL    BASOPHIL # 0.00 0.00 - 0.10 x10^3/uL           Problem List:  Active Hospital Problems   (*Primary Problem)    Diagnosis    *Weakness generalized    Constipation    Fall    Gallstones    Esophageal thickening    CKD (chronic kidney disease) stage 4, 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 No active bleeding. Bx results pending. Continue Protonix 40 mg po bid and famotidine 40 mg po qhs. Resume bland diet. F/u GI as recommended.     2)asymptomatic cholelithiasis  Pt was evaluated by general surgeon and not felt to require surgical intervention. She had no inflammatory changes surrounding the gallbladder on  noncontrast CTAP. She is asymptomatic regarding n/v. Had been empirically started on CTX and Flagyl, but do not see clear indication for abx at this time and will DC.     3)Bipolar disorder  Chronic-had been stable on lithium therapy which was DC due to CKD per pt family. Pt mood and mobility have declined and led to weakness and increased falls at home. Psych consult placed/pending.     4)cirrhosis  Detected on CTAP. Outpt GI f/u.     5)CKD stage 4    6)generalized weakness/frequent falls  PT eval pending. P likely will need inpatient rehab at DC. Case management c/s  pending.     7)cutaneous candidiasis  Continue topical antifungal powder and observe. May escalate tx if no improvement.     Further recommendations depending on clinical course/test results.     The Hospitalist personally evaluated and examined the patient in conjunction with the MLP and agree with the assessments, treatment plan and disposition of the patient as recorded by the [facility].      Laurey Morale, MSPA, [facility]  [facility]    DVT/PE Prophylaxis: Heparin

## 2023-04-26 DIAGNOSIS — Z79899 Other long term (current) drug therapy: Secondary | ICD-10-CM

## 2023-04-26 LAB — COMPREHENSIVE METABOLIC PANEL, NON-FASTING
ALBUMIN/GLOBULIN RATIO: 1.6 — ABNORMAL HIGH (ref 0.8–1.4)
ALBUMIN: 3.2 g/dL — ABNORMAL LOW (ref 3.5–5.7)
ALKALINE PHOSPHATASE: 182 U/L — ABNORMAL HIGH (ref 34–104)
ALT (SGPT): 11 U/L (ref 7–52)
ANION GAP: 8 mmol/L (ref 4–13)
AST (SGOT): 15 U/L (ref 13–39)
BILIRUBIN TOTAL: 0.4 mg/dL (ref 0.3–1.0)
BUN/CREA RATIO: 18 (ref 6–22)
BUN: 30 mg/dL — ABNORMAL HIGH (ref 7–25)
CALCIUM, CORRECTED: 10 mg/dL (ref 8.9–10.8)
CALCIUM: 9.4 mg/dL (ref 8.6–10.3)
CHLORIDE: 112 mmol/L — ABNORMAL HIGH (ref 98–107)
CO2 TOTAL: 23 mmol/L (ref 21–31)
CREATININE: 1.69 mg/dL — ABNORMAL HIGH (ref 0.60–1.30)
ESTIMATED GFR: 31 mL/min/{1.73_m2} — ABNORMAL LOW (ref >59)
GLOBULIN: 2 — ABNORMAL LOW (ref 2.9–5.4)
GLUCOSE: 111 mg/dL — ABNORMAL HIGH (ref 74–109)
OSMOLALITY, CALCULATED: 292 mOsm/kg — ABNORMAL HIGH (ref 270–290)
POTASSIUM: 4.5 mmol/L (ref 3.5–5.1)
PROTEIN TOTAL: 5.2 g/dL — ABNORMAL LOW (ref 6.4–8.9)
SODIUM: 143 mmol/L (ref 136–145)

## 2023-04-26 LAB — CBC WITH DIFF
BASOPHIL #: 0 10*3/uL (ref 0.00–0.10)
BASOPHIL %: 0 % (ref 0–1)
EOSINOPHIL #: 0.1 10*3/uL (ref 0.00–0.50)
EOSINOPHIL %: 1 % (ref 1–7)
HCT: 38.5 % (ref 31.2–41.9)
HGB: 13.1 g/dL (ref 10.9–14.3)
LYMPHOCYTE #: 1.7 10*3/uL (ref 1.00–3.00)
LYMPHOCYTE %: 20 % (ref 16–44)
MCH: 33 pg — ABNORMAL HIGH (ref 24.7–32.8)
MCHC: 34 g/dL (ref 32.3–35.6)
MCV: 97 fL — ABNORMAL HIGH (ref 75.5–95.3)
MONOCYTE #: 0.8 10*3/uL (ref 0.30–1.00)
MONOCYTE %: 9 % (ref 5–13)
MPV: 9.7 fL (ref 7.9–10.8)
NEUTROPHIL #: 5.9 10*3/uL (ref 1.85–7.80)
NEUTROPHIL %: 70 % (ref 43–77)
PLATELETS: 241 10*3/uL (ref 140–440)
RBC: 3.97 10*6/uL (ref 3.63–4.92)
RDW: 13.3 % (ref 12.3–17.7)
WBC: 8.4 10*3/uL (ref 3.8–11.8)

## 2023-04-26 LAB — MAGNESIUM: MAGNESIUM: 2.6 mg/dL (ref 1.9–2.7)

## 2023-04-26 MED ORDER — FAMOTIDINE 40 MG TABLET
40.0000 mg | ORAL_TABLET | Freq: Every evening | ORAL | Status: AC
Start: 2023-04-26 — End: ?
  Administered 2023-04-26 – 2023-04-30 (×5): 40 mg via ORAL
  Filled 2023-04-26 (×5): qty 1

## 2023-04-26 MED ORDER — PANTOPRAZOLE 40 MG TABLET,DELAYED RELEASE
40.0000 mg | DELAYED_RELEASE_TABLET | Freq: Two times a day (BID) | ORAL | Status: AC
Start: 2023-04-26 — End: ?
  Administered 2023-04-26 – 2023-04-30 (×9): 40 mg via ORAL
  Filled 2023-04-26 (×9): qty 1

## 2023-04-26 NOTE — PT Evaluation (Signed)
[facility]  [address]  [place], [address]  [phone]  (Fax) [phone]  Rehabilitation Services  Physical Therapy Inpatient Initial Evaluation    Patient Name: Paula Cochran  Date of Birth: Mar 07, 1946  Height: Height: 157.5 cm (5\' 2" )  Weight: Weight: 82.5 kg (181 lb 14.1 oz)  Room/Bed: 315/A  Payor: [employer] MEDICARE / Plan: [employer] CHOICE PPO / Product Type: PPO /       PMH:  Past Medical History:   Diagnosis Date    Arthropathy     Asthma     Bipolar disorder, unspecified (CMS HCC)     Cancer (CMS HCC)     squamous cell    CKD (chronic kidney disease)     Wears glasses            Assessment:      (P) Ms. Marmer is a 77 y.o. female admitted with generalized weakness.  Her PLOF included using a walker for ambulation and independent with ADLs, however, 3 weeks prior to admission her medication was changed which caused her to be bed bound.  She currently requires moderate assist with bed mobility and transfers, and minimal assist with 25 ft of ambulation with significant HR increase.  She would benefit from PT to address these deficits to achieve highest functional level possible during acute stay.    Discharge Needs:    Equipment Recommendation: (P) TBD      The patient presents with mobility limitations due to impaired strength and impaired functional activity tolerance that significantly impair/prevent patient's ability to participate in mobility-related activities of daily living (MRADLs) including  ambulation and transfers in order to safely complete, toileting, bathing, food preparation, laundering/household tasks, safely entering/exiting the home, in reasonable time. This functional mobility deficit can be sufficiently resolved with the use of a (P) TBD  in order to decrease the risk of falls, morbidity, and mortality in performance of these MRADLs.  Patient is able to safely use this assistive device.    Discharge Disposition: (P) skilled nursing  facility    JUSTIFICATION OF DISCHARGE RECOMMENDATION   Based on current diagnosis, functional performance prior to admission, and current functional performance, this patient requires continued PT services in (P) skilled nursing facility in order to achieve significant functional improvements in these deficit areas: (P) aerobic capacity/endurance, gait, locomotion, and balance, muscle performance.        Plan:   Current Intervention: (P) balance training, bed mobility training, gait training, strengthening, transfer training  To provide physical therapy services (P)  (1-2x a day at least one day weekly)  for duration of (P) until discharge.    The risks/benefits of therapy have been discussed with the patient/caregiver and he/she is in agreement with the established plan of care.       Subjective & Objective     Past Medical History:   Diagnosis Date    Arthropathy     Asthma     Bipolar disorder, unspecified (CMS HCC)     Cancer (CMS HCC)     squamous cell    CKD (chronic kidney disease)     Wears glasses             Past Surgical History:   Procedure Laterality Date    HX APPENDECTOMY      HX DILATION AND CURETTAGE      HX HYSTERECTOMY  04/26/23 0910   Rehab Session   Document Type evaluation   PT Visit Date 04/26/23   General Information   Patient Profile Reviewed yes   Pertinent History of Current Functional Problem Paula Cochran is a 77 y.o female who presented to the ED on 9/19 due to weakness and falls.  PMHx includes asthma, bipolar disorder, CKD, and skin cancer.  Her falls begain after a recent medication change.  She was admitted with generalized weakness.   Medical Lines PIV Line;Telemetry   Respiratory Status room air   Existing Precautions/Restrictions aspiration precautions   Mutuality/Individual Preferences   Anxieties, Fears or Concerns None stated   Individualized Care Needs Assist with all functional mobility   Patient-Specific Goals (Include Timeframe) Get stronger   Plan of  Care Reviewed With patient   Patient would like to participate in bedside shift report Yes   Living Environment   Lives With spouse   Living Arrangements house   Home Assessment: No Problems Identified   Home Accessibility bed and bath on same level;no concerns   Home Main Entrance   Number of Stairs, Main Entrance one   Functional Level Prior   Ambulation 1 - assistive equipment   Transferring 0 - independent   Toileting 0 - independent   Bathing 0 - independent   Dressing 0 - independent   Eating 0 - independent   Communication 0 - understands/communicates without difficulty   Prior Functional Level Comment Patient has not been ambulatory for approximately  3-4 weeks since a change in her medication.   Pre Treatment Status   Pre Treatment Patient Status Patient supine in bed;Call light within reach;Telephone within reach;Nurse approved session;Patient safety alarm activated   Support Present Pre Treatment  None   Communication Pre Treatment  Charge Nurse   Communication Pre Treatment Comment Cleared for PT evaluation   Cognitive Assessment/Interventions   Behavior/Mood Observations behavior appropriate to situation, WNL/WFL   Orientation Status oriented x 4   Attention WNL/WFL   Follows Commands WNL   Pre- Treatment Vital Signs   Pre-Treatment Heart Rate (beats/min) 91   Pre SpO2 (%) 94   O2 Delivery Pre Treatment room air   Pre-Treatment Pain   Pretreatment Pain Rating 0/10 - no pain   RLE Assessment   RLE Assessment X-Exceptions   RLE ROM   (50% of normal with supine heel slide)   RLE Strength 3/5 with SLR   LLE Assessment   LLE Assessment X-Exceptions   LLE ROM   (75% of normal with supine heel slide)   LLE Strength 3+/5 with SLR   Trunk Assessment   Trunk Assessment X-Exceptions   Trunk Strength 3-/5   Mobility Assessment/Training   Additional Documentation Bed Mobility Assessment/Treatment (Group);Transfer Assessment/Treatment (Group);Gait Assessment/Treatment (Group)   Bed Mobility Assessment/Treatment    Supine-Sit Independence moderate assist (50% patient effort)   Sit to Supine, Independence moderate assist (50% patient effort)   Impairments endurance;strength decreased   Bed Mobility, Assistive Device draw sheet;bed rails   Safety Issues impaired trunk control for bed mobility;decreased use of legs for bridging/pushing;decreased use of arms for pushing/pulling   Comment Required use of draw sheet to reposition in bed   Transfer Assessment/Treatment   Sit-Stand Independence moderate assist (50% patient effort)   Stand-Sit Independence moderate assist (50% patient effort)   Sit-Stand-Sit, Assist Device walker, front wheeled   Transfer Impairments endurance;motor control impaired;strength decreased;ROM decreased   Transfer Safety Issues step length decreased;sequencing ability decreased   Gait Assessment/Treatment  Total Distance Ambulated 25   Gait Speed Decreased   Impairments  strength decreased;endurance   Deviations  step length decreased;stride length decreased   Assistive Device  walker, front wheeled   Comment Patient ambulates with a crouched gait pattern with difficulty extending the bilateral knees during the stance phase of gait.   Distance in Feet 25   Independence  minimum assist (75% patient effort)   Safety Issues  step length decreased;sequencing ability decreased   Post Treatment Status   Post Treatment Patient Status Patient supine in bed;Patient safety alarm activated;Call light within reach;Telephone within reach   Support Present Post Treatment  Family present   Communication Post Treatement Nurse   Patient Effort good   Post-Treatment Vital Signs   Post-treatment Heart Rate (beats/min) 134   Post SpO2 (%) 93   O2 Delivery Post Treatment room air   Post-Treatment Pain   Posttreatment Pain Rating 0/10 - no pain   Physical Therapy Clinical Impression   Assessment Ms. Thrall is a 77 y.o. female admitted with generalized weakness.  Her PLOF included using a walker for ambulation and  independent with ADLs, however, 3 weeks prior to admission her medication was changed which caused her to be bed bound.  She currently requires moderate assist with bed mobility and transfers, and minimal assist with 25 ft of ambulation with significant HR increase.  She would benefit from PT to address these deficits to achieve highest functional level possible during acute stay.   Criteria for Skilled Therapeutic yes   Impairments Found (describe specific impairments) aerobic capacity/endurance;gait, locomotion, and balance;muscle performance   Functional Limitations in Following  self-care;home management;community/leisure   Rehab Potential fair   Therapy Frequency   (1-2x a day at least one day weekly)   Predicted Duration of Therapy Intervention (days/wks) until discharge   Anticipated Equipment Needs at Discharge (PT) TBD   Anticipated Discharge Disposition skilled nursing facility   Evaluation Complexity Justification   Patient History: Co-morbidity/factors that impact Plan of Care 3 or more that impact Plan of Care   Examination Components 4 or more Exam elements addressed   Presentation Stable: Uncomplicated, straight-forward, problem focused   Clinical Decision Making Low complexity   Evaluation Complexity Low complexity   Care Plan Goals   PT Rehab Goals Bed Mobility Goal;Transfer Training Goal;Gait Training Goal   Planned Therapy Interventions, PT Eval   Planned Therapy Interventions (PT) balance training;bed mobility training;gait training;strengthening;transfer training   Transfer Training Goal   Transfer Training Goal, Date Established 04/26/23   Transfer Training Goal, Time to Achieve by discharge   Transfer Training Goal, Activity Type sit-to-stand/stand-to-sit   Transfer Training Goal, Current Status moderate assist (50% patient effort)   Transfer Training Goal, Independence Level supervision required   Transfer Training Goal, Assist Device walker, rolling   Gait Training  Goal, Distance to Achieve    Gait Training  Goal, Date Established 04/26/23   Gait Training  Goal, Time to Achieve by discharge   Gait Training  Goal, Independence Level contact guard assist   Gait Training  Goal, Assist Device walker, rolling   Gait Training  Goal, Distance to Achieve 75   Bed Mobility Goal   Bed Mobility Goal, Date Established 04/26/23   Bed Mobility Goal, Time to Achieve by discharge   Bed Mobility Goal, Activity Type supine to sit/sit to supine   Bed Mobility Goal, Current Status moderate assist (50% patient effort)   Bed Mobility Goal, Independence Level supervision required   Bed  Mobility Goal, Assistive Device bed rails   Physical Therapy Time and Intention   Total PT Minutes: 20   (INSERT FLOWSHEET)            INTERVENTION MINUTES: EVALUATION 20 minutes    EVALUATION COMPLEXITY : CLINICAL DECISION MAKING OF LOW COMPLEXITY AS INDICATED BY PMH, PHYSICAL THERAPY ASSESSMENT OF MUSCULOSKELETAL AND NEUROLOGICAL SYSTEMS AND ACTIVITY LIMITATIONS. CLINICAL PRESENTATION IS STABLE AND UNCOMPLICATED    Therapist:     Marcell Anger, PT  04/26/2023, 12:05

## 2023-04-26 NOTE — Consults (Signed)
[facility]  Gastroenterology/ Hepatology Progress Note      Paula Cochran  Date of service: 04/26/2023    Subjective      She indicates that she is feeling better but did have some vomiting yesterday evening that was relived with Zofran. She is preparing to walk with physical therapy.    acetaminophen (TYLENOL) tablet, 650 mg, Oral, Q4H PRN  albuterol 90 mcg per inhalation oral inhaler - "Respiratory to administer", 1 Puff, Inhalation, Q6H PRN  allopurinol (ZYLOPRIM) tablet, 100 mg, Oral, Daily  ARIPiprazole (ABILIFY) tablet, 5 mg, Oral, Daily  ARIPiprazole (ABILIFY) tablet, 10 mg, Oral, Once  docusate sodium (COLACE) capsule, 200 mg, Oral, Daily  famotidine (PEPCID) 10 mg/mL injection, 20 mg, Intravenous, 2x/day  heparin 5,000 unit/mL injection, 5,000 Units, Subcutaneous, Q8HRS  lamoTRIgine (LaMICtal) tablet, 200 mg, Oral, Daily  levoFLOXacin (LEVAQUIN) 250 mg in D5W 50 mL IVPB, 250 mg, Intravenous, Q24H  LORazepam (ATIVAN) tablet 1.5 mg, 1.5 mg, Oral, NIGHTLY  metroNIDAZOLE (FLAGYL) 500 mg in NS 100 mL premix IVPB, 500 mg, Intravenous, Q8H  NS flush syringe, 3 mL, Intracatheter, Q8HRS  NS flush syringe, 3 mL, Intracatheter, Q1H PRN  NS flush syringe, 3 mL, Intracatheter, Q8HRS  NS flush syringe, 3 mL, Intracatheter, Q1H PRN  NS premix infusion, , Intravenous, Continuous  nystatin (NYSTOP) 100,000 units/g topical powder, , Apply Topically, 2x/day  OLANZapine (zyPREXA) tablet, 2.5 mg, Oral, NIGHTLY  [START ON 04/29/2023] OLANZapine (zyPREXA) tablet, 5 mg, Oral, NIGHTLY  ondansetron (ZOFRAN) 2 mg/mL injection, 4 mg, Intravenous, Q6H PRN  pantoprazole (PROTONIX) 4 mg/mL injection, 40 mg, Intravenous, Q12H        Allergies   Allergen Reactions    Amoxicillin Anaphylaxis    Adhesive Rash, Itching and Hives/ Urticaria    Nickel Rash and Itching       Objective     Vital Signs:  Temp (24hrs) Max:36.8 C (98.2 F)      Systolic (24hrs), Avg:118 , Min:95 , Max:144     Diastolic (24hrs), Avg:57, Min:40,  Max:87    Temp  Avg: 36.7 C (98.1 F)  Min: 36.6 C (97.9 F)  Max: 36.8 C (98.2 F)  MAP (Non-Invasive)  Avg: 73.3 mmHG  Min: 57 mmHG  Max: 101 mmHG  Pulse  Avg: 89.9  Min: 81  Max: 104  Resp  Avg: 18.6  Min: 18  Max: 21  SpO2  Avg: 93.6 %  Min: 90 %  Max: 100 %       I/O:  I/O last 24 hours:    Intake/Output Summary (Last 24 hours) at 04/26/2023 0921  Last data filed at 04/26/2023 0843  Gross per 24 hour   Intake 1720 ml   Output --   Net 1720 ml     I/O current shift:  09/21 0700 - 09/21 1859  In: 0.83   Out: -       Labs:  No results found for: "ALPHFETOPROT", "CA199X"   CBC  Diff   Lab Results   Component Value Date/Time    WBC 8.4 04/26/2023 04:51 AM    HGB 13.1 04/26/2023 04:51 AM    HCT 38.5 04/26/2023 04:51 AM    PLTCNT 241 04/26/2023 04:51 AM    RBC 3.97 04/26/2023 04:51 AM    MCV 97.0 (H) 04/26/2023 04:51 AM    MCHC 34.0 04/26/2023 04:51 AM    MCH 33.0 (H) 04/26/2023 04:51 AM    RDW 13.3 04/26/2023 04:51 AM    MPV 9.7  04/26/2023 04:51 AM    Lab Results   Component Value Date/Time    PMNS 70 04/26/2023 04:51 AM    LYMPHOCYTES 20 04/26/2023 04:51 AM    EOSINOPHIL 1 04/26/2023 04:51 AM    MONOCYTES 9 04/26/2023 04:51 AM    BASOPHILS 0 04/26/2023 04:51 AM    BASOPHILS 0.00 04/26/2023 04:51 AM    PMNABS 5.90 04/26/2023 04:51 AM    LYMPHSABS 1.70 04/26/2023 04:51 AM    EOSABS 0.10 04/26/2023 04:51 AM    MONOSABS 0.80 04/26/2023 04:51 AM           Last BMP  (Last result in the past 24 hours)        Na   K   Cl   CO2   BUN   Cr   Calcium   Glucose   Glucose-Fasting        04/26/23 0451 143   4.5   112   23   30   1.69   9.4   111               Last Hepatic Panel  (Last result in the past 24 hours)        Albumin   Total PTN   Total Bili   Direct Bili   Ast/SGOT   Alt/SGPT   Alk Phos        04/26/23 0451 3.2   5.2   0.4     15   11    182                Radiology:    No results found for this or any previous visit (from the past [id number] hour(s)).  No results found for this or any previous visit (from the past  [id number] hour(s)).  Recent Results (from the past [id number] hour(s))   CT ABDOMEN PELVIS WO IV CONTRAST    Collection Time: 04/24/23  2:43 PM    Narrative    Paula Cochran    RADIOLOGIST: Alvester Chou, MD    CT ABDOMEN PELVIS WO IV CONTRAST performed on 04/24/2023 2:43 PM    CLINICAL HISTORY: Decreased appetite, pain in the pelvic region.  Lower abdominal and pelvic pain  Decreased appetite, pain in the pelvic region, constipation    TECHNIQUE:  Abdomen and pelvis CT without intravenous contrast.    COMPARISON:  None.    FINDINGS:  Noncontrast technique limits evaluation of the abdominal and pelvic viscera.    Lung bases: Clear    Liver:   There is some subtle marginal nodularity of the liver suggesting cirrhotic morphology. There is a 1 cm low-density lesion in the right lobe of the liver which is too small to characterize.    Gallbladder:   There are calcified gallstones in the gallbladder with some gallbladder distention. No surrounding inflammatory changes are identified.    Spleen:   Unremarkable.    Pancreas:   Unremarkable.    Adrenals:   Unremarkable.    Kidneys:   Unremarkable.    Bladder:  Unremarkable.    Uterus and Adnexa:  Prior hysterectomy.  Adnexal regions are unremarkable.    Bowel:   There is some wall thickening in the distal esophagus. This could be directly visualized.    There is a moderate amount of fecal matter throughout the colon. No colonic or small bowel distention. No evidence of bowel obstruction.    There is some colonic diverticulosis present.Marland Kitchen    Appendix:  Not visualized  Lymph nodes:  No suspicious lymph node enlargement.    Vasculature:   Mild diffuse atherosclerotic calcifications are noted.     Peritoneum / Retroperitoneum: No ascites.  No free air.    Bones:   Degenerative changes of the spine.          Impression    1. CHOLELITHIASIS WITH GALLBLADDER DISTENTION. NO SURROUNDING INFLAMMATORY CHANGES  2. WALL THICKENING IN THE DISTAL ESOPHAGUS COULD BE DIRECTLY  VISUALIZED  3. CIRRHOTIC MORPHOLOGY OF THE LIVER       One or more dose reduction techniques were used (e.g., Automated exposure control, adjustment of the mA and/or kV according to patient size, use of iterative reconstruction technique).      Radiologist location ID: [id number]       Recent Results (from the past [id number] hour(s))   MRI KIDNEY MASS WO CONTRAST    Collection Time: 12/18/22  2:42 PM    Narrative    Paula Baton Wilbon    RADIOLOGIST: Markus Jarvis, MD    MRI KIDNEY MASS WO CONTRAST performed on 12/18/2022 2:42 PM    CLINICAL HISTORY: N28.89: Left renal mass.  left renal mass    TECHNIQUE:  Noncontrast 'kidney' protocol abdominal MRI. This is a limited exam.    COMPARISON:  None.    FINDINGS:  Kidneys: The kidneys are normally positioned, and the renal sizes are grossly symmetric. There is no hydronephrosis. Multiple tiny cystic lesions can be seen throughout both kidneys too numerous to count in most too small to measure accurately. The largest left cyst is about 5 mm in diameter and the largest right cyst is 9.6 mm in diameter.    Lymph Nodes: No para-aortic lymphadenopathy.    The noncontrast appearances of the visualized liver, spleen, pancreas, and adrenals are grossly unremarkable. No free fluid at visualized levels.        Impression    MULTIPLE TINY CYSTS SCATTERED THROUGHOUT BOTH KIDNEYS. NO SOLID APPEARING MASS WAS IDENTIFIED.        Radiologist location ID: [id number]       No results found for this or any previous visit (from the past [id number] hour(s)).  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  CKD    TECHNIQUE: Bilateral renal ultrasound.    COMPARISON:  None.    FINDINGS:  RIGHT Kidney       Size: 8.4 cm x 3.2 cm x 3.9 cm        Volume: 54 ml    LEFT Kidney       Size: 9.2 cm x 3.1 cm x 3.8 cm        Volume: 57 ml    Right kidney:  Small with senescent changes present.   Hydronephrosis: None.  Perinephric fluid: None.  Cysts: None.  Solid lesions: None.  Calcifications: None.    Left kidney:  Small with senescent changes present.   Hydronephrosis: None.  Perinephric fluid: None.  Cysts:  1. 2 cm mid renal cyst..  Solid lesions:  1. 2.8 cm hypoechoic midpole mass suggested, and noncystic. Mildly inhomogeneous..  Calcifications: None.    Loss of cortical medullary differentiation suggests chronic renal disease.      Impression    Senescent changes.  Left renal cyst and left renal noncystic mass.        Radiologist location ID: [id number]         Microbiology:  No results found for any visits on 04/24/23 (from the past 24 hour(s)).     Assessment/ Plan:   Nausea/vomiting    Continue current management. EGD with Dr. Concha Se yesterday revealed moderate distal esophagitis, grade 2, superficial ulcers at the GE junction, small hiatal hernia, moderate gastritis, multiple small duodenal ulcers. Recommendations include Pantoprazole 40 mg po bid, before breakfast and before dinner, Famotidine 40 mg po q hs. She is getting both of these via IV at this time due to vomiting. Will also continue Zofran as this has improved her vomiting. Will continue clear liquids for now but plan to advance diet as tolerated. Patient discussed with Dr. Concha Se to form plan of care//        [facility], NP-C

## 2023-04-26 NOTE — Progress Notes (Signed)
[facility]  IP PROGRESS NOTE      Paula Cochran, Paula Cochran  Date of Admission:  04/24/2023  Date of Birth:  30-Mar-1946  Date of Service:  04/26/2023    Hospital Day:  LOS: 2 days       Subjective:   Pt seen and examined for f/u esophageal thickening, cholelithiasis, bipolar disorder, generalized weakness, frequent falls . She had several episodes of vomiting yesterday per nursing and diet was changed to clear liquid and meds to IV. Pt reports she feels better today. Tolerating po meds and clear liquid intake. No new complaints. Vomiting resolved overnight. No other concerns reported by nursing staff.   Vital Signs:  Temp (24hrs) Max:36.8 C (98.2 F)      Temperature: 36.5 C (97.7 F)  BP (Non-Invasive): (!) 100/56  MAP (Non-Invasive): 65 mmHG  Heart Rate: 84  Respiratory Rate: 16  SpO2: 95 %    Current Medications:  acetaminophen (TYLENOL) tablet, 650 mg, Oral, Q4H PRN  albuterol 90 mcg per inhalation oral inhaler - "Respiratory to administer", 1 Puff, Inhalation, Q6H PRN  allopurinol (ZYLOPRIM) tablet, 100 mg, Oral, Daily  ARIPiprazole (ABILIFY) tablet, 5 mg, Oral, Daily  ARIPiprazole (ABILIFY) tablet, 10 mg, Oral, Once  docusate sodium (COLACE) capsule, 200 mg, Oral, Daily  famotidine (PEPCID) tablet, 40 mg, Oral, NIGHTLY  heparin 5,000 unit/mL injection, 5,000 Units, Subcutaneous, Q8HRS  lamoTRIgine (LaMICtal) tablet, 200 mg, Oral, Daily  LORazepam (ATIVAN) tablet 1.5 mg, 1.5 mg, Oral, NIGHTLY  NS flush syringe, 3 mL, Intracatheter, Q8HRS  NS flush syringe, 3 mL, Intracatheter, Q1H PRN  NS flush syringe, 3 mL, Intracatheter, Q8HRS  NS flush syringe, 3 mL, Intracatheter, Q1H PRN  NS premix infusion, , Intravenous, Continuous  nystatin (NYSTOP) 100,000 units/g topical powder, , Apply Topically, 2x/day  OLANZapine (zyPREXA) tablet, 2.5 mg, Oral, NIGHTLY  [START ON 04/29/2023] OLANZapine (zyPREXA) tablet, 5 mg, Oral, NIGHTLY  ondansetron (ZOFRAN) 2 mg/mL injection, 4 mg, Intravenous, Q6H  PRN  pantoprazole (PROTONIX) delayed release tablet, 40 mg, Oral, 2x/day AC        Current Orders:  Active Orders   Lab    CBC/DIFF     Frequency: 0530 - AM DRAW     Number of Occurrences: 1 Occurrences    COMPREHENSIVE METABOLIC PANEL, NON-FASTING     Frequency: 0530 - AM DRAW     Number of Occurrences: 1 Occurrences    MAGNESIUM     Frequency: 0530 - AM DRAW     Number of Occurrences: 1 Occurrences   Diet    DIET CLEAR LIQUID Do you want to initiate MNT Protocol? Yes     Frequency: All Meals     Number of Occurrences: 1 Occurrences   Nursing    ACTIVITY     Frequency: UNTIL DISCONTINUED     Number of Occurrences: Until Specified    CONTINUOUS CARDIAC MONITORING (ED USE ONLY)     Frequency: ONE TIME     Number of Occurrences: 1 Occurrences    INTAKE AND OUTPUT QSHIFT     Frequency: QSHIFT     Number of Occurrences: Until Specified    MD TO NURSE:  IF YOU ANTICIPATE DIFFICULTY OR ARE UNBLE TO OBTAIN CULTURES WITHIN 30 MIN OF THIS ORDER, PLEASE CONTACT THE ORDERING PROVIDER FOR FURTHER DIRECTION.  TIMELY ANTIBIOTIC ADMINISTRATION IS IMPERATIVE TO TREATING A PATIENT WITH SEPSIS.     Frequency: UNTIL DISCONTINUED     Number of Occurrences: Until Specified    MD TO  NURSE: IF UNABLE TO READILY OBTAIN ADEQUATE IV ACCESS WITHIN 15 MINUTES OF THIS ORDER, PLEASE NOTIFY PROVIDER IMMEDIATELY FOR FURTHER DIRECTION.     Frequency: UNTIL DISCONTINUED     Number of Occurrences: Until Specified    MISCELLANEOUS MD/DO TO NURSE     Frequency: UNTIL DISCONTINUED     Number of Occurrences: Until Specified     Order Comments: Please have unit social worker add a list of psychiatric providers in patient's area to discharge summary before discharge as daughter wants to switch. Thank you      Notify MD Vital Signs     Frequency: PRN     Number of Occurrences: Until Specified    PT IS INTERMEDIATE RISK FOR VENOUS THROMBOEMBOLISM     Frequency: CONTINUOUS     Number of Occurrences: Until Specified    PULSE OXIMETRY Q4H     Frequency: Q4H      Number of Occurrences: Until Specified    SOAP SUDS ENEMA     Frequency: ONE TIME     Number of Occurrences: 1 Occurrences     Order Comments:         STRAIGHT CATH     Frequency: ONE TIME     Number of Occurrences: 1 Occurrences    TELEMETRY MONITORING - Continuous     Frequency: CONTINUOUS     Number of Occurrences: Until Specified    VITAL SIGNS  Q2H     Frequency: Q2H     Number of Occurrences: 250 Occurrences    VITAL SIGNS  Q4H     Frequency: Q4H     Number of Occurrences: Until Specified    WEIGH PATIENT     Frequency: UPON ADMISSION     Number of Occurrences: 1 Occurrences   Consult    IP CONSULT TO CARE MANAGEMENT     Frequency: ONE TIME     Number of Occurrences: 1 Occurrences    IP CONSULT TO GASTROENTEROLOGY On-Call Provider (nurse/clerk to determine)     Frequency: ONE TIME     Number of Occurrences: 1 Occurrences    IP CONSULT TO GENERAL SURGERY Requested Provider; DUREMDES, GENE B     Frequency: ONE TIME     Number of Occurrences: 1 Occurrences    IP CONSULT TO PSYCHIATRY On-Call Provider (nurse/clerk to determine)     Frequency: ONE TIME     Number of Occurrences: 1 Occurrences   OT    OT EVAL & TREAT     Frequency: Per Therapist Discretion     Number of Occurrences: 1 Occurrences     Scheduling Instructions:               PT    PT EVALUATE AND TREAT     Frequency: Per Therapist Discretion     Number of Occurrences: 1 Occurrences     Order Comments: If patient's O2 level drops, PT may increase O2 until sats are > 93%.       Scheduling Instructions:               Point of Care Testing    PERFORM POC WHOLE BLOOD GLUCOSE     Frequency: ONE TIME     Number of Occurrences: 1 Occurrences   IV    INSERT & MAINTAIN PERIPHERAL IV ACCESS     Frequency: UNTIL DISCONTINUED     Number of Occurrences: Until Specified    PERIPHERAL IV DRESSING CHANGE     Frequency: PRN  Number of Occurrences: Until Specified   Precaution    ASPIRATION PRECAUTIONS     Frequency: UNTIL DISCONTINUED     Number of Occurrences: Until  Specified   Case Request    CASE REQUEST SURGICAL: GASTROSCOPY     Frequency: ONE TIME     Number of Occurrences: 1 Occurrences   Medications    acetaminophen (TYLENOL) tablet     Frequency: Q4H PRN     Dose: 650 mg     Route: Oral    albuterol 90 mcg per inhalation oral inhaler - "Respiratory to administer"     Frequency: Q6H PRN     Dose: 1 Puff     Route: Inhalation    allopurinol (ZYLOPRIM) tablet     Frequency: Daily     Dose: 100 mg     Route: Oral    ARIPiprazole (ABILIFY) tablet     Frequency: Daily     Dose: 5 mg     Route: Oral    ARIPiprazole (ABILIFY) tablet     Frequency: Once     Dose: 10 mg     Route: Oral    docusate sodium (COLACE) capsule     Frequency: Daily     Dose: 200 mg     Route: Oral    famotidine (PEPCID) tablet     Frequency: NIGHTLY     Dose: 40 mg     Route: Oral    heparin 5,000 unit/mL injection     Frequency: Q8HRS     Dose: 5,000 Units     Route: Subcutaneous    lamoTRIgine (LaMICtal) tablet     Frequency: Daily     Dose: 200 mg     Route: Oral    LORazepam (ATIVAN) tablet 1.5 mg     Frequency: NIGHTLY     Dose: 1.5 mg     Route: Oral    NS flush syringe     Frequency: Q8HRS     Dose: 3 mL     Route: Intracatheter    NS flush syringe     Frequency: Q1H PRN     Dose: 3 mL     Route: Intracatheter    NS flush syringe     Frequency: Q8HRS     Dose: 3 mL     Route: Intracatheter    NS flush syringe     Frequency: Q1H PRN     Dose: 3 mL     Route: Intracatheter    NS premix infusion     Frequency: Continuous     Route: Intravenous    nystatin (NYSTOP) 100,000 units/g topical powder     Frequency: 2x/day     Route: Apply Topically    OLANZapine (zyPREXA) tablet     Frequency: NIGHTLY     Dose: 2.5 mg     Route: Oral    OLANZapine (zyPREXA) tablet     Frequency: NIGHTLY     Dose: 5 mg     Route: Oral    ondansetron (ZOFRAN) 2 mg/mL injection     Frequency: Q6H PRN     Dose: 4 mg     Route: Intravenous    pantoprazole (PROTONIX) delayed release tablet     Frequency: 2x/day AC     Dose: 40  mg     Route: Oral        Review of Systems:  Focused review of system was completed. Refer to the HPI for ROS details.  Today's Physical Exam:  Physical Exam  Constitutional:       General: She is not in acute distress.  Cardiovascular:      Rate and Rhythm: Normal rate and regular rhythm.      Heart sounds: No murmur heard.  Pulmonary:      Effort: Pulmonary effort is normal. No respiratory distress.      Breath sounds: Normal breath sounds. No wheezing or rales.   Abdominal:      General: Bowel sounds are normal. There is no distension.      Palpations: Abdomen is soft.      Tenderness: There is no abdominal tenderness.   Musculoskeletal:      Right lower leg: No edema.      Left lower leg: No edema.   Skin:     General: Skin is warm and dry.   Neurological:      General: No focal deficit present.      Mental Status: She is alert and oriented to person, place, and time.              I/O:  I/O last 24 hours:    Intake/Output Summary (Last 24 hours) at 04/26/2023 1300  Last data filed at 04/26/2023 1200  Gross per 24 hour   Intake 1550 ml   Output --   Net 1550 ml     I/O current shift:  09/21 0700 - 09/21 1859  In: 330.83 [P.O.:330]  Out: -       Labs  Please indicate ordered or reviewed)  Reviewed: I have reviewed all lab results.  Lab Results Today:    Results for orders placed or performed during the hospital encounter of 04/24/23 (from the past 24 hour(s))   MAGNESIUM   Result Value Ref Range    MAGNESIUM 2.6 1.9 - 2.7 mg/dL   COMPREHENSIVE METABOLIC PANEL, NON-FASTING   Result Value Ref Range    SODIUM 143 136 - 145 mmol/L    POTASSIUM 4.5 3.5 - 5.1 mmol/L    CHLORIDE 112 (H) 98 - 107 mmol/L    CO2 TOTAL 23 21 - 31 mmol/L    ANION GAP 8 4 - 13 mmol/L    BUN 30 (H) 7 - 25 mg/dL    CREATININE 1.61 (H) 0.60 - 1.30 mg/dL    BUN/CREA RATIO 18 6 - 22    ESTIMATED GFR 31 (L) >59 mL/min/1.17m^2    ALBUMIN 3.2 (L) 3.5 - 5.7 g/dL    CALCIUM 9.4 8.6 - 09.6 mg/dL    GLUCOSE 045 (H) 74 - 109 mg/dL    ALKALINE PHOSPHATASE  182 (H) 34 - 104 U/L    ALT (SGPT) 11 7 - 52 U/L    AST (SGOT) 15 13 - 39 U/L    BILIRUBIN TOTAL 0.4 0.3 - 1.0 mg/dL    PROTEIN TOTAL 5.2 (L) 6.4 - 8.9 g/dL    ALBUMIN/GLOBULIN RATIO 1.6 (H) 0.8 - 1.4    OSMOLALITY, CALCULATED 292 (H) 270 - 290 mOsm/kg    CALCIUM, CORRECTED 10.0 8.9 - 10.8 mg/dL    GLOBULIN 2.0 (L) 2.9 - 5.4   CBC WITH DIFF   Result Value Ref Range    WBC 8.4 3.8 - 11.8 x10^3/uL    RBC 3.97 3.63 - 4.92 x10^6/uL    HGB 13.1 10.9 - 14.3 g/dL    HCT 40.9 81.1 - 91.4 %    MCV 97.0 (H) 75.5 - 95.3 fL    MCH 33.0 (H)  24.7 - 32.8 pg    MCHC 34.0 32.3 - 35.6 g/dL    RDW 16.1 09.6 - 04.5 %    PLATELETS 241 140 - 440 x10^3/uL    MPV 9.7 7.9 - 10.8 fL    NEUTROPHIL % 70 43 - 77 %    LYMPHOCYTE % 20 16 - 44 %    MONOCYTE % 9 5 - 13 %    EOSINOPHIL % 1 1 - 7 %    BASOPHIL % 0 0 - 1 %    NEUTROPHIL # 5.90 1.85 - 7.80 x10^3/uL    LYMPHOCYTE # 1.70 1.00 - 3.00 x10^3/uL    MONOCYTE # 0.80 0.30 - 1.00 x10^3/uL    EOSINOPHIL # 0.10 0.00 - 0.50 x10^3/uL    BASOPHIL # 0.00 0.00 - 0.10 x10^3/uL           Problem List:  Active Hospital Problems   (*Primary Problem)    Diagnosis    *Weakness generalized    Bipolar 1 disorder, depressed, moderate (CMS HCC)    Constipation    Fall    Gallstones    Esophageal thickening    CKD (chronic kidney disease) stage 4, 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 Continue bid PPI and qhs Pepcid. Was evaluated by psychiatry service and Abilify will be tapered (decreased to 5mg  p daily x 4 days then DC ) and pt to begin olanzapine 2.5mg  po qhs x 4 days then increase to 5mg  po qhs. Continue lamotrigine 200 mg po daily. PT eval pending as she was too ill to participate yesterday. Likely will still need rehab at DC.     Further recommendations depending on clinical course.  Specialists' input appreciated.     The Hospitalist personally  evaluated and examined the patient in conjunction with the MLP and agree with the assessments, treatment plan and disposition of the patient as recorded by the [facility].    Laurey Morale, MSPA, [employer]  [facility]    DVT/PE Prophylaxis: SCDs/ Venodynes/Impulse boots

## 2023-04-27 LAB — CBC WITH DIFF
BASOPHIL #: 0.1 10*3/uL (ref 0.00–0.10)
BASOPHIL %: 1 % (ref 0–1)
EOSINOPHIL #: 0.4 10*3/uL (ref 0.00–0.50)
EOSINOPHIL %: 4 % (ref 1–7)
HCT: 35.2 % (ref 31.2–41.9)
HGB: 12.1 g/dL (ref 10.9–14.3)
LYMPHOCYTE #: 2.9 10*3/uL (ref 1.00–3.00)
LYMPHOCYTE %: 28 % (ref 16–44)
MCH: 33.4 pg — ABNORMAL HIGH (ref 24.7–32.8)
MCHC: 34.4 g/dL (ref 32.3–35.6)
MCV: 97.3 fL — ABNORMAL HIGH (ref 75.5–95.3)
MONOCYTE #: 0.7 10*3/uL (ref 0.30–1.00)
MONOCYTE %: 7 % (ref 5–13)
MPV: 10.2 fL (ref 7.9–10.8)
NEUTROPHIL #: 6.1 10*3/uL (ref 1.85–7.80)
NEUTROPHIL %: 60 % (ref 43–77)
PLATELETS: 204 10*3/uL (ref 140–440)
RBC: 3.62 10*6/uL — ABNORMAL LOW (ref 3.63–4.92)
RDW: 13.4 % (ref 12.3–17.7)
WBC: 10.2 10*3/uL (ref 3.8–11.8)

## 2023-04-27 LAB — COMPREHENSIVE METABOLIC PANEL, NON-FASTING
ALBUMIN/GLOBULIN RATIO: 1.8 — ABNORMAL HIGH (ref 0.8–1.4)
ALBUMIN: 3.1 g/dL — ABNORMAL LOW (ref 3.5–5.7)
ALKALINE PHOSPHATASE: 159 U/L — ABNORMAL HIGH (ref 34–104)
ALT (SGPT): 10 U/L (ref 7–52)
ANION GAP: 6 mmol/L (ref 4–13)
AST (SGOT): 18 U/L (ref 13–39)
BILIRUBIN TOTAL: 0.4 mg/dL (ref 0.3–1.0)
BUN/CREA RATIO: 16 (ref 6–22)
BUN: 29 mg/dL — ABNORMAL HIGH (ref 7–25)
CALCIUM, CORRECTED: 9.7 mg/dL (ref 8.9–10.8)
CALCIUM: 9 mg/dL (ref 8.6–10.3)
CHLORIDE: 115 mmol/L — ABNORMAL HIGH (ref 98–107)
CO2 TOTAL: 22 mmol/L (ref 21–31)
CREATININE: 1.82 mg/dL — ABNORMAL HIGH (ref 0.60–1.30)
ESTIMATED GFR: 28 mL/min/{1.73_m2} — ABNORMAL LOW (ref >59)
GLOBULIN: 1.7 — ABNORMAL LOW (ref 2.9–5.4)
GLUCOSE: 94 mg/dL (ref 74–109)
OSMOLALITY, CALCULATED: 291 mOsm/kg — ABNORMAL HIGH (ref 270–290)
POTASSIUM: 4.4 mmol/L (ref 3.5–5.1)
PROTEIN TOTAL: 4.8 g/dL — ABNORMAL LOW (ref 6.4–8.9)
SODIUM: 143 mmol/L (ref 136–145)

## 2023-04-27 LAB — MAGNESIUM: MAGNESIUM: 2.6 mg/dL (ref 1.9–2.7)

## 2023-04-27 LAB — SCAN DIFFERENTIAL
PLATELET MORPHOLOGY COMMENT: ADEQUATE
RBC MORPHOLOGY COMMENT: NORMAL

## 2023-04-27 NOTE — Consults (Signed)
[facility]  Gastroenterology/ Hepatology Progress Note      Paula Cochran  Date of service: 04/27/2023    Subjective      She denies any abd pain or N/V.  Tolerating liquids.      acetaminophen (TYLENOL) tablet, 650 mg, Oral, Q4H PRN  albuterol 90 mcg per inhalation oral inhaler - "Respiratory to administer", 1 Puff, Inhalation, Q6H PRN  allopurinol (ZYLOPRIM) tablet, 100 mg, Oral, Daily  ARIPiprazole (ABILIFY) tablet, 5 mg, Oral, Daily  ARIPiprazole (ABILIFY) tablet, 10 mg, Oral, Once  docusate sodium (COLACE) capsule, 200 mg, Oral, Daily  famotidine (PEPCID) tablet, 40 mg, Oral, NIGHTLY  heparin 5,000 unit/mL injection, 5,000 Units, Subcutaneous, Q8HRS  lamoTRIgine (LaMICtal) tablet, 200 mg, Oral, Daily  LORazepam (ATIVAN) tablet 1.5 mg, 1.5 mg, Oral, NIGHTLY  NS flush syringe, 3 mL, Intracatheter, Q8HRS  NS flush syringe, 3 mL, Intracatheter, Q1H PRN  NS flush syringe, 3 mL, Intracatheter, Q8HRS  NS flush syringe, 3 mL, Intracatheter, Q1H PRN  NS premix infusion, , Intravenous, Continuous  nystatin (NYSTOP) 100,000 units/g topical powder, , Apply Topically, 2x/day  OLANZapine (zyPREXA) tablet, 2.5 mg, Oral, NIGHTLY  [START ON 04/29/2023] OLANZapine (zyPREXA) tablet, 5 mg, Oral, NIGHTLY  ondansetron (ZOFRAN) 2 mg/mL injection, 4 mg, Intravenous, Q6H PRN  pantoprazole (PROTONIX) delayed release tablet, 40 mg, Oral, 2x/day AC        Allergies   Allergen Reactions    Amoxicillin Anaphylaxis    Adhesive Rash, Itching and Hives/ Urticaria    Nickel Rash and Itching       Objective     Vital Signs:  Temp (24hrs) Max:37 C (98.6 F)      Systolic (24hrs), Avg:131 , Min:100 , Max:154     Diastolic (24hrs), Avg:67, Min:56, Max:87    Temp  Avg: 36.7 C (98.1 F)  Min: 36.5 C (97.7 F)  Max: 37 C (98.6 F)  MAP (Non-Invasive)  Avg: 84.3 mmHG  Min: 65 mmHG  Max: 106 mmHG  Pulse  Avg: 85  Min: 75  Max: 97  Resp  Avg: 17  Min: 16  Max: 18  SpO2  Avg: 95 %  Min: 94 %  Max: 96 %       I/O:  I/O last 24  hours:    Intake/Output Summary (Last 24 hours) at 04/27/2023 0814  Last data filed at 04/27/2023 0600  Gross per 24 hour   Intake 780.83 ml   Output --   Net 780.83 ml     I/O current shift:  No intake/output data recorded.    Physical Exam:  Constitutional:  no distress  Respiratory:  Breathing nonlabored  Cardiovascular:  regular rate and rhythm on the monitor  Gastrointestinal:  Soft, non-tender, Bowel sounds normal    Labs:  No results found for: "ALPHFETOPROT", "CA199X"   CBC  Diff   Lab Results   Component Value Date/Time    WBC 10.2 04/27/2023 05:14 AM    HGB 12.1 04/27/2023 05:14 AM    HCT 35.2 04/27/2023 05:14 AM    PLTCNT 204 04/27/2023 05:14 AM    RBC 3.62 (L) 04/27/2023 05:14 AM    MCV 97.3 (H) 04/27/2023 05:14 AM    MCHC 34.4 04/27/2023 05:14 AM    MCH 33.4 (H) 04/27/2023 05:14 AM    RDW 13.4 04/27/2023 05:14 AM    MPV 10.2 04/27/2023 05:14 AM    Lab Results   Component Value Date/Time    PMNS 60 04/27/2023 05:14 AM  LYMPHOCYTES 28 04/27/2023 05:14 AM    EOSINOPHIL 4 04/27/2023 05:14 AM    MONOCYTES 7 04/27/2023 05:14 AM    BASOPHILS 1 04/27/2023 05:14 AM    BASOPHILS 0.10 04/27/2023 05:14 AM    PMNABS 6.10 04/27/2023 05:14 AM    LYMPHSABS 2.90 04/27/2023 05:14 AM    EOSABS 0.40 04/27/2023 05:14 AM    MONOSABS 0.70 04/27/2023 05:14 AM           Last BMP  (Last result in the past 24 hours)        Na   K   Cl   CO2   BUN   Cr   Calcium   Glucose   Glucose-Fasting        04/27/23 0514 143   4.4   115   22   29   1.82   9.0   94               Last Hepatic Panel  (Last result in the past 24 hours)        Albumin   Total PTN   Total Bili   Direct Bili   Ast/SGOT   Alt/SGPT   Alk Phos        04/27/23 0514 3.1   4.8   0.4     18   10    159                Radiology:    No results found for this or any previous visit (from the past [id number] hour(s)).  No results found for this or any previous visit (from the past 202542706 hour(s)).  Recent Results (from the past 237628315 hour(s))   CT ABDOMEN PELVIS WO IV  CONTRAST    Collection Time: 04/24/23  2:43 PM    Narrative    Lexine Baton Bunker    RADIOLOGIST: Alvester Chou, MD    CT ABDOMEN PELVIS WO IV CONTRAST performed on 04/24/2023 2:43 PM    CLINICAL HISTORY: Decreased appetite, pain in the pelvic region.  Lower abdominal and pelvic pain  Decreased appetite, pain in the pelvic region, constipation    TECHNIQUE:  Abdomen and pelvis CT without intravenous contrast.    COMPARISON:  None.    FINDINGS:  Noncontrast technique limits evaluation of the abdominal and pelvic viscera.    Lung bases: Clear    Liver:   There is some subtle marginal nodularity of the liver suggesting cirrhotic morphology. There is a 1 cm low-density lesion in the right lobe of the liver which is too small to characterize.    Gallbladder:   There are calcified gallstones in the gallbladder with some gallbladder distention. No surrounding inflammatory changes are identified.    Spleen:   Unremarkable.    Pancreas:   Unremarkable.    Adrenals:   Unremarkable.    Kidneys:   Unremarkable.    Bladder:  Unremarkable.    Uterus and Adnexa:  Prior hysterectomy.  Adnexal regions are unremarkable.    Bowel:   There is some wall thickening in the distal esophagus. This could be directly visualized.    There is a moderate amount of fecal matter throughout the colon. No colonic or small bowel distention. No evidence of bowel obstruction.    There is some colonic diverticulosis present.Marland Kitchen    Appendix:  Not visualized    Lymph nodes:  No suspicious lymph node enlargement.    Vasculature:   Mild diffuse atherosclerotic calcifications are noted.  Peritoneum / Retroperitoneum: No ascites.  No free air.    Bones:   Degenerative changes of the spine.          Impression    1. CHOLELITHIASIS WITH GALLBLADDER DISTENTION. NO SURROUNDING INFLAMMATORY CHANGES  2. WALL THICKENING IN THE DISTAL ESOPHAGUS COULD BE DIRECTLY VISUALIZED  3. CIRRHOTIC MORPHOLOGY OF THE LIVER       One or more dose reduction techniques were used  (e.g., Automated exposure control, adjustment of the mA and/or kV according to patient size, use of iterative reconstruction technique).      Radiologist location ID: [id number]       Recent Results (from the past [id number] hour(s))   MRI KIDNEY MASS WO CONTRAST    Collection Time: 12/18/22  2:42 PM    Narrative    Lexine Baton Braddy    RADIOLOGIST: Markus Jarvis, MD    MRI KIDNEY MASS WO CONTRAST performed on 12/18/2022 2:42 PM    CLINICAL HISTORY: N28.89: Left renal mass.  left renal mass    TECHNIQUE:  Noncontrast 'kidney' protocol abdominal MRI. This is a limited exam.    COMPARISON:  None.    FINDINGS:  Kidneys: The kidneys are normally positioned, and the renal sizes are grossly symmetric. There is no hydronephrosis. Multiple tiny cystic lesions can be seen throughout both kidneys too numerous to count in most too small to measure accurately. The largest left cyst is about 5 mm in diameter and the largest right cyst is 9.6 mm in diameter.    Lymph Nodes: No para-aortic lymphadenopathy.    The noncontrast appearances of the visualized liver, spleen, pancreas, and adrenals are grossly unremarkable. No free fluid at visualized levels.        Impression    MULTIPLE TINY CYSTS SCATTERED THROUGHOUT BOTH KIDNEYS. NO SOLID APPEARING MASS WAS IDENTIFIED.        Radiologist location ID: [id number]       No results found for this or any previous visit (from the past [id number] hour(s)).  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  CKD    TECHNIQUE: Bilateral renal ultrasound.    COMPARISON: None.    FINDINGS:  RIGHT Kidney       Size: 8.4 cm x 3.2 cm x 3.9 cm        Volume: 54 ml    LEFT Kidney       Size: 9.2 cm x  3.1 cm x 3.8 cm        Volume: 57 ml    Right kidney:  Small with senescent changes present.   Hydronephrosis: None.  Perinephric fluid: None.  Cysts: None.  Solid lesions: None.  Calcifications: None.    Left kidney:  Small with senescent changes present.   Hydronephrosis: None.  Perinephric fluid: None.  Cysts:  1. 2 cm mid renal cyst..  Solid lesions:  1. 2.8 cm hypoechoic midpole mass suggested, and noncystic. Mildly inhomogeneous..  Calcifications:  None.    Loss of cortical medullary differentiation suggests chronic renal disease.      Impression    Senescent changes.  Left renal cyst and left renal noncystic mass.        Radiologist location ID: [id number]         Microbiology:  No results found for any visits on 04/24/23 (from the past 24 hour(s)).     Assessment/ Plan:   N/V - denies any vomiting.  Will advance diet to full liquids and then as tolerated.    GERD - continue Pantoprazole 40 mg bid, before breakfast and before dinner, Famotidine 40 mg q hs.  These can be changed back to oral route if no further vomiting.  Patient discussed in detail with Dr Concha Se and treatment plan decided by him.             Cooper Render, NP-C

## 2023-04-27 NOTE — Progress Notes (Signed)
[facility]  IP PROGRESS NOTE      Paula Cochran, Paula Cochran  Date of Admission:  04/24/2023  Date of Birth:  26-Dec-1945  Date of Service:  04/27/2023    Hospital Day:  LOS: 3 days       Subjective:   Pt seen and examined for f/u esophageal thickening, cholelithiasis, bipolar disorder, generalized weakness, frequent falls . Tolerating clear liquids. No n/v/ab pain. Feels weak and deconditioned but has no new complaints. Nurses report she has been pleasant and cooperative. Family present during exam.     Vital Signs:  Temp (24hrs) Max:37 C (98.6 F)      Temperature: 37 C (98.6 F)  BP (Non-Invasive): (!) 154/87  MAP (Non-Invasive): 106 mmHG  Heart Rate: 78  Respiratory Rate: 16  SpO2: 96 %    Current Medications:  acetaminophen (TYLENOL) tablet, 650 mg, Oral, Q4H PRN  albuterol 90 mcg per inhalation oral inhaler - "Respiratory to administer", 1 Puff, Inhalation, Q6H PRN  allopurinol (ZYLOPRIM) tablet, 100 mg, Oral, Daily  ARIPiprazole (ABILIFY) tablet, 5 mg, Oral, Daily  ARIPiprazole (ABILIFY) tablet, 10 mg, Oral, Once  docusate sodium (COLACE) capsule, 200 mg, Oral, Daily  famotidine (PEPCID) tablet, 40 mg, Oral, NIGHTLY  heparin 5,000 unit/mL injection, 5,000 Units, Subcutaneous, Q8HRS  lamoTRIgine (LaMICtal) tablet, 200 mg, Oral, Daily  LORazepam (ATIVAN) tablet 1.5 mg, 1.5 mg, Oral, NIGHTLY  NS flush syringe, 3 mL, Intracatheter, Q8HRS  NS flush syringe, 3 mL, Intracatheter, Q1H PRN  NS flush syringe, 3 mL, Intracatheter, Q8HRS  NS flush syringe, 3 mL, Intracatheter, Q1H PRN  NS premix infusion, , Intravenous, Continuous  nystatin (NYSTOP) 100,000 units/g topical powder, , Apply Topically, 2x/day  OLANZapine (zyPREXA) tablet, 2.5 mg, Oral, NIGHTLY  [START ON 04/29/2023] OLANZapine (zyPREXA) tablet, 5 mg, Oral, NIGHTLY  ondansetron (ZOFRAN) 2 mg/mL injection, 4 mg, Intravenous, Q6H PRN  pantoprazole (PROTONIX) delayed release tablet, 40 mg, Oral, 2x/day AC        Current Orders:  Active Orders   Lab     CBC/DIFF     Frequency: 0530 - AM DRAW     Number of Occurrences: 1 Occurrences    COMPREHENSIVE METABOLIC PANEL, NON-FASTING     Frequency: 0530 - AM DRAW     Number of Occurrences: 1 Occurrences    MAGNESIUM     Frequency: 0530 - AM DRAW     Number of Occurrences: 1 Occurrences   Diet    DIET FULL LIQUID Do you want to initiate MNT Protocol? Yes     Frequency: All Meals     Number of Occurrences: 1 Occurrences   Nursing    ACTIVITY     Frequency: UNTIL DISCONTINUED     Number of Occurrences: Until Specified    CONTINUOUS CARDIAC MONITORING (ED USE ONLY)     Frequency: ONE TIME     Number of Occurrences: 1 Occurrences    FALL RISK - INITIATE  MOTION/BED/CHAIR SENSOR     Frequency: UNTIL DISCONTINUED     Number of Occurrences: Until Specified    INTAKE AND OUTPUT QSHIFT     Frequency: QSHIFT     Number of Occurrences: Until Specified    MD TO NURSE:  IF YOU ANTICIPATE DIFFICULTY OR ARE UNBLE TO OBTAIN CULTURES WITHIN 30 MIN OF THIS ORDER, PLEASE CONTACT THE ORDERING PROVIDER FOR FURTHER DIRECTION.  TIMELY ANTIBIOTIC ADMINISTRATION IS IMPERATIVE TO TREATING A PATIENT WITH SEPSIS.     Frequency: UNTIL DISCONTINUED     Number  of Occurrences: Until Specified    MD TO NURSE: IF UNABLE TO READILY OBTAIN ADEQUATE IV ACCESS WITHIN 15 MINUTES OF THIS ORDER, PLEASE NOTIFY PROVIDER IMMEDIATELY FOR FURTHER DIRECTION.     Frequency: UNTIL DISCONTINUED     Number of Occurrences: Until Specified    MISCELLANEOUS MD/DO TO NURSE     Frequency: UNTIL DISCONTINUED     Number of Occurrences: Until Specified     Order Comments: Please have unit social worker add a list of psychiatric providers in patient's area to discharge summary before discharge as daughter wants to switch. Thank you      Notify MD Vital Signs     Frequency: PRN     Number of Occurrences: Until Specified    PT IS INTERMEDIATE RISK FOR VENOUS THROMBOEMBOLISM     Frequency: CONTINUOUS     Number of Occurrences: Until Specified    PULSE OXIMETRY Q4H     Frequency:  Q4H     Number of Occurrences: Until Specified    SOAP SUDS ENEMA     Frequency: ONE TIME     Number of Occurrences: 1 Occurrences     Order Comments:         STRAIGHT CATH     Frequency: ONE TIME     Number of Occurrences: 1 Occurrences    TELEMETRY MONITORING - Continuous     Frequency: CONTINUOUS     Number of Occurrences: Until Specified    VITAL SIGNS  Q2H     Frequency: Q2H     Number of Occurrences: 250 Occurrences    VITAL SIGNS  Q4H     Frequency: Q4H     Number of Occurrences: Until Specified    WEIGH PATIENT     Frequency: UPON ADMISSION     Number of Occurrences: 1 Occurrences   Consult    IP CONSULT TO CARE MANAGEMENT     Frequency: ONE TIME     Number of Occurrences: 1 Occurrences    IP CONSULT TO GASTROENTEROLOGY On-Call Provider (nurse/clerk to determine)     Frequency: ONE TIME     Number of Occurrences: 1 Occurrences    IP CONSULT TO GENERAL SURGERY Requested Provider; DUREMDES, GENE B     Frequency: ONE TIME     Number of Occurrences: 1 Occurrences    IP CONSULT TO PSYCHIATRY On-Call Provider (nurse/clerk to determine)     Frequency: ONE TIME     Number of Occurrences: 1 Occurrences   OT    OT EVAL & TREAT     Frequency: Per Therapist Discretion     Number of Occurrences: 1 Occurrences     Scheduling Instructions:               PT    PT EVALUATE AND TREAT     Frequency: Per Therapist Discretion     Number of Occurrences: 1 Occurrences     Order Comments: If patient's O2 level drops, PT may increase O2 until sats are > 93%.       Scheduling Instructions:               Point of Care Testing    PERFORM POC WHOLE BLOOD GLUCOSE     Frequency: ONE TIME     Number of Occurrences: 1 Occurrences   IV    INSERT & MAINTAIN PERIPHERAL IV ACCESS     Frequency: UNTIL DISCONTINUED     Number of Occurrences: Until Specified    PERIPHERAL  IV DRESSING CHANGE     Frequency: PRN     Number of Occurrences: Until Specified   Precaution    ASPIRATION PRECAUTIONS     Frequency: UNTIL DISCONTINUED     Number of  Occurrences: Until Specified   Case Request    CASE REQUEST SURGICAL: GASTROSCOPY     Frequency: ONE TIME     Number of Occurrences: 1 Occurrences   Medications    acetaminophen (TYLENOL) tablet     Frequency: Q4H PRN     Dose: 650 mg     Route: Oral    albuterol 90 mcg per inhalation oral inhaler - "Respiratory to administer"     Frequency: Q6H PRN     Dose: 1 Puff     Route: Inhalation    allopurinol (ZYLOPRIM) tablet     Frequency: Daily     Dose: 100 mg     Route: Oral    ARIPiprazole (ABILIFY) tablet     Frequency: Daily     Dose: 5 mg     Route: Oral    ARIPiprazole (ABILIFY) tablet     Frequency: Once     Dose: 10 mg     Route: Oral    docusate sodium (COLACE) capsule     Frequency: Daily     Dose: 200 mg     Route: Oral    famotidine (PEPCID) tablet     Frequency: NIGHTLY     Dose: 40 mg     Route: Oral    heparin 5,000 unit/mL injection     Frequency: Q8HRS     Dose: 5,000 Units     Route: Subcutaneous    lamoTRIgine (LaMICtal) tablet     Frequency: Daily     Dose: 200 mg     Route: Oral    LORazepam (ATIVAN) tablet 1.5 mg     Frequency: NIGHTLY     Dose: 1.5 mg     Route: Oral    NS flush syringe     Frequency: Q8HRS     Dose: 3 mL     Route: Intracatheter    NS flush syringe     Frequency: Q1H PRN     Dose: 3 mL     Route: Intracatheter    NS flush syringe     Frequency: Q8HRS     Dose: 3 mL     Route: Intracatheter    NS flush syringe     Frequency: Q1H PRN     Dose: 3 mL     Route: Intracatheter    NS premix infusion     Frequency: Continuous     Route: Intravenous    nystatin (NYSTOP) 100,000 units/g topical powder     Frequency: 2x/day     Route: Apply Topically    OLANZapine (zyPREXA) tablet     Frequency: NIGHTLY     Dose: 2.5 mg     Route: Oral    OLANZapine (zyPREXA) tablet     Frequency: NIGHTLY     Dose: 5 mg     Route: Oral    ondansetron (ZOFRAN) 2 mg/mL injection     Frequency: Q6H PRN     Dose: 4 mg     Route: Intravenous    pantoprazole (PROTONIX) delayed release tablet     Frequency:  2x/day AC     Dose: 40 mg     Route: Oral        Review of Systems:  Focused review of  system was completed. Refer to the HPI for ROS details.     Today's Physical Exam:  Physical Exam  Constitutional:       General: She is not in acute distress.  Cardiovascular:      Rate and Rhythm: Normal rate and regular rhythm.      Heart sounds: No murmur heard.  Pulmonary:      Effort: Pulmonary effort is normal. No respiratory distress.      Breath sounds: Normal breath sounds. No wheezing or rales.   Abdominal:      General: Bowel sounds are normal. There is no distension.      Palpations: Abdomen is soft.      Tenderness: There is no abdominal tenderness.   Musculoskeletal:      Right lower leg: No edema.      Left lower leg: No edema.   Skin:     General: Skin is warm and dry.   Neurological:      General: No focal deficit present.      Mental Status: She is alert and oriented to person, place, and time.              I/O:  I/O last 24 hours:    Intake/Output Summary (Last 24 hours) at 04/27/2023 1222  Last data filed at 04/27/2023 0600  Gross per 24 hour   Intake 450 ml   Output --   Net 450 ml     I/O current shift:  No intake/output data recorded.      Labs  Please indicate ordered or reviewed)  Reviewed: I have reviewed all lab results.  Lab Results Today:    Results for orders placed or performed during the hospital encounter of 04/24/23 (from the past 24 hour(s))   MAGNESIUM   Result Value Ref Range    MAGNESIUM 2.6 1.9 - 2.7 mg/dL   COMPREHENSIVE METABOLIC PANEL, NON-FASTING   Result Value Ref Range    SODIUM 143 136 - 145 mmol/L    POTASSIUM 4.4 3.5 - 5.1 mmol/L    CHLORIDE 115 (H) 98 - 107 mmol/L    CO2 TOTAL 22 21 - 31 mmol/L    ANION GAP 6 4 - 13 mmol/L    BUN 29 (H) 7 - 25 mg/dL    CREATININE 2.83 (H) 0.60 - 1.30 mg/dL    BUN/CREA RATIO 16 6 - 22    ESTIMATED GFR 28 (L) >59 mL/min/1.38m^2    ALBUMIN 3.1 (L) 3.5 - 5.7 g/dL    CALCIUM 9.0 8.6 - 15.1 mg/dL    GLUCOSE 94 74 - 761 mg/dL    ALKALINE PHOSPHATASE 159 (H)  34 - 104 U/L    ALT (SGPT) 10 7 - 52 U/L    AST (SGOT) 18 13 - 39 U/L    BILIRUBIN TOTAL 0.4 0.3 - 1.0 mg/dL    PROTEIN TOTAL 4.8 (L) 6.4 - 8.9 g/dL    ALBUMIN/GLOBULIN RATIO 1.8 (H) 0.8 - 1.4    OSMOLALITY, CALCULATED 291 (H) 270 - 290 mOsm/kg    CALCIUM, CORRECTED 9.7 8.9 - 10.8 mg/dL    GLOBULIN 1.7 (L) 2.9 - 5.4   CBC WITH DIFF   Result Value Ref Range    WBC 10.2 3.8 - 11.8 x10^3/uL    RBC 3.62 (L) 3.63 - 4.92 x10^6/uL    HGB 12.1 10.9 - 14.3 g/dL    HCT 60.7 37.1 - 06.2 %    MCV 97.3 (H) 75.5 - 95.3 fL  MCH 33.4 (H) 24.7 - 32.8 pg    MCHC 34.4 32.3 - 35.6 g/dL    RDW 16.1 09.6 - 04.5 %    PLATELETS 204 140 - 440 x10^3/uL    MPV 10.2 7.9 - 10.8 fL    NEUTROPHIL % 60 43 - 77 %    LYMPHOCYTE % 28 16 - 44 %    MONOCYTE % 7 5 - 13 %    EOSINOPHIL % 4 1 - 7 %    BASOPHIL % 1 0 - 1 %    NEUTROPHIL # 6.10 1.85 - 7.80 x10^3/uL    LYMPHOCYTE # 2.90 1.00 - 3.00 x10^3/uL    MONOCYTE # 0.70 0.30 - 1.00 x10^3/uL    EOSINOPHIL # 0.40 0.00 - 0.50 x10^3/uL    BASOPHIL # 0.10 0.00 - 0.10 x10^3/uL   SCAN DIFFERENTIAL   Result Value Ref Range    RBC MORPHOLOGY COMMENT Normal     PLATELET MORPHOLOGY COMMENT Adequate            Problem List:  Active Hospital Problems   (*Primary Problem)    Diagnosis    *Weakness generalized    Bipolar 1 disorder, depressed, moderate (CMS HCC)    Constipation    Fall    Gallstones    Esophageal thickening    CKD (chronic kidney disease) stage 4, 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 Continue bid PPI and qhs Pepcid. Was evaluated by psychiatry service and Abilify will be tapered (decreased to 5mg  p daily x 4 days then DC ) and pt to begin olanzapine 2.5mg  po qhs x 4 days then increase to 5mg  po qhs. Continue lamotrigine 200 mg po daily. PT eval pending as she was too ill to participate yesterday. Likely will still need rehab at DC.      04/27/23-diet advanced to full liquids. OOB to chair today and resume P.T. in am. Continue meds as above. Case management to resume DC planning upon return in am.    Further recommendations depending on clinical course.  Specialists' input appreciated.     The Hospitalist personally evaluated and examined the patient in conjunction with the MLP and agree with the assessments, treatment plan and disposition of the patient as recorded by the [facility].    Laurey Morale, MSPA, [facility]  [facility] Medicine    DVT/PE Prophylaxis: SCDs/ Venodynes/Impulse boots

## 2023-04-28 DIAGNOSIS — K31A19 Gastric intestinal metaplasia without dysplasia, unspecified site: Secondary | ICD-10-CM

## 2023-04-28 DIAGNOSIS — N179 Acute kidney failure, unspecified: Secondary | ICD-10-CM | POA: Insufficient documentation

## 2023-04-28 DIAGNOSIS — N184 Chronic kidney disease, stage 4 (severe): Secondary | ICD-10-CM | POA: Insufficient documentation

## 2023-04-28 DIAGNOSIS — D371 Neoplasm of uncertain behavior of stomach: Secondary | ICD-10-CM

## 2023-04-28 LAB — CBC WITH DIFF
BASOPHIL #: 0 10*3/uL (ref 0.00–0.10)
BASOPHIL %: 0 % (ref 0–1)
EOSINOPHIL #: 0.2 10*3/uL (ref 0.00–0.50)
EOSINOPHIL %: 4 % (ref 1–7)
HCT: 35.8 % (ref 31.2–41.9)
HGB: 12.1 g/dL (ref 10.9–14.3)
LYMPHOCYTE #: 1.9 10*3/uL (ref 1.00–3.00)
LYMPHOCYTE %: 30 % (ref 16–44)
MCH: 33.2 pg — ABNORMAL HIGH (ref 24.7–32.8)
MCHC: 33.8 g/dL (ref 32.3–35.6)
MCV: 98.3 fL — ABNORMAL HIGH (ref 75.5–95.3)
MONOCYTE #: 0.5 10*3/uL (ref 0.30–1.00)
MONOCYTE %: 7 % (ref 5–13)
MPV: 9.6 fL (ref 7.9–10.8)
NEUTROPHIL #: 3.7 10*3/uL (ref 1.85–7.80)
NEUTROPHIL %: 58 % (ref 43–77)
PLATELETS: 223 10*3/uL (ref 140–440)
RBC: 3.64 10*6/uL (ref 3.63–4.92)
RDW: 13.1 % (ref 12.3–17.7)
WBC: 6.3 10*3/uL (ref 3.8–11.8)

## 2023-04-28 LAB — COMPREHENSIVE METABOLIC PANEL, NON-FASTING
ALBUMIN/GLOBULIN RATIO: 1.5 — ABNORMAL HIGH (ref 0.8–1.4)
ALBUMIN: 2.7 g/dL — ABNORMAL LOW (ref 3.5–5.7)
ALKALINE PHOSPHATASE: 207 U/L — ABNORMAL HIGH (ref 34–104)
ALT (SGPT): 22 U/L (ref 7–52)
ANION GAP: 6 mmol/L (ref 4–13)
AST (SGOT): 47 U/L — ABNORMAL HIGH (ref 13–39)
BILIRUBIN TOTAL: 0.4 mg/dL (ref 0.3–1.0)
BUN/CREA RATIO: 15 (ref 6–22)
BUN: 28 mg/dL — ABNORMAL HIGH (ref 7–25)
CALCIUM, CORRECTED: 9.9 mg/dL (ref 8.9–10.8)
CALCIUM: 8.9 mg/dL (ref 8.6–10.3)
CHLORIDE: 116 mmol/L — ABNORMAL HIGH (ref 98–107)
CO2 TOTAL: 22 mmol/L (ref 21–31)
CREATININE: 1.88 mg/dL — ABNORMAL HIGH (ref 0.60–1.30)
ESTIMATED GFR: 27 mL/min/{1.73_m2} — ABNORMAL LOW (ref >59)
GLOBULIN: 1.8 — ABNORMAL LOW (ref 2.9–5.4)
GLUCOSE: 95 mg/dL (ref 74–109)
OSMOLALITY, CALCULATED: 292 mOsm/kg — ABNORMAL HIGH (ref 270–290)
POTASSIUM: 4.3 mmol/L (ref 3.5–5.1)
PROTEIN TOTAL: 4.5 g/dL — ABNORMAL LOW (ref 6.4–8.9)
SODIUM: 144 mmol/L (ref 136–145)

## 2023-04-28 LAB — SURGICAL PATHOLOGY SPECIMEN

## 2023-04-28 LAB — LAMOTRIGINE, SERUM: LAMOTRIGINE: 5.1 ug/mL (ref 2.5–15.0)

## 2023-04-28 LAB — MAGNESIUM: MAGNESIUM: 2.4 mg/dL (ref 1.9–2.7)

## 2023-04-28 MED ORDER — LACTATED RINGERS INTRAVENOUS SOLUTION
INTRAVENOUS | Status: AC
Start: 2023-04-28 — End: 2023-04-28
  Administered 2023-04-28: 1000 mL via INTRAVENOUS

## 2023-04-28 NOTE — Progress Notes (Signed)
[facility]  IP PROGRESS NOTE      Paula Cochran, Paula Cochran  Date of Admission:  04/24/2023  Date of Birth:  03/07/1946  Date of Service:  04/28/2023    Hospital Day:  LOS: 4 days       Subjective: Pt seen and examined for f/u esophageal thickening, cholelithiasis, bipolar disorder, generalized weakness, frequent falls. She is tolerating full liquids. States she would like to try solid food today.No n/v/d. No ab pain. No new complaints or overnight events.   Vital Signs:  Temp (24hrs) Max:36.9 C (98.4 F)      Temperature: 36.9 C (98.4 F)  BP (Non-Invasive): (!) 120/51  MAP (Non-Invasive): 71 mmHG  Heart Rate: 92  Respiratory Rate: 16  SpO2: 93 %    Current Medications:  acetaminophen (TYLENOL) tablet, 650 mg, Oral, Q4H PRN  albuterol 90 mcg per inhalation oral inhaler - "Respiratory to administer", 1 Puff, Inhalation, Q6H PRN  allopurinol (ZYLOPRIM) tablet, 100 mg, Oral, Daily  ARIPiprazole (ABILIFY) tablet, 5 mg, Oral, Daily  ARIPiprazole (ABILIFY) tablet, 10 mg, Oral, Once  docusate sodium (COLACE) capsule, 200 mg, Oral, Daily  famotidine (PEPCID) tablet, 40 mg, Oral, NIGHTLY  heparin 5,000 unit/mL injection, 5,000 Units, Subcutaneous, Q8HRS  lamoTRIgine (LaMICtal) tablet, 200 mg, Oral, Daily  LORazepam (ATIVAN) tablet 1.5 mg, 1.5 mg, Oral, NIGHTLY  LR premix infusion, , Intravenous, Continuous  NS flush syringe, 3 mL, Intracatheter, Q8HRS  NS flush syringe, 3 mL, Intracatheter, Q1H PRN  NS flush syringe, 3 mL, Intracatheter, Q8HRS  NS flush syringe, 3 mL, Intracatheter, Q1H PRN  nystatin (NYSTOP) 100,000 units/g topical powder, , Apply Topically, 2x/day  OLANZapine (zyPREXA) tablet, 2.5 mg, Oral, NIGHTLY  [START ON 04/29/2023] OLANZapine (zyPREXA) tablet, 5 mg, Oral, NIGHTLY  ondansetron (ZOFRAN) 2 mg/mL injection, 4 mg, Intravenous, Q6H PRN  pantoprazole (PROTONIX) delayed release tablet, 40 mg, Oral, 2x/day AC        Current Orders:  Active Orders   Lab    CBC/DIFF     Frequency: 0530 - AM DRAW      Number of Occurrences: 1 Occurrences    COMPREHENSIVE METABOLIC PANEL, NON-FASTING     Frequency: 0530 - AM DRAW     Number of Occurrences: 1 Occurrences    MAGNESIUM     Frequency: ONE TIME     Number of Occurrences: 1 Occurrences   Diet    DIET BLAND Do you want to initiate MNT Protocol? Yes     Frequency: All Meals     Number of Occurrences: 1 Occurrences   Nursing    ACTIVITY     Frequency: UNTIL DISCONTINUED     Number of Occurrences: Until Specified    CONTINUOUS CARDIAC MONITORING (ED USE ONLY)     Frequency: ONE TIME     Number of Occurrences: 1 Occurrences    FALL RISK - INITIATE  MOTION/BED/CHAIR SENSOR     Frequency: UNTIL DISCONTINUED     Number of Occurrences: Until Specified    INTAKE AND OUTPUT QSHIFT     Frequency: QSHIFT     Number of Occurrences: Until Specified    MD TO NURSE:  IF YOU ANTICIPATE DIFFICULTY OR ARE UNBLE TO OBTAIN CULTURES WITHIN 30 MIN OF THIS ORDER, PLEASE CONTACT THE ORDERING PROVIDER FOR FURTHER DIRECTION.  TIMELY ANTIBIOTIC ADMINISTRATION IS IMPERATIVE TO TREATING A PATIENT WITH SEPSIS.     Frequency: UNTIL DISCONTINUED     Number of Occurrences: Until Specified    MD TO NURSE: IF  UNABLE TO READILY OBTAIN ADEQUATE IV ACCESS WITHIN 15 MINUTES OF THIS ORDER, PLEASE NOTIFY PROVIDER IMMEDIATELY FOR FURTHER DIRECTION.     Frequency: UNTIL DISCONTINUED     Number of Occurrences: Until Specified    MISCELLANEOUS MD/DO TO NURSE     Frequency: UNTIL DISCONTINUED     Number of Occurrences: Until Specified     Order Comments: Please have unit social worker add a list of psychiatric providers in patient's area to discharge summary before discharge as daughter wants to switch. Thank you      Notify MD Vital Signs     Frequency: PRN     Number of Occurrences: Until Specified    PT IS INTERMEDIATE RISK FOR VENOUS THROMBOEMBOLISM     Frequency: CONTINUOUS     Number of Occurrences: Until Specified    PULSE OXIMETRY Q4H     Frequency: Q4H     Number of Occurrences: Until Specified    SOAP  SUDS ENEMA     Frequency: ONE TIME     Number of Occurrences: 1 Occurrences     Order Comments:         STRAIGHT CATH     Frequency: ONE TIME     Number of Occurrences: 1 Occurrences    TELEMETRY MONITORING - Continuous     Frequency: CONTINUOUS     Number of Occurrences: Until Specified    VITAL SIGNS  Q2H     Frequency: Q2H     Number of Occurrences: 250 Occurrences    VITAL SIGNS  Q4H     Frequency: Q4H     Number of Occurrences: Until Specified    WEIGH PATIENT     Frequency: UPON ADMISSION     Number of Occurrences: 1 Occurrences   Consult    IP CONSULT TO CARE MANAGEMENT     Frequency: ONE TIME     Number of Occurrences: 1 Occurrences    IP CONSULT TO GASTROENTEROLOGY On-Call Provider (nurse/clerk to determine)     Frequency: ONE TIME     Number of Occurrences: 1 Occurrences    IP CONSULT TO GENERAL SURGERY Requested Provider; DUREMDES, GENE B     Frequency: ONE TIME     Number of Occurrences: 1 Occurrences    IP CONSULT TO PSYCHIATRY On-Call Provider (nurse/clerk to determine)     Frequency: ONE TIME     Number of Occurrences: 1 Occurrences   Nourishments    DIETARY ORAL SUPPLEMENTS Oral Supplements with tray: Ensure Enlive-Vanilla; BREAKFAST/LUNCH/DINNER; 1 Each     Frequency: All Meals     Number of Occurrences: 1 Occurrences   OT    OT EVAL & TREAT     Frequency: Per Therapist Discretion     Number of Occurrences: 1 Occurrences     Scheduling Instructions:               PT    PT EVALUATE AND TREAT     Frequency: Per Therapist Discretion     Number of Occurrences: 1 Occurrences     Order Comments: If patient's O2 level drops, PT may increase O2 until sats are > 93%.       Scheduling Instructions:               Point of Care Testing    PERFORM POC WHOLE BLOOD GLUCOSE     Frequency: ONE TIME     Number of Occurrences: 1 Occurrences   IV    INSERT & MAINTAIN  PERIPHERAL IV ACCESS     Frequency: UNTIL DISCONTINUED     Number of Occurrences: Until Specified    PERIPHERAL IV DRESSING CHANGE     Frequency: PRN      Number of Occurrences: Until Specified   Precaution    ASPIRATION PRECAUTIONS     Frequency: UNTIL DISCONTINUED     Number of Occurrences: Until Specified   Case Request    CASE REQUEST SURGICAL: GASTROSCOPY     Frequency: ONE TIME     Number of Occurrences: 1 Occurrences   Medications    acetaminophen (TYLENOL) tablet     Frequency: Q4H PRN     Dose: 650 mg     Route: Oral    albuterol 90 mcg per inhalation oral inhaler - "Respiratory to administer"     Frequency: Q6H PRN     Dose: 1 Puff     Route: Inhalation    allopurinol (ZYLOPRIM) tablet     Frequency: Daily     Dose: 100 mg     Route: Oral    ARIPiprazole (ABILIFY) tablet     Frequency: Daily     Dose: 5 mg     Route: Oral    ARIPiprazole (ABILIFY) tablet     Frequency: Once     Dose: 10 mg     Route: Oral    docusate sodium (COLACE) capsule     Frequency: Daily     Dose: 200 mg     Route: Oral    famotidine (PEPCID) tablet     Frequency: NIGHTLY     Dose: 40 mg     Route: Oral    heparin 5,000 unit/mL injection     Frequency: Q8HRS     Dose: 5,000 Units     Route: Subcutaneous    lamoTRIgine (LaMICtal) tablet     Frequency: Daily     Dose: 200 mg     Route: Oral    LORazepam (ATIVAN) tablet 1.5 mg     Frequency: NIGHTLY     Dose: 1.5 mg     Route: Oral    LR premix infusion     Frequency: Continuous     Route: Intravenous    NS flush syringe     Frequency: Q8HRS     Dose: 3 mL     Route: Intracatheter    NS flush syringe     Frequency: Q1H PRN     Dose: 3 mL     Route: Intracatheter    NS flush syringe     Frequency: Q8HRS     Dose: 3 mL     Route: Intracatheter    NS flush syringe     Frequency: Q1H PRN     Dose: 3 mL     Route: Intracatheter    nystatin (NYSTOP) 100,000 units/g topical powder     Frequency: 2x/day     Route: Apply Topically    OLANZapine (zyPREXA) tablet     Frequency: NIGHTLY     Dose: 2.5 mg     Route: Oral    OLANZapine (zyPREXA) tablet     Frequency: NIGHTLY     Dose: 5 mg     Route: Oral    ondansetron (ZOFRAN) 2 mg/mL injection      Frequency: Q6H PRN     Dose: 4 mg     Route: Intravenous    pantoprazole (PROTONIX) delayed release tablet     Frequency: 2x/day AC  Dose: 40 mg     Route: Oral        Review of Systems:  Focused review of system was completed. Refer to the HPI for ROS details.     Today's Physical Exam:  Physical Exam  Constitutional:       General: She is not in acute distress.  Cardiovascular:      Rate and Rhythm: Normal rate and regular rhythm.      Heart sounds: No murmur heard.  Pulmonary:      Effort: Pulmonary effort is normal. No respiratory distress.      Breath sounds: No wheezing or rales.   Abdominal:      General: Bowel sounds are normal. There is no distension.      Palpations: Abdomen is soft.      Tenderness: There is no abdominal tenderness. There is no guarding.   Musculoskeletal:      Right lower leg: No edema.      Left lower leg: No edema.   Skin:     General: Skin is warm and dry.   Neurological:      General: No focal deficit present.      Mental Status: She is alert and oriented to person, place, and time.   Psychiatric:      Comments: Pleasant affect, smiling              I/O:  I/O last 24 hours:    Intake/Output Summary (Last 24 hours) at 04/28/2023 1317  Last data filed at 04/28/2023 0500  Gross per 24 hour   Intake 340 ml   Output 1 ml   Net 339 ml     I/O current shift:  No intake/output data recorded.      Labs  Please indicate ordered or reviewed)  Reviewed: I have reviewed all lab results.  Lab Results Today:    Results for orders placed or performed during the hospital encounter of 04/24/23 (from the past 24 hour(s))   MAGNESIUM   Result Value Ref Range    MAGNESIUM 2.4 1.9 - 2.7 mg/dL   COMPREHENSIVE METABOLIC PANEL, NON-FASTING   Result Value Ref Range    SODIUM 144 136 - 145 mmol/L    POTASSIUM 4.3 3.5 - 5.1 mmol/L    CHLORIDE 116 (H) 98 - 107 mmol/L    CO2 TOTAL 22 21 - 31 mmol/L    ANION GAP 6 4 - 13 mmol/L    BUN 28 (H) 7 - 25 mg/dL    CREATININE 0.98 (H) 0.60 - 1.30 mg/dL    BUN/CREA RATIO  15 6 - 22    ESTIMATED GFR 27 (L) >59 mL/min/1.30m^2    ALBUMIN 2.7 (L) 3.5 - 5.7 g/dL    CALCIUM 8.9 8.6 - 11.9 mg/dL    GLUCOSE 95 74 - 147 mg/dL    ALKALINE PHOSPHATASE 207 (H) 34 - 104 U/L    ALT (SGPT) 22 7 - 52 U/L    AST (SGOT) 47 (H) 13 - 39 U/L    BILIRUBIN TOTAL 0.4 0.3 - 1.0 mg/dL    PROTEIN TOTAL 4.5 (L) 6.4 - 8.9 g/dL    ALBUMIN/GLOBULIN RATIO 1.5 (H) 0.8 - 1.4    OSMOLALITY, CALCULATED 292 (H) 270 - 290 mOsm/kg    CALCIUM, CORRECTED 9.9 8.9 - 10.8 mg/dL    GLOBULIN 1.8 (L) 2.9 - 5.4   CBC WITH DIFF   Result Value Ref Range    WBC 6.3 3.8 - 11.8 x10^3/uL  RBC 3.64 3.63 - 4.92 x10^6/uL    HGB 12.1 10.9 - 14.3 g/dL    HCT 44.0 34.7 - 42.5 %    MCV 98.3 (H) 75.5 - 95.3 fL    MCH 33.2 (H) 24.7 - 32.8 pg    MCHC 33.8 32.3 - 35.6 g/dL    RDW 95.6 38.7 - 56.4 %    PLATELETS 223 140 - 440 x10^3/uL    MPV 9.6 7.9 - 10.8 fL    NEUTROPHIL % 58 43 - 77 %    LYMPHOCYTE % 30 16 - 44 %    MONOCYTE % 7 5 - 13 %    EOSINOPHIL % 4 1 - 7 %    BASOPHIL % 0 0 - 1 %    NEUTROPHIL # 3.70 1.85 - 7.80 x10^3/uL    LYMPHOCYTE # 1.90 1.00 - 3.00 x10^3/uL    MONOCYTE # 0.50 0.30 - 1.00 x10^3/uL    EOSINOPHIL # 0.20 0.00 - 0.50 x10^3/uL    BASOPHIL # 0.00 0.00 - 0.10 x10^3/uL           Problem List:  Active Hospital Problems   (*Primary Problem)    Diagnosis    *Weakness generalized    Acute renal failure 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 Continue bid PPI and qhs Pepcid. Was evaluated by psychiatry service and Abilify will be tapered (decreased to 5mg  p daily x 4 days then DC ) and pt to  begin olanzapine 2.5mg  po qhs x 4 days then increase to 5mg  po qhs. Continue lamotrigine 200 mg po daily. PT eval pending as she was too ill to participate yesterday. Likely will still need rehab at DC.      04/27/23-diet advanced to full liquids. OOB to chair today and resume P.T. in am. Continue meds as above. Case management to resume DC planning upon return in am.    04/28/23-will advance diet to bland and observe. EGD bx pending. Pt on ivf for AKI, likely due to dehydration from GI loss. Abilify should taper off completely tomorrow and Zyprexa dose will increase to 5mg  tomorrow. Pt remains on lamictal. Worked with PT today and walked 30 feet. Awaiting rehab. Appears medically stable when facility is found.    The Hospitalist personally evaluated and examined the patient in conjunction with the MLP and agree with the assessments, treatment plan and disposition of the patient as recorded by the [facility].      Laurey Morale, MSPA, [facility]  [facility]    DVT/PE Prophylaxis: SCDs/ Venodynes/Impulse boots

## 2023-04-28 NOTE — Care Management Notes (Signed)
Case Details  CASE ID CASE CONTRACT SUBMITTED ON COMPLETED ON REASON OUTCOME   [id number] [place] [facility] 04/28/2023 12:44:51 PM  L1 Level 1 Approved - Pending Review

## 2023-04-28 NOTE — Care Management Notes (Signed)
Case Details  CASE ID CASE CONTRACT SUBMITTED ON COMPLETED ON REASON OUTCOME   [id number] [place] [facility] 04/28/2023 12:44:51 PM 04/28/2023 1:38:21 PM L1 Level 1 Approved - Level 2 Not Required   [place] [facility] APPLICATION

## 2023-04-28 NOTE — Progress Notes (Signed)
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   Will plan for repeat EGD with biopsies in approximately 6 weeks after ulcers heal.     Rogers Seeds, MD

## 2023-04-28 NOTE — Care Plan (Signed)
Problem: Adult Inpatient Plan of Care  Goal: Plan of Care Review  Outcome: Ongoing (see interventions/notes)  Goal: Absence of Hospital-Acquired Illness or Injury  Outcome: Ongoing (see interventions/notes)  Intervention: Identify and Manage Fall Risk  Recent Flowsheet Documentation  Taken 04/27/2023 2055 by Weber Cooks, RN  Safety Promotion/Fall Prevention:   activity supervised   fall prevention program maintained   muscle strengthening facilitated   nonskid shoes/slippers when out of bed   safety round/check completed  Intervention: Prevent Skin Injury  Recent Flowsheet Documentation  Taken 04/27/2023 2055 by Weber Cooks, RN  Body Position:   supine, head elevated   side lying, right  Skin Protection:   adhesive use limited   incontinence pads utilized   transparent dressing maintained  Intervention: Prevent and Manage VTE (Venous Thromboembolism) Risk  Recent Flowsheet Documentation  Taken 04/27/2023 2055 by Weber Cooks, RN  VTE Prevention/Management: anticoagulant therapy maintained  Intervention: Prevent Infection  Recent Flowsheet Documentation  Taken 04/27/2023 2055 by Weber Cooks, RN  Infection Prevention:   promote handwashing   personal protective equipment utilized   equipment surfaces disinfected  Goal: Optimal Comfort and Wellbeing  Outcome: Ongoing (see interventions/notes)  Goal: Rounds/Family Conference  Outcome: Ongoing (see interventions/notes)     Problem: Health Knowledge, Opportunity to Enhance (Adult,Obstetrics,Pediatric)  Goal: Knowledgeable about Health Subject/Topic  Description: Patient will demonstrate the desired outcomes by discharge/transition of care.  Outcome: Ongoing (see interventions/notes)     Problem: Skin Injury Risk Increased  Goal: Skin Health and Integrity  Outcome: Ongoing (see interventions/notes)  Intervention: Optimize Skin Protection  Recent Flowsheet Documentation  Taken 04/27/2023 2055 by Weber Cooks, RN  Pressure Reduction Techniques:   Heels elevated off of the bed   Patient  turned q 2 hours   Frequent weight shifting encouraged  Pressure Reduction Devices:   Heel offloading device utilized   Repositioning wedges/pillows utilized  Skin Protection:   adhesive use limited   incontinence pads utilized   transparent dressing maintained  Head of Bed (HOB) Positioning: HOB at 30-45 degrees     Problem: Fall Injury Risk  Goal: Absence of Fall and Fall-Related Injury  Outcome: Ongoing (see interventions/notes)  Intervention: Promote Injury-Free Environment  Recent Flowsheet Documentation  Taken 04/27/2023 2055 by Weber Cooks, RN  Safety Promotion/Fall Prevention:   activity supervised   fall prevention program maintained   muscle strengthening facilitated   nonskid shoes/slippers when out of bed   safety round/check completed

## 2023-04-28 NOTE — PT Treatment (Signed)
[facility]  [address]  [place], [address]  [phone]  (Fax) [phone]  Rehabilitation Department  Physical Therapy Daily Inpatient Note    Date: 04/28/2023  Patient's Name: Paula Cochran  Date of Birth: 07-Dec-1945  Height: Height: 157.5 cm (5\' 2" )  Weight: Weight: 82.5 kg (181 lb 14.1 oz)      Plan: Will continue under current POC.      Discharge Disposition: inpatient rehabilitation facility, skilled nursing facility        Subjective/Objective/Assessment:  Flowsheet    04/28/23 1001   Rehab Session   Document Type therapy progress note (daily note)   PT Visit Date 04/28/23   Total PT Minutes: 10   Patient Effort good   General Information   Patient Profile Reviewed yes   Medical Lines PIV Line;Telemetry   Respiratory Status room air   Pre Treatment Status   Pre Treatment Patient Status Patient supine in bed;Patient safety alarm activated;Nurse approved session   Support Present Pre Treatment  Family present   Cognitive Assessment/Interventions   Behavior/Mood Observations behavior appropriate to situation, WNL/WFL   Vital Signs   Pre-Treatment Heart Rate (beats/min) 109   Post-treatment Heart Rate (beats/min) 109   Pre SpO2 (%) 92   O2 Delivery Pre Treatment room air   Post SpO2 (%) 91   O2 Delivery Post Treatment room air   Pain Assessment   Pretreatment Pain Rating 5/10   Posttreatment Pain Rating 5/10   Pre/Posttreatment Pain Comment Both knees   Bed Mobility Assessment/Treatment   Supine-Sit Independence minimum assist (75% patient effort)   Sit to Supine, Independence minimum assist (75% patient effort)   Transfer Assessment/Treatment   Sit-Stand Independence minimum assist (75% patient effort)   Stand-Sit Independence contact guard assist   Sit-Stand-Sit, Assist Device walker, front wheeled   Gait Assessment/Treatment   Total Distance Ambulated 30   Independence  minimum assist (75% patient effort);1 person + 1 person to manage equipment   Assistive  Device  walker, front wheeled   Balance   Sitting Balance: Static fair + balance   Sitting, Dynamic (Balance) fair + balance   Sit-to-Stand Balance fair - balance   Standing Balance: Static fair - balance   Standing Balance: Dynamic fair - balance   Post Treatment Status   Post Treatment Patient Status Patient supine in bed;Call light within reach;Patient safety alarm activated   Support Present Post Treatment  Family present   Physical Therapy Clinical Impression   Assessment Patient requires min assist from supine to sitting edge of bed. She stands with CGA and gaits with RW and min assist. Patients lower extremities are weak and do become shaky on return to bed. She returns to supine with min assist. Bed check activated and needs in reach.   Anticipated Discharge Disposition inpatient rehabilitation facility;skilled nursing facility                 Intervention minutes: GAIT TRAINING 10 MINUTES    THERAPIST  Memory Dance, PTA  04/28/2023, 11:43

## 2023-04-28 NOTE — OT Treatment (Signed)
[facility]  [address]  [place], [address]  [phone]  (Fax) [phone]  Rehabilitation Department     Occupational Therapy Daily Inpatient Note    Date: 04/28/2023  Patient's Name: Paula Cochran  Date of Birth: 07-09-1946  Height: Height: 157.5 cm (5\' 2" )  Weight: Weight: 82.5 kg (181 lb 14.1 oz)        Plan: Will continue under current POC.         Subjective/Objective/Assessment:      04/28/23 1515   Rehab Session   Document Type therapy progress note (daily note)   OT Visit Date 04/28/23   Total OT Minutes: 18   Patient Effort adequate   Symptoms Noted During/After Treatment none   General Information   Patient Profile Reviewed yes   Medical Lines Telemetry;PIV Line   Respiratory Status room air   Existing Precautions/Restrictions aspiration precautions;fall precautions   Pre Treatment Status   Pre Treatment Patient Status Patient supine in bed;Call light within reach;Patient safety alarm activated;Nurse approved session   Support Present Pre Treatment  None   Communication Pre Treatment  Charge Nurse   Communication Pre Treatment Comment Clear for OT   Vital Signs   Pre-Treatment Heart Rate (beats/min) 93   Post-treatment Heart Rate (beats/min) 95   Pre SpO2 (%) 93   Post SpO2 (%) 93   Pain Assessment   Pretreatment Pain Rating 0/10 - no pain   Posttreatment Pain Rating 0/10 - no pain   Cognitive Assessment/Interventions   Behavior/Mood Observations behavior appropriate to situation, WNL/WFL   Bed Mobility Assessment/Treatment   Supine-Sit Independence moderate assist (50% patient effort)   Sit to Supine, Independence moderate assist (50% patient effort)   Transfer Assessment/Treatment   Sit-Stand Independence minimum assist (75% patient effort)   Stand-Sit Independence minimum assist (75% patient effort)   Toilet Transfer Independence minimum assist (75% patient effort)   Post Treatment Status   Post Treatment Patient Status Patient supine in bed;Call light  within reach;Patient safety alarm activated   Support Present Post Treatment  None   Clinical Impression   Functional Level at Time of Session Patient completed supine to sit EOB with mod A. patient completed sit to stand and transfer to bedside commode with CGA and minA. patient completed toileting with max A for moving gown and perineal hygiene. patient completed sit stand and transfer back to bed with min A and CGA. patient completed sit to supine with mod A.oatient brushed hair with set-up. patient left in bed with needs within rech and safety alarm on.                 Intervention minutes: ADL/IADL TRAINING 18 minutes      THERAPIST  Christ Kick, COTA  04/28/2023, 16:51

## 2023-04-29 DIAGNOSIS — Z751 Person awaiting admission to adequate facility elsewhere: Secondary | ICD-10-CM

## 2023-04-29 DIAGNOSIS — K279 Peptic ulcer, site unspecified, unspecified as acute or chronic, without hemorrhage or perforation: Secondary | ICD-10-CM

## 2023-04-29 DIAGNOSIS — N179 Acute kidney failure, unspecified: Secondary | ICD-10-CM

## 2023-04-29 DIAGNOSIS — F319 Bipolar disorder, unspecified: Secondary | ICD-10-CM

## 2023-04-29 DIAGNOSIS — R531 Weakness: Secondary | ICD-10-CM

## 2023-04-29 DIAGNOSIS — K7460 Unspecified cirrhosis of liver: Secondary | ICD-10-CM

## 2023-04-29 DIAGNOSIS — R1310 Dysphagia, unspecified: Secondary | ICD-10-CM

## 2023-04-29 DIAGNOSIS — N184 Chronic kidney disease, stage 4 (severe): Secondary | ICD-10-CM

## 2023-04-29 DIAGNOSIS — R296 Repeated falls: Secondary | ICD-10-CM

## 2023-04-29 LAB — COMPREHENSIVE METABOLIC PANEL, NON-FASTING
ALBUMIN/GLOBULIN RATIO: 1.4 (ref 0.8–1.4)
ALBUMIN: 2.6 g/dL — ABNORMAL LOW (ref 3.5–5.7)
ALKALINE PHOSPHATASE: 203 U/L — ABNORMAL HIGH (ref 34–104)
ALT (SGPT): 23 U/L (ref 7–52)
ANION GAP: 7 mmol/L (ref 4–13)
AST (SGOT): 48 U/L — ABNORMAL HIGH (ref 13–39)
BILIRUBIN TOTAL: 0.5 mg/dL (ref 0.3–1.0)
BUN/CREA RATIO: 15 (ref 6–22)
BUN: 28 mg/dL — ABNORMAL HIGH (ref 7–25)
CALCIUM, CORRECTED: 9.9 mg/dL (ref 8.9–10.8)
CALCIUM: 8.8 mg/dL (ref 8.6–10.3)
CHLORIDE: 112 mmol/L — ABNORMAL HIGH (ref 98–107)
CO2 TOTAL: 22 mmol/L (ref 21–31)
CREATININE: 1.84 mg/dL — ABNORMAL HIGH (ref 0.60–1.30)
ESTIMATED GFR: 28 mL/min/{1.73_m2} — ABNORMAL LOW (ref >59)
GLOBULIN: 1.8 — ABNORMAL LOW (ref 2.9–5.4)
GLUCOSE: 99 mg/dL (ref 74–109)
OSMOLALITY, CALCULATED: 287 mOsm/kg (ref 270–290)
POTASSIUM: 4.8 mmol/L (ref 3.5–5.1)
PROTEIN TOTAL: 4.4 g/dL — ABNORMAL LOW (ref 6.4–8.9)
SODIUM: 141 mmol/L (ref 136–145)

## 2023-04-29 LAB — MAGNESIUM: MAGNESIUM: 2.4 mg/dL (ref 1.9–2.7)

## 2023-04-29 LAB — CBC WITH DIFF
BASOPHIL #: 0 10*3/uL (ref 0.00–0.10)
BASOPHIL %: 0 % (ref 0–1)
EOSINOPHIL #: 0.3 10*3/uL (ref 0.00–0.50)
EOSINOPHIL %: 4 % (ref 1–7)
HCT: 33.6 % (ref 31.2–41.9)
HGB: 11.5 g/dL (ref 10.9–14.3)
LYMPHOCYTE #: 2.4 10*3/uL (ref 1.00–3.00)
LYMPHOCYTE %: 33 % (ref 16–44)
MCH: 33.7 pg — ABNORMAL HIGH (ref 24.7–32.8)
MCHC: 34.1 g/dL (ref 32.3–35.6)
MCV: 98.7 fL — ABNORMAL HIGH (ref 75.5–95.3)
MONOCYTE #: 0.6 10*3/uL (ref 0.30–1.00)
MONOCYTE %: 8 % (ref 5–13)
MPV: 9.4 fL (ref 7.9–10.8)
NEUTROPHIL #: 4.1 10*3/uL (ref 1.85–7.80)
NEUTROPHIL %: 55 % (ref 43–77)
PLATELETS: 213 10*3/uL (ref 140–440)
RBC: 3.41 10*6/uL — ABNORMAL LOW (ref 3.63–4.92)
RDW: 13.3 % (ref 12.3–17.7)
WBC: 7.4 10*3/uL (ref 3.8–11.8)

## 2023-04-29 LAB — ADULT ROUTINE BLOOD CULTURE, SET OF 2 BOTTLES (BACTERIA AND YEAST)
BLOOD CULTURE, ROUTINE: NO GROWTH
BLOOD CULTURE, ROUTINE: NO GROWTH

## 2023-04-29 NOTE — Nurses Notes (Signed)
[facility]  [address]  [place], [address]  [phone]  (Fax) [phone]  Rehabilitation Department  Physical Therapy    Restorative Aide Note    Date: 04/29/2023  Patient's Name: Paula Cochran  Date of Birth: 11-Aug-1945  Height: Height: 157.5 cm (5\' 2" )  Weight: Weight: 82.5 kg (181 lb 14.1 oz)      Total minutes: 10    Patient effort: good     Patient profile reviewed: yes    Medical Lines: TELEMETRY    Respiratory Status:ROOM AIR    Existing precautions/restrictions:FALL PRECAUTIONS    Pre-treatment status: PATIENT SUPINE IN BED    Communication pre-treatment: CHARGE NURSE    Attention/Behavior: WNL/WFL    Pre-treatment HR: 80    Post-treatment HR: 84    PreSpO2: 92    O2 delivery pre-treatment: n/a    Post SpO2: 94    Pre-treatment pain rating: 0/10    Post-treatment pain rating: 0/10    Location of pain: n/a    Bed mobility (roll, bridge, scoot, supine to sit): patient sitting on eob  Sitting assistance:  SBA/CGA    Sit to stand: SBA/CGA    Gait: ambulated patient 25 feet with wheeled , walker and one assist/patient declined to walk any further    Exercise: self on exercises, 10 ankle pumps, 10 short arc quads,     Post treatment status: IN VASCULAR CHAIR, NEEDS IN REACH, BED ALARMED          Annamaria Helling, PAT CARE [facility]  04/29/2023, 12:04

## 2023-04-29 NOTE — OT Treatment (Signed)
[facility]  [address]  [place], [address]  [phone]  (Fax) [phone]  Rehabilitation Department     Occupational Therapy Daily Inpatient Note    Date: 04/29/2023  Patient's Name: Paula Cochran  Date of Birth: 1946-05-13  Height: Height: 157.5 cm (5\' 2" )  Weight: Weight: 82.5 kg (181 lb 14.1 oz)        Plan: Will continue under current POC.         Subjective/Objective/Assessment:      04/29/23 0945   Rehab Session   Document Type therapy progress note (daily note)   OT Visit Date 04/29/23   Total OT Minutes: 28   Patient Effort good   General Information   Patient Profile Reviewed yes   Medical Lines PIV Line;Telemetry   Respiratory Status room air   Existing Precautions/Restrictions aspiration precautions;fall precautions   Pre Treatment Status   Pre Treatment Patient Status Patient supine in bed;Call light within reach;Telephone within reach;Patient safety alarm activated;Nurse approved session   Support Present Pre Treatment  Family present   Communication Pre Treatment  Charge Nurse   Communication Pre Treatment Comment cleared for OT   Vital Signs   Pre-Treatment Heart Rate (beats/min) 92   Post-treatment Heart Rate (beats/min) 96   Pre SpO2 (%) 97   O2 Delivery Pre Treatment room air   Post SpO2 (%) 94   O2 Delivery Post Treatment room air   Coping/Psychosocial   Observed Emotional State calm;cooperative   Verbalized Emotional State acceptance   Cognitive Assessment/Interventions   Behavior/Mood Observations behavior appropriate to situation, WNL/WFL   Bed Mobility Assessment/Treatment   Bed Mobility, Assistive Device bed rails   Supine-Sit Independence contact guard assist   Sit to Supine, Independence contact guard assist   Bathing Assessment/Training   Position sitting   BATHING ASSESSED Entire body   ASSISTANCE REQUIRED Arm right;Arm left;Abdomen;Chest;Perineal area;Upper leg right;Upper leg left;Lower leg/foot right;Lower leg/foot left   Independence  Level minimum assist (75% patient effort)   Upper Body Dressing Assessment/Training   Position  sitting   DRESSING ASSESSED Don Shirt-pull over;Doff Shirt- pull over   ASSISTANCE REQUIRED DONNING Arm right;Arm left;Pull over head;Pull over trunk;Pull around shoulders   ASSISTANCE REQUIRED DOFFING Arm right;Arm left;Reomve over head;Remove from trunk;Remove around shoulders   Independence Level  minimum assist (75% patient effort)   Lower Body Dressing Assessment/Training   DRESSING ASSESSED Sheldon Silvan;Doff Socks   ASSISTANCE REQUIRED DONNING Right sock;Left sock   ASSITANCE REQUIRED DOFFING  Left sock;Right sock   Independence Level  contact guard assist   Post Treatment Status   Post Treatment Patient Status Patient supine in bed;Call light within reach;Telephone within reach;Patient safety alarm activated   Support Present Post Treatment  None   Clinical Impression   Functional Level at Time of Session Patient supine in bed. Patient able to get fro supine to EOB with use of bed rails with CGA. Patient completed bathing with Min A for LB bathing. Patient able to don/doff gown with CGA for buttons on gown to button. Patient able to don/doff socks with Min A to get socks back on. Patient completed grooming sitting EOB with Mod I. Patient returned to supine with needs within reach and bed alarm activated.                 Intervention minutes: ADL/IADL TRAINING 28 minutes      THERAPIST  Coletta Memos, COTA  04/29/2023, 12:52

## 2023-04-29 NOTE — Progress Notes (Signed)
[facility]               IP PROGRESS NOTE      Izumi, Cuadrado  Date of Admission:  04/24/2023  Date of Birth:  01-24-1946  Date of Service:  04/29/2023    Hospital Day:  LOS: 5 days     Subjective:   Paula Cochran is a 77 y/o female who is being seen in f/u for esophageal thickening, cholelithiasis, bipolar disorder, generalized weakness, frequent falls.  She has gotten up with PT who recommends SNF. Patient in agreement. No acute concerns voiced. No overnight events. Husband at bedside.       Vital Signs:  Temp (24hrs) Max:36.8 C (98.2 F)      Temperature: 36.5 C (97.7 F)  BP (Non-Invasive): (!) 115/43  MAP (Non-Invasive): (!) 59 mmHG  Heart Rate: 84  Respiratory Rate: 16  SpO2: 97 %    Current Medications:  acetaminophen (TYLENOL) tablet, 650 mg, Oral, Q4H PRN  albuterol 90 mcg per inhalation oral inhaler - "Respiratory to administer", 1 Puff, Inhalation, Q6H PRN  allopurinol (ZYLOPRIM) tablet, 100 mg, Oral, Daily  ARIPiprazole (ABILIFY) tablet, 10 mg, Oral, Once  docusate sodium (COLACE) capsule, 200 mg, Oral, Daily  famotidine (PEPCID) tablet, 40 mg, Oral, NIGHTLY  heparin 5,000 unit/mL injection, 5,000 Units, Subcutaneous, Q8HRS  lamoTRIgine (LaMICtal) tablet, 200 mg, Oral, Daily  LORazepam (ATIVAN) tablet 1.5 mg, 1.5 mg, Oral, NIGHTLY  NS flush syringe, 3 mL, Intracatheter, Q8HRS  NS flush syringe, 3 mL, Intracatheter, Q1H PRN  NS flush syringe, 3 mL, Intracatheter, Q8HRS  NS flush syringe, 3 mL, Intracatheter, Q1H PRN  nystatin (NYSTOP) 100,000 units/g topical powder, , Apply Topically, 2x/day  OLANZapine (zyPREXA) tablet, 5 mg, Oral, NIGHTLY  ondansetron (ZOFRAN) 2 mg/mL injection, 4 mg, Intravenous, Q6H PRN  pantoprazole (PROTONIX) delayed release tablet, 40 mg, Oral, 2x/day AC        Current Orders:  Active Orders   Lab    CBC     Frequency: 0530 - AM DRAW     Number of Occurrences: 1 Occurrences    COMPREHENSIVE METABOLIC PANEL, NON-FASTING     Frequency: 0530 - AM DRAW     Number  of Occurrences: 1 Occurrences    MAGNESIUM - AM ONCE     Frequency: 0530 - AM DRAW     Number of Occurrences: 1 Occurrences   Diet    DIET BLAND Do you want to initiate MNT Protocol? Yes     Frequency: All Meals     Number of Occurrences: 1 Occurrences   Nursing    ACTIVITY     Frequency: UNTIL DISCONTINUED     Number of Occurrences: Until Specified    CONTINUOUS CARDIAC MONITORING (ED USE ONLY)     Frequency: ONE TIME     Number of Occurrences: 1 Occurrences    FALL RISK - INITIATE  MOTION/BED/CHAIR SENSOR     Frequency: UNTIL DISCONTINUED     Number of Occurrences: Until Specified    INTAKE AND OUTPUT QSHIFT     Frequency: QSHIFT     Number of Occurrences: Until Specified    MD TO NURSE:  IF YOU ANTICIPATE DIFFICULTY OR ARE UNBLE TO OBTAIN CULTURES WITHIN 30 MIN OF THIS ORDER, PLEASE CONTACT THE ORDERING PROVIDER FOR FURTHER DIRECTION.  TIMELY ANTIBIOTIC ADMINISTRATION IS IMPERATIVE TO TREATING A PATIENT WITH SEPSIS.     Frequency: UNTIL DISCONTINUED     Number of Occurrences: Until Specified  MD TO NURSE: IF UNABLE TO READILY OBTAIN ADEQUATE IV ACCESS WITHIN 15 MINUTES OF THIS ORDER, PLEASE NOTIFY PROVIDER IMMEDIATELY FOR FURTHER DIRECTION.     Frequency: UNTIL DISCONTINUED     Number of Occurrences: Until Specified    MISCELLANEOUS MD/DO TO NURSE     Frequency: UNTIL DISCONTINUED     Number of Occurrences: Until Specified     Order Comments: Please have unit social worker add a list of psychiatric providers in patient's area to discharge summary before discharge as daughter wants to switch. Thank you      Notify MD Vital Signs     Frequency: PRN     Number of Occurrences: Until Specified    PT IS INTERMEDIATE RISK FOR VENOUS THROMBOEMBOLISM     Frequency: CONTINUOUS     Number of Occurrences: Until Specified    PULSE OXIMETRY Q4H     Frequency: Q4H     Number of Occurrences: Until Specified    SOAP SUDS ENEMA     Frequency: ONE TIME     Number of Occurrences: 1 Occurrences     Order Comments:         STRAIGHT  CATH     Frequency: ONE TIME     Number of Occurrences: 1 Occurrences    TELEMETRY MONITORING - Continuous     Frequency: CONTINUOUS     Number of Occurrences: Until Specified    VITAL SIGNS  Q2H     Frequency: Q2H     Number of Occurrences: 250 Occurrences    VITAL SIGNS  Q4H     Frequency: Q4H     Number of Occurrences: Until Specified    WEIGH PATIENT     Frequency: UPON ADMISSION     Number of Occurrences: 1 Occurrences   Consult    IP CONSULT TO CARE MANAGEMENT     Frequency: ONE TIME     Number of Occurrences: 1 Occurrences    IP CONSULT TO GASTROENTEROLOGY On-Call Provider (nurse/clerk to determine)     Frequency: ONE TIME     Number of Occurrences: 1 Occurrences    IP CONSULT TO GENERAL SURGERY Requested Provider; DUREMDES, GENE B     Frequency: ONE TIME     Number of Occurrences: 1 Occurrences    IP CONSULT TO PSYCHIATRY On-Call Provider (nurse/clerk to determine)     Frequency: ONE TIME     Number of Occurrences: 1 Occurrences   Nourishments    DIETARY ORAL SUPPLEMENTS Oral Supplements with tray: Ensure Enlive-Vanilla; BREAKFAST/LUNCH/DINNER; 1 Each     Frequency: All Meals     Number of Occurrences: 1 Occurrences   OT    OT EVAL & TREAT     Frequency: Per Therapist Discretion     Number of Occurrences: 1 Occurrences     Scheduling Instructions:               PT    PT EVALUATE AND TREAT     Frequency: Per Therapist Discretion     Number of Occurrences: 1 Occurrences     Order Comments: If patient's O2 level drops, PT may increase O2 until sats are > 93%.       Scheduling Instructions:               Point of Care Testing    PERFORM POC WHOLE BLOOD GLUCOSE     Frequency: ONE TIME     Number of Occurrences: 1 Occurrences   IV  INSERT & MAINTAIN PERIPHERAL IV ACCESS     Frequency: UNTIL DISCONTINUED     Number of Occurrences: Until Specified    PERIPHERAL IV DRESSING CHANGE     Frequency: PRN     Number of Occurrences: Until Specified   Precaution    ASPIRATION PRECAUTIONS     Frequency: UNTIL DISCONTINUED      Number of Occurrences: Until Specified   Case Request    CASE REQUEST SURGICAL: GASTROSCOPY     Frequency: ONE TIME     Number of Occurrences: 1 Occurrences   Medications    acetaminophen (TYLENOL) tablet     Frequency: Q4H PRN     Dose: 650 mg     Route: Oral    albuterol 90 mcg per inhalation oral inhaler - "Respiratory to administer"     Frequency: Q6H PRN     Dose: 1 Puff     Route: Inhalation    allopurinol (ZYLOPRIM) tablet     Frequency: Daily     Dose: 100 mg     Route: Oral    ARIPiprazole (ABILIFY) tablet     Frequency: Once     Dose: 10 mg     Route: Oral    docusate sodium (COLACE) capsule     Frequency: Daily     Dose: 200 mg     Route: Oral    famotidine (PEPCID) tablet     Frequency: NIGHTLY     Dose: 40 mg     Route: Oral    heparin 5,000 unit/mL injection     Frequency: Q8HRS     Dose: 5,000 Units     Route: Subcutaneous    lamoTRIgine (LaMICtal) tablet     Frequency: Daily     Dose: 200 mg     Route: Oral    LORazepam (ATIVAN) tablet 1.5 mg     Frequency: NIGHTLY     Dose: 1.5 mg     Route: Oral    NS flush syringe     Frequency: Q8HRS     Dose: 3 mL     Route: Intracatheter    NS flush syringe     Frequency: Q1H PRN     Dose: 3 mL     Route: Intracatheter    NS flush syringe     Frequency: Q8HRS     Dose: 3 mL     Route: Intracatheter    NS flush syringe     Frequency: Q1H PRN     Dose: 3 mL     Route: Intracatheter    nystatin (NYSTOP) 100,000 units/g topical powder     Frequency: 2x/day     Route: Apply Topically    OLANZapine (zyPREXA) tablet     Frequency: NIGHTLY     Dose: 5 mg     Route: Oral    ondansetron (ZOFRAN) 2 mg/mL injection     Frequency: Q6H PRN     Dose: 4 mg     Route: Intravenous    pantoprazole (PROTONIX) delayed release tablet     Frequency: 2x/day AC     Dose: 40 mg     Route: Oral        Review of Systems:  Focused review of system was completed. Refer to the HPI for ROS details.     Today's Physical Exam:  Physical Exam   I/O:  I/O last 24 hours:    Intake/Output  Summary (Last 24 hours) at 04/29/2023 1232  Last data  filed at 04/29/2023 0600  Gross per 24 hour   Intake 1660 ml   Output --   Net 1660 ml     I/O current shift:  No intake/output data recorded.  Labs:  Reviewed: I have reviewed all lab results.  Lab Results Today:    Results for orders placed or performed during the hospital encounter of 04/24/23 (from the past 24 hour(s))   COMPREHENSIVE METABOLIC PANEL, NON-FASTING   Result Value Ref Range    SODIUM 141 136 - 145 mmol/L    POTASSIUM 4.8 3.5 - 5.1 mmol/L    CHLORIDE 112 (H) 98 - 107 mmol/L    CO2 TOTAL 22 21 - 31 mmol/L    ANION GAP 7 4 - 13 mmol/L    BUN 28 (H) 7 - 25 mg/dL    CREATININE 0.27 (H) 0.60 - 1.30 mg/dL    BUN/CREA RATIO 15 6 - 22    ESTIMATED GFR 28 (L) >59 mL/min/1.53m^2    ALBUMIN 2.6 (L) 3.5 - 5.7 g/dL    CALCIUM 8.8 8.6 - 25.3 mg/dL    GLUCOSE 99 74 - 664 mg/dL    ALKALINE PHOSPHATASE 203 (H) 34 - 104 U/L    ALT (SGPT) 23 7 - 52 U/L    AST (SGOT) 48 (H) 13 - 39 U/L    BILIRUBIN TOTAL 0.5 0.3 - 1.0 mg/dL    PROTEIN TOTAL 4.4 (L) 6.4 - 8.9 g/dL    ALBUMIN/GLOBULIN RATIO 1.4 0.8 - 1.4    OSMOLALITY, CALCULATED 287 270 - 290 mOsm/kg    CALCIUM, CORRECTED 9.9 8.9 - 10.8 mg/dL    GLOBULIN 1.8 (L) 2.9 - 5.4   CBC WITH DIFF   Result Value Ref Range    WBC 7.4 3.8 - 11.8 x10^3/uL    RBC 3.41 (L) 3.63 - 4.92 x10^6/uL    HGB 11.5 10.9 - 14.3 g/dL    HCT 40.3 47.4 - 25.9 %    MCV 98.7 (H) 75.5 - 95.3 fL    MCH 33.7 (H) 24.7 - 32.8 pg    MCHC 34.1 32.3 - 35.6 g/dL    RDW 56.3 87.5 - 64.3 %    PLATELETS 213 140 - 440 x10^3/uL    MPV 9.4 7.9 - 10.8 fL    NEUTROPHIL % 55 43 - 77 %    LYMPHOCYTE % 33 16 - 44 %    MONOCYTE % 8 5 - 13 %    EOSINOPHIL % 4 1 - 7 %    BASOPHIL % 0 0 - 1 %    NEUTROPHIL # 4.10 1.85 - 7.80 x10^3/uL    LYMPHOCYTE # 2.40 1.00 - 3.00 x10^3/uL    MONOCYTE # 0.60 0.30 - 1.00 x10^3/uL    EOSINOPHIL # 0.30 0.00 - 0.50 x10^3/uL    BASOPHIL # 0.00 0.00 - 0.10 x10^3/uL   MAGNESIUM   Result Value Ref Range    MAGNESIUM 2.4 1.9 - 2.7 mg/dL     Micro  Results: No results found for any visits on 04/24/23 (from the past 24 hour(s)).  Images:   No results found.   XR KUB AND UPRIGHT ABDOMEN   Final Result   GASEOUS DISTENTION OF THE COLON.            Radiologist location ID: [id number]         XR AP MOBILE CHEST   Final Result   NO ACUTE FINDINGS ARE IDENTIFIED WHEN COMPARED TO 08/30/2022.  Radiologist location ID: [id number]         CT ABDOMEN PELVIS WO IV CONTRAST   Final Result   1. CHOLELITHIASIS WITH GALLBLADDER DISTENTION. NO SURROUNDING INFLAMMATORY CHANGES   2. WALL THICKENING IN THE DISTAL ESOPHAGUS COULD BE DIRECTLY VISUALIZED   3. CIRRHOTIC MORPHOLOGY OF THE LIVER          One or more dose reduction techniques were used (e.g., Automated exposure control, adjustment of the mA and/or kV according to patient size, use of iterative reconstruction technique).         Radiologist location ID: [id number]         CT BRAIN WO IV CONTRAST   Final Result   NO ACUTE FINDINGS         One or more dose reduction techniques were used (e.g., Automated exposure control, adjustment of the mA and/or kV according to patient size, use of iterative reconstruction technique).         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   -continue Protonix and Pepcid     Asymptomatic cholelithiasis   -evaluated by Dr Donnal Debar, nothing surgical to be done     Bipolar d/o   -continue home  medications    Cirrhosis   -chronic   -stable     Acute on chronic kidney disease stage 4, prerenal   -Cr 1.84   -am labs       Generalized weakness/frequent falls   -PT/OT has evaluated   -ambulated 25 ft with wheeled walker, one assist   -needs inpatient rehab       Awaiting placement     -further treatment adjustments will be made based off clinical course.  See orders for further details.  -Hospitalist personally evaluated and examined the patient in conjunction with the MLP and agree with the assessments, treatment plan and disposition of the patient as recorded by the [facility].     DVT/PE Prophylaxis: Heparin    Ruben Reason, PA-C

## 2023-04-30 DIAGNOSIS — Z6833 Body mass index (BMI) 33.0-33.9, adult: Secondary | ICD-10-CM

## 2023-04-30 DIAGNOSIS — B3749 Other urogenital candidiasis: Secondary | ICD-10-CM

## 2023-04-30 DIAGNOSIS — F17200 Nicotine dependence, unspecified, uncomplicated: Secondary | ICD-10-CM | POA: Insufficient documentation

## 2023-04-30 LAB — COMPREHENSIVE METABOLIC PANEL, NON-FASTING
ALBUMIN/GLOBULIN RATIO: 1.4 (ref 0.8–1.4)
ALBUMIN: 3 g/dL — ABNORMAL LOW (ref 3.5–5.7)
ALKALINE PHOSPHATASE: 301 U/L — ABNORMAL HIGH (ref 34–104)
ALT (SGPT): 38 U/L (ref 7–52)
ANION GAP: 3 mmol/L — ABNORMAL LOW (ref 4–13)
AST (SGOT): 58 U/L — ABNORMAL HIGH (ref 13–39)
BILIRUBIN TOTAL: 0.3 mg/dL (ref 0.3–1.0)
BUN/CREA RATIO: 19 (ref 6–22)
BUN: 36 mg/dL — ABNORMAL HIGH (ref 7–25)
CALCIUM, CORRECTED: 10.2 mg/dL (ref 8.9–10.8)
CALCIUM: 9.4 mg/dL (ref 8.6–10.3)
CHLORIDE: 114 mmol/L — ABNORMAL HIGH (ref 98–107)
CO2 TOTAL: 24 mmol/L (ref 21–31)
CREATININE: 1.89 mg/dL — ABNORMAL HIGH (ref 0.60–1.30)
ESTIMATED GFR: 27 mL/min/{1.73_m2} — ABNORMAL LOW (ref >59)
GLOBULIN: 2.1 — ABNORMAL LOW (ref 2.9–5.4)
GLUCOSE: 97 mg/dL (ref 74–109)
OSMOLALITY, CALCULATED: 290 mOsm/kg (ref 270–290)
POTASSIUM: 4.4 mmol/L (ref 3.5–5.1)
PROTEIN TOTAL: 5.1 g/dL — ABNORMAL LOW (ref 6.4–8.9)
SODIUM: 141 mmol/L (ref 136–145)

## 2023-04-30 LAB — CBC
HCT: 37.9 % (ref 31.2–41.9)
HGB: 12.7 g/dL (ref 10.9–14.3)
MCH: 33.2 pg — ABNORMAL HIGH (ref 24.7–32.8)
MCHC: 33.6 g/dL (ref 32.3–35.6)
MCV: 98.9 fL — ABNORMAL HIGH (ref 75.5–95.3)
MPV: 9.4 fL (ref 7.9–10.8)
PLATELETS: 221 10*3/uL (ref 140–440)
RBC: 3.83 10*6/uL (ref 3.63–4.92)
RDW: 13.5 % (ref 12.3–17.7)
WBC: 8.3 10*3/uL (ref 3.8–11.8)

## 2023-04-30 LAB — MAGNESIUM: MAGNESIUM: 2.4 mg/dL (ref 1.9–2.7)

## 2023-04-30 MED ORDER — DOCUSATE SODIUM 100 MG CAPSULE
200.0000 mg | ORAL_CAPSULE | Freq: Every day | ORAL | Status: AC
Start: 2023-05-01 — End: 2023-05-31

## 2023-04-30 MED ORDER — OLANZAPINE 5 MG TABLET
5.0000 mg | ORAL_TABLET | Freq: Every evening | ORAL | Status: DC
Start: 2023-04-30 — End: 2023-07-10

## 2023-04-30 MED ORDER — LORAZEPAM 0.5 MG TABLET
1.5000 mg | ORAL_TABLET | Freq: Every evening | ORAL | 0 refills | Status: AC
Start: 2023-04-30 — End: 2023-05-02

## 2023-04-30 MED ORDER — NYSTATIN 100,000 UNIT/GRAM TOPICAL POWDER
Freq: Two times a day (BID) | CUTANEOUS | Status: DC
Start: 2023-04-30 — End: 2023-10-21

## 2023-04-30 MED ORDER — ARIPIPRAZOLE 10 MG TABLET
5.0000 mg | ORAL_TABLET | Freq: Every day | ORAL | Status: DC
Start: 2023-04-30 — End: 2023-05-01
  Administered 2023-04-30: 5 mg via ORAL
  Filled 2023-04-30: qty 1

## 2023-04-30 MED ORDER — FAMOTIDINE 40 MG TABLET
40.0000 mg | ORAL_TABLET | Freq: Every evening | ORAL | Status: AC
Start: 2023-04-30 — End: 2023-05-30

## 2023-04-30 MED ORDER — PANTOPRAZOLE 40 MG TABLET,DELAYED RELEASE
40.0000 mg | DELAYED_RELEASE_TABLET | Freq: Two times a day (BID) | ORAL | Status: DC
Start: 2023-04-30 — End: 2023-10-21

## 2023-04-30 MED ORDER — LORAZEPAM 0.5 MG TABLET
1.5000 mg | ORAL_TABLET | Freq: Every evening | ORAL | 0 refills | Status: DC
Start: 2023-04-30 — End: 2023-04-30

## 2023-04-30 MED ORDER — ARIPIPRAZOLE 5 MG TABLET
5.0000 mg | ORAL_TABLET | Freq: Every day | ORAL | Status: AC
Start: 2023-04-30 — End: 2023-05-04

## 2023-04-30 NOTE — Nurses Notes (Signed)
This nurse still attempting to give report. Paula Cochran answered and states she will find someone to take report. Still waiting on hold.

## 2023-04-30 NOTE — Discharge Summary (Signed)
[place] [facility]     DISCHARGE SUMMARY      PATIENT NAME:  Paula Cochran   MRN:  [id number]  DOB:  11/20/1945    INPATIENT ADMISSION DATE: 04/24/2023   DATE OF DISCHARGE:  04/30/23     ATTENDING PHYSICIAN: Ruben Reason, PA-C     PRIMARY CARE PHYSICIAN: Emilia Beck, DO     HOSPITAL PRESENTATION:    Please see full admission H&P for details.      As per HPI:  04/24/2023       Paula Cochran is a 77 y.o., White female who presents with falls and weakness.  Patient reports that for the past few weeks she has had increased weakness and multiple falls at home.  States that she was ambulating with a walker prior to that at home.  She lives with her husband.  She reports decreased appetite.  Reports constipation, states that she does not think she has had a bowel movement in over a week.  She denies abdominal pain.  CT of the abdomen and pelvis was performed, showed cholelithiasis with gallbladder distention.  No surrounding inflammatory changes.  Walls thickened in the distal esophagus.  Cirrhotic morphology of the liver.  Patient reports that she had some psych medications changed for her bipolar approximately 3 weeks ago and feels like her symptoms seemed to start around that time.  Patient was seen by General surgery in the emergency department.  He did evaluate and reported the patient does not need cholecystectomy at this time.  Recommended GI consult.  Patient also had some yeast in the groin area.  She is not diabetic.  WBCs 13.5.  Creatinine 1.79, GFR 29.  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  Patient was given IV fluids, IV Levaquin and Flagyl in the emergency department.  She was given fleets enema as well.               FURTHER HOSPITAL COURSE WITH DISCHARGE DIAGNOSES:  Patient was admitted and with consultation to GI as well as general surgery . She underwent EGD on 04/25/23 which showed moderate distal esophagitis, grade 2, superficial ulcers at the GE  junction, small hiatal hernia, moderate gastritis, multiple small duodenal ulcers. Recommendations include Pantoprazole 40 mg po bid, before breakfast and before dinner, Famotidine 40 mg po q hs Surgery evaluated the patient and since she was asymptomatic  with gallstones, did not feel surgery was necessary.  Psych was consulted for falls after psych medication changes and recommended stopping the Abilify after 4 days, starting olanzapine 5mg  qhs, and continuing lamotrigine 200 mg daily with psych follow as needed.  Patient has worked with PT who recommended inpatient rehab/SNF.  Her Cr is at baseline. She had a large BM this am. And she can be discharged to [facility] when bed is available to continue inpatient rehab prior to returning home.  She will need to f/u with psych, GI and her PCP once discharged from [facility]. The Hospitalist personally evaluated and examined the patient in conjunction with the MLP and agree with the assessments, treatment plan and disposition of the patient as recorded by the [facility].          Problem List:  Active Hospital Problems   (*Primary Problem)    Diagnosis    *Weakness generalized    Acute renal failure superimposed on stage 4 chronic kidney disease (CMS HCC)    Prerenal acute renal failure (CMS HCC)  Bipolar 1 disorder, depressed, moderate (CMS HCC)    Constipation    Fall    Gallstones    Esophageal thickening    CKD (chronic kidney disease) stage 4, GFR 15-29 ml/min (CMS HCC)         Cirrhosis  needing GI f/u outpatient         Cutaneous candidiasis            PHYSICAL EXAM   DAY OF DISCHARGE:    BP (!) 118/57   Pulse 92   Temp 36.5 C (97.7 F)   Resp 18   Ht 1.575 m (5\' 2" )   Wt 82.5 kg (181 lb 14.1 oz)   SpO2 91%   BMI 33.27 kg/m          General:  Patient is resting in bed, no acute distress, alert and oriented x3   Eyes:  PERRL, no scleral icterus   HENT:  Normocephalic, atraumatic, oral mucosa is moist and pink, no nasal discharge   Heart:  RRR,  Lungs:  Unlabored  respirations.  Lungs are clear to auscultation bilaterally, no wheezes, no rales  Abdomen:  Soft, active bowel sounds, non-tender to palpation, non-distended  Extremities:  Pulses equal in all extremities bilaterally.  Capillary refill less than 3 seconds.  No edema in lower extremities bilaterally   Skin:  Warm and dry.  Not diaphoretic  Neuro:  A&O x 3.  No focal deficits.  Speech intact.  Not tremulous  Psych:  Cooperative, not agitated    LABS:  CBC with Diff (Last 48 Hours):    Recent Results in last 48 hours     04/29/23  0410 04/30/23  0300   WBC 7.4 8.3   HGB 11.5 12.7   HCT 33.6 37.9   MCV 98.7* 98.9*   PLTCNT 213 221   PMNS 55  --    LYMPHOCYTES 33  --    MONOCYTES 8  --    EOSINOPHIL 4  --    BASOPHILS 0  0.00  --           Last BMP  (Last result in the past 48 hours)        Na   K   Cl   CO2   BUN   Cr   Calcium   Glucose   Glucose-Fasting        04/30/23 0300 141   4.4   114   24   36   1.89   9.4   97               Last Hepatic Panel  (Last result in the past 48 hours)        Albumin   Total PTN   Total Bili   Direct Bili   Ast/SGOT   Alt/SGPT   Alk Phos        04/30/23 0300 3.0   5.1   0.3     58   38   301                BMP (Last 48 Hours):    Recent Results in last 48 hours     04/29/23  0410 04/30/23  0300   SODIUM 141 141   POTASSIUM 4.8 4.4   CHLORIDE 112* 114*   CO2 22 24   BUN 28* 36*   CREATININE 1.84* 1.89*   CALCIUM 8.8 9.4   GLUCOSENF 99 97  DISCHARGE MEDICATIONS:     Current Discharge Medication List        START taking these medications.        Details   docusate sodium 100 mg Capsule  Commonly known as: COLACE  Start taking on: May 01, 2023   200 mg, Oral, DAILY  Refills: 0     famotidine 40 mg Tablet  Commonly known as: PEPCID   40 mg, Oral, NIGHTLY  Refills: 0     nystatin 100,000 unit/gram Powder  Commonly known as: NYSTOP   Apply Topically, 2 TIMES DAILY  Refills: 0     OLANZapine 5 mg Tablet  Commonly known as: zyPREXA   5 mg, Oral, NIGHTLY  Refills: 0      pantoprazole 40 mg Tablet, Delayed Release (E.C.)  Commonly known as: PROTONIX   40 mg, Oral, 2 TIMES DAILY BEFORE MEALS  Refills: 0            CONTINUE these medications which have CHANGED during your visit.        Details   ARIPiprazole 5 mg Tablet  Commonly known as: ABILIFY  What changed:   medication strength  how much to take  how to take this   5 mg, Oral, DAILY  Refills: 0     LORazepam 0.5 mg Tablet  Commonly known as: ATIVAN  What changed:   medication strength  Another medication with the same name was removed. Continue taking this medication, and follow the directions you see here.   1.5 mg, Oral, NIGHTLY  Qty: 90 Tablet  Refills: 0            CONTINUE these medications - NO CHANGES were made during your visit.        Details   albuterol sulfate 90 mcg/actuation oral inhaler  Commonly known as: PROVENTIL or VENTOLIN or PROAIR   1-2 Puffs, Inhalation, EVERY 6 HOURS PRN  Refills: 0     Biotin 1 mg Tablet   Oral  Refills: 0     cyanocobalamin 1,000 mcg/mL Solution  Commonly known as: VITAMIN B12   INJECT 1 MILLILITER INTO THE MUSCLE ONCE EVERY MONTH  Refills: 0     ergocalciferol (vitamin D2) 1,250 mcg (50,000 unit) Capsule  Commonly known as: DRISDOL   50,000 Units, Oral, EVERY 7 DAYS  Refills: 0     furosemide 40 mg Tablet  Commonly known as: LASIX   Take 0.5 Tablets (20 mg total) by mouth Once a day  Refills: 0     lamoTRIgine 25 mg Tablet  Commonly known as: LaMICtal   8 Tablets (200 mg total) Once a day  Refills: 0            STOP taking these medications.      allopurinoL 100 mg Tablet  Commonly known as: ZYLOPRIM                 DISCHARGE DISPOSITION:  [place]     DISCHARGE INSTRUCTIONS:    Follow-up Information       Emilia Beck, DO Follow up in 1 week(s).    Specialty: FAMILY MEDICINE  Why: Patient to follow up with PCP at [facility]  Contact information:  [address]  [address]  [place] [address]  [phone]               Duremdes, Cherylann Parr, MD Follow up.    Specialty: GENERAL  SURGERY  Why: Cholelithiasis  Contact information:  [address]  [place] [address]  [phone]               Rogers Seeds, MD Follow up.    Specialty: GASTROENTEROLOGY  Why: abd pain  Contact information:  [address]  [place] [address]  [phone]                              MAGNESIUM     Release to patient Automated [100001]       Follow-up Information       Emilia Beck, DO Follow up in 1 week(s).    Specialty: FAMILY MEDICINE  Why: Patient to follow up with PCP at [facility]  Contact information:  [address]  [address]  [place] [address]  [phone]               Duremdes, Cherylann Parr, MD Follow up.    Specialty: GENERAL SURGERY  Why: Cholelithiasis  Contact information:  [address]  [place] [address]  [phone]               Rogers Seeds, MD Follow up.    Specialty: GASTROENTEROLOGY  Why: abd pain  Contact information:  [address]  [place] [address]  [phone]                                    Copies sent to Care Team         Relationship Specialty Notifications Start End    Emilia Beck, DO PCP - General FAMILY MEDICINE All results, Admissions 11/13/21     Phone: [phone] Fax: [phone]         [address] [place] [address]            >30 minutes total were spent coordinating discharge day today      Ruben Reason, PA-C  [facility] MEDICINE HOSPITALIST

## 2023-04-30 NOTE — OT Treatment (Signed)
[facility] Medicine [facility]  [address]  [place], [address]  [phone]  (Fax) [phone]  Rehabilitation Department     Occupational Therapy Daily Inpatient Note    Date: 04/30/2023  Patient's Name: Paula Cochran  Date of Birth: [birth year]  Height: Height: 157.5 cm (5\' 2" )  Weight: Weight: 82.5 kg (181 lb 14.1 oz)        Plan: Will continue under current POC.         Subjective/Objective/Assessment:      04/30/23 1018   Rehab Session   Document Type therapy progress note (daily note)   OT Visit Date 04/30/23   Total OT Minutes: 15   Symptoms Noted During/After Treatment none   General Information   Patient Profile Reviewed yes   Medical Lines PIV Line;Telemetry   Respiratory Status room air   Existing Precautions/Restrictions fall precautions   Pre Treatment Status   Pre Treatment Patient Status Patient supine in bed;Call light within reach;Nurse approved session;Patient safety alarm activated   Support Present Pre Treatment  None   Communication Pre Treatment  Charge Nurse   Communication Pre Treatment Comment clear for OT   Vital Signs   Pre-Treatment Heart Rate (beats/min) 96   Post-treatment Heart Rate (beats/min) 96   Pre SpO2 (%) 97   Post SpO2 (%) 98   Pain Assessment   Pretreatment Pain Rating 0/10 - no pain   Posttreatment Pain Rating 0/10 - no pain   Cognitive Assessment/Interventions   Behavior/Mood Observations behavior appropriate to situation, WNL/WFL   Bed Mobility Assessment/Treatment   Roll Left Independence minimum assist (75% patient effort);moderate assist (50% patient effort)   Roll Right Independence minimum assist (75% patient effort);moderate assist (50% patient effort)   Post Treatment Status   Post Treatment Patient Status Patient supine in bed;Call light within reach;Patient safety alarm activated   Support Present Post Treatment  None   Clinical Impression   Functional Level at Time of Session patient completed grooming task of brushing hair with set-up.  patient declined sitting EOB or getting up to chair due to having diarrhea. patient reporting she went to the bathroom. patient rolling L to R with min mod A. patient dependent for perineal hygiene this date. patient left in bed with needs wthin reach and safety alarm on.                 Intervention minutes: ADL/IADL TRAINING 15 minutes      THERAPIST  Christ Kick, COTA  04/30/2023, 15:16

## 2023-04-30 NOTE — Care Plan (Signed)
Problem: Adult Inpatient Plan of Care  Goal: Plan of Care Review  Outcome: Ongoing (see interventions/notes)  Goal: Absence of Hospital-Acquired Illness or Injury  Outcome: Ongoing (see interventions/notes)  Intervention: Identify and Manage Fall Risk  Recent Flowsheet Documentation  Taken 04/29/2023 2120 by Weber Cooks, RN  Safety Promotion/Fall Prevention:   activity supervised   fall prevention program maintained   motion sensor pad activated   nonskid shoes/slippers when out of bed   safety round/check completed  Intervention: Prevent Skin Injury  Recent Flowsheet Documentation  Taken 04/29/2023 2120 by Weber Cooks, RN  Body Position: supine, head elevated  Skin Protection:   adhesive use limited   incontinence pads utilized   tubing/devices free from skin contact  Intervention: Prevent and Manage VTE (Venous Thromboembolism) Risk  Recent Flowsheet Documentation  Taken 04/29/2023 2120 by Weber Cooks, RN  VTE Prevention/Management: anticoagulant therapy maintained  Intervention: Prevent Infection  Recent Flowsheet Documentation  Taken 04/29/2023 2120 by Weber Cooks, RN  Infection Prevention:   promote handwashing   personal protective equipment utilized   equipment surfaces disinfected  Goal: Optimal Comfort and Wellbeing  Outcome: Ongoing (see interventions/notes)  Goal: Rounds/Family Conference  Outcome: Ongoing (see interventions/notes)     Problem: Health Knowledge, Opportunity to Enhance (Adult,Obstetrics,Pediatric)  Goal: Knowledgeable about Health Subject/Topic  Description: Patient will demonstrate the desired outcomes by discharge/transition of care.  Outcome: Ongoing (see interventions/notes)     Problem: Skin Injury Risk Increased  Goal: Skin Health and Integrity  Outcome: Ongoing (see interventions/notes)  Intervention: Optimize Skin Protection  Recent Flowsheet Documentation  Taken 04/29/2023 2120 by Weber Cooks, RN  Pressure Reduction Techniques:   Heels elevated off of the bed   Patient turned q 2 hours    Frequent weight shifting encouraged  Pressure Reduction Devices:   Heel offloading device utilized   Repositioning wedges/pillows utilized  Skin Protection:   adhesive use limited   incontinence pads utilized   tubing/devices free from skin contact  Head of Bed (HOB) Positioning: 30 degrees     Problem: Fall Injury Risk  Goal: Absence of Fall and Fall-Related Injury  Outcome: Ongoing (see interventions/notes)  Intervention: Promote Injury-Free Environment  Recent Flowsheet Documentation  Taken 04/29/2023 2120 by Weber Cooks, RN  Safety Promotion/Fall Prevention:   activity supervised   fall prevention program maintained   motion sensor pad activated   nonskid shoes/slippers when out of bed   safety round/check completed

## 2023-04-30 NOTE — Nurses Notes (Signed)
This nurse called multiple time in attempt to call report without success. Placed on hold. No one answered.

## 2023-04-30 NOTE — PT Treatment (Signed)
[facility] Medicine [facility]  [address]  [place], [address]  [phone]  (Fax) [phone]  Rehabilitation Department  Physical Therapy Daily Inpatient Note    Date: 04/30/2023  Patient's Name: Paula Cochran  Date of Birth: 05/14/1946  Height: Height: 157.5 cm (5\' 2" )  Weight: Weight: 82.5 kg (181 lb 14.1 oz)      Plan: Will continue under current POC.      Discharge Disposition: inpatient rehabilitation facility, skilled nursing facility        Subjective/Objective/Assessment:  Flowsheet    04/30/23 1040   Rehab Session   Document Type therapy progress note (daily note)   PT Visit Date 04/30/23   Total PT Minutes: 9   Patient Effort adequate   General Information   Patient Profile Reviewed yes   Medical Lines PIV Line;Telemetry   Respiratory Status room air   Pre Treatment Status   Pre Treatment Patient Status Patient supine in bed;Call light within reach;Patient safety alarm activated;Nurse approved session   Cognitive Assessment/Interventions   Behavior/Mood Observations behavior appropriate to situation, WNL/WFL   Vital Signs   Pre-Treatment Heart Rate (beats/min) 96   Post-treatment Heart Rate (beats/min) 98   Pre SpO2 (%) 96   O2 Delivery Pre Treatment room air   Post SpO2 (%) 96   O2 Delivery Post Treatment room air   Pain Assessment   Pretreatment Pain Rating 0/10 - no pain   Posttreatment Pain Rating 0/10 - no pain   Bed Mobility Assessment/Treatment   Supine-Sit Independence minimum assist (75% patient effort)   Sit to Supine, Independence minimum assist (75% patient effort)   Transfer Assessment/Treatment   Sit-Stand Independence minimum assist (75% patient effort)   Balance   Sitting Balance: Static fair + balance   Sitting, Dynamic (Balance) fair + balance   Sit-to-Stand Balance fair - balance   Standing Balance: Static fair - balance   Standing Balance: Dynamic fair - balance   Post Treatment Status   Post Treatment Patient Status Patient supine in bed;Call light within  reach;Patient safety alarm activated   Support Present Post Treatment  None   Communication Post Treatement Nurse   Physical Therapy Clinical Impression   Assessment Patient reports she is having loose stools and doesn't know how much she can do this morning. She is in agreement to try. She requires min assist from supine to sitting edge of bed.  She stands with min assist. As soon as she stands she starts having loose BM. Patient sits, is able to stand and side step toward head of the bed. She returns to supine with min assist. Nursing notified patient needs assistance with hygiene. Bed check activated and needs in reach.   Anticipated Discharge Disposition inpatient rehabilitation facility;skilled nursing facility                 Intervention minutes: THERAPEUTIC ACTIVITY 9 minutes    THERAPIST  Davison Ohms, PTA  04/30/2023, 12:08

## 2023-05-13 ENCOUNTER — Encounter (INDEPENDENT_AMBULATORY_CARE_PROVIDER_SITE_OTHER): Payer: Self-pay | Admitting: Surgery

## 2023-06-05 ENCOUNTER — Encounter (INDEPENDENT_AMBULATORY_CARE_PROVIDER_SITE_OTHER): Payer: Commercial Managed Care - PPO | Admitting: Student in an Organized Health Care Education/Training Program

## 2023-06-09 NOTE — Progress Notes (Deleted)
UROLOGY, [facility]  [address] [place] [address]    Progress Note    Name: JAONNA WORD MRN:  [id number]   Date: 06/10/2023 DOB:  1945/09/01 (77 y.o.)             Chief Complaint: No chief complaint on file.  Subjective   Subjective:   Mrs. Stukes is a pleasant 77 year old female with a history of chronic kidney disease stage 4.  In evaluation of her CKD she recently underwent a renal ultrasound.  This demonstrated a left renal cyst measuring roughly 2 cm as well as a left renal lesion measuring 2.8 cm.  She has a remote smoking history for about 10 years with about 5 cigarettes per day.  She quit about 30 years ago.  She denies occupational chemical exposure history.  She denies a personal or family history of urologic malignancy.  She denies a personal history of malignancy. She denies fevers, chills, nausea, vomiting, hematuria, dysuria, flank pain, incontinence, dribbling, hesitancy, suprapubic pain, headaches, vision changes, shortness of breath, chest pain.          Objective   Objective :  There were no vitals taken for this visit.      Gen: NAD, alert  Pulm: unlabored at rest  CV: palpable pulses  Abd: soft, Nt/ND  GU: no suprapubic tenderness, no CVAT      Data reviewed:    Current Outpatient Medications   Medication Sig    albuterol sulfate (PROVENTIL OR VENTOLIN OR PROAIR) 90 mcg/actuation Inhalation oral inhaler Take 1-2 Puffs by inhalation Every 6 hours as needed    Biotin 1 mg Oral Tablet Take by mouth    cyanocobalamin (VITAMIN B12) 1,000 mcg/mL Injection Solution INJECT 1 MILLILITER INTO THE MUSCLE ONCE EVERY MONTH    ergocalciferol, vitamin D2, (DRISDOL) 1,250 mcg (50,000 unit) Oral Capsule Take 1 Capsule (50,000 Units total) by mouth Every 7 days    furosemide (LASIX) 40 mg Oral Tablet Take 0.5 Tablets (20 mg total) by mouth Once a day    lamoTRIgine (LAMICTAL) 25 mg Oral Tablet 8 Tablets (200 mg total) Once a day    nystatin (NYSTOP) 100,000 unit/gram Powder Apply  topically Twice daily    OLANZapine (ZYPREXA) 5 mg Oral Tablet Take 1 Tablet (5 mg total) by mouth Every night    pantoprazole (PROTONIX) 40 mg Oral Tablet, Delayed Release (E.C.) Take 1 Tablet (40 mg total) by mouth Twice a day before meals        Assessment/Plan  Problem List Items Addressed This Visit    None    Left renal cyst, 2cm  I had a very lengthy discussion with patient regarding the characteristics, nature and work-up of their renal cyst, as well as the rationale, implications and alternatives of the various management options.  We spent considerable time personally reviewing relevant imaging which are consistent with a incompletely characterized renal cyst  I discussed the nature of incompletely characterized renal cyst warranting further dedicated imaging for further lesion characterization  I discussed the nature of simple appearing Bosniak I renal cyst which have been reported to affect more than 50% of patients over the age of 37 and warrant no further workup, surveillance or intervention if asymptomatic; however, repeat ultrasonography may be considered at 6 to 12 months in selected patients in order to confirm stability and a correct diagnosis  I discussed the nature of minimally complex Bosniak II renal cyst which pose limited oncologic potential and  generally do not warrant further radiographic surveillance; however, repeat ultrasonography may be considered at 6 to 12 months in selected patients in order to confirm stability and a correct diagnosis  I discussed the nature of complex Bosniak IIF renal cyst which has a reported incidence of malignancy ranging from 5-20% and accordingly warrants further radiographic surveillance  I discussed the nature of complex Bosniak III renal cyst which carries nearly a 50% risk of malignancy and warrants surgical extirpation  I discussed the nature of complex Bosniak IV renal cyst which carries nearly a 90% risk of malignancy and warrants surgical  extirpation  Based on patient's clinical and radiographic findings, I would recommend renal ultrasound in 6 months  Generally the indications for surgical intervention for renal cysts include suspected malignancy, pain attributable to cyst expansion, cyst infection and angiotensin-dependent hypertension.  We also discussed the limited role of percutaneous renal fine-needle aspiration (FNA) biopsy; unfortunately, biopsy cannot reliably distinguish a benign renal cyst from the less common cystic renal cell carcinoma.  Preoperative needle biopsy has generally not been recommended for resectable renal lesions, because of concern about seeding of the peritoneum.        Left renal lesion, 2.8cm   I had a very lengthy discussion with patient where I reviewed in detail recent imaging including renal ultrasound. Importantly, the patient's renal cell carcinoma-specific risk factors include: hypertension, obesity, previous nicotine dependence        Patient was educated and counseled on the incidence, nature and risk factors for renal cell carcinoma; notably, I stressed the 80-90% probability that a solid mass of this nature and imaging characteristics represents malignancy. Treatment options were discussed including observation, percutaneous biopsy, radical nephrectomy (open v. laparoscopic), partial nephrectomy (open v. laparoscopic), thermal ablation (open v. laparoscopic v. percutaneous). The risks, advantages, and disadvantages of each approach were discussed in detail.      We briefly discussed the role of percutaneous, image-guided renal mass biopsy in patients in whom it might impact management, particularly in patients with clinical or radiographic findings suggestive of lymphoma, abscess or metastasis.  We also discussed the limitations of renal mass biopsy including the non-diagnostic yield and hybrid-type tumors which may lend a false sense of reassurance of a perceived indolent tumor such as oncocytoma.   Additionally, renal mass biopsy is not without risks with well-documented risks of retroperitoneal hemorrhage and possible, albeit rare, biopsy tract seeding.     For clinical T1a renal masses (<4.0 cm) in an otherwise health patient without coexistant medical comorbidities or baseline chronic kidney disease, complete surgical excision achieved by either partial nephrectomy or radical nephrectomy when nephron sparing surgery is not technically feasible are considered the standard of care per the 2009 American Urological Association "Guideline for the Management of the Clinical Stage 1 Renal Mass".  Partial nephrectomy, if possible, is recommended when the need exists to preserve renal function, however, can be associated with increased urologic morbidity including higher risk of hemorrhage and urinary fistula.     For clinical T1b+ (>4 cm) renal masses in an otherwise healthy patient, both radical nephrectomy in the setting of a normal contralateral kidney or partial nephrectomy when the need exists to preserve renal function are considered standards, each given equal weight per the 2009 American Urological Association "Guideline for the Management of the Clinical Stage 1 Renal Mass".  I spent time discussing with patient that the overwhelming majority of studies evaluating partial nephrectomy in higher stage (cT1b+) are observational, retrospective, reported findings on samples of  convenience that were not randomized to treatments and involved only one treatment group. There are inherent, unknownand unquantifiable biases within each study because of the lack of randomization and precludes confident evidence-based guideline directives       Patient's potential risk factors for the development of chronic kidney disease include:  hypertension, cardiovascular disease, nicotine dependence, obesity, dyslipidemia,  positive family history of kidney disease, advanced age > 65 years.  We discussed the understood risks of  either open or minimally invasive renal surgery including bleeding requiring blood transfusion, infection, adjacent organ injury, urine leak, positive margins, severe pain and standard operative risks such as myocardial infarction, stroke, venous thromboembolism (VTE), pneumonia, PE and even a 0.2% chance of death.       Thermal ablative therapies, either cryoablation or radiofrequency ablation, are considered less-invasive treatment options, however, may be associated with slightly higher risk of local tumor recurrence and more challenging surgical salvage; additionally, measures of success are less well-defined.    Compared to surgical extirpation, there are inherent, unknownand unquantifiable biases within the evidence for thermal ablation due to the lack of randomization and precludes confident evidence-based guideline directives.     Active surveillance with delayed intervention was also discussed with patient  as an option in patients who want to avoid surgery and are willing to accept an increased risk of tumor progression compared to either radical nephrectomy or partial nephrectomy.  Ideally, active surveillance is best utilized in a small renal mass (<4 cm) in a patient with high operative and anesthetic risks.     Patient request renal mass biopsy which will be scheduled with IR.        Chronic kidney disease, stage 4  Following with Nephrology.  Recommendations appreciated        Arlana Hove, DO     A combined total of 30 minutes were spent preparing to see the patient, reviewing previous records, ordering tests/medications/procedures, documenting the clinical encounter as well as performing a medically appropriate evaluation and independently interpreting results and communicating them to the patient/family/caregiver as specifically outlined above in the impression and plan.    This note may have been fully or partially generated using MModal Fluency Direct system, and there may be some incorrect  words, spellings, and punctuation that were not identified in checking the note before saving.

## 2023-06-10 ENCOUNTER — Encounter (INDEPENDENT_AMBULATORY_CARE_PROVIDER_SITE_OTHER): Payer: Commercial Managed Care - PPO | Admitting: Student in an Organized Health Care Education/Training Program

## 2023-06-10 DIAGNOSIS — N2889 Other specified disorders of kidney and ureter: Secondary | ICD-10-CM

## 2023-06-10 DIAGNOSIS — N281 Cyst of kidney, acquired: Secondary | ICD-10-CM

## 2023-07-10 ENCOUNTER — Ambulatory Visit (INDEPENDENT_AMBULATORY_CARE_PROVIDER_SITE_OTHER): Payer: Commercial Managed Care - PPO | Admitting: Student in an Organized Health Care Education/Training Program

## 2023-07-10 ENCOUNTER — Encounter (INDEPENDENT_AMBULATORY_CARE_PROVIDER_SITE_OTHER): Payer: Self-pay | Admitting: Student in an Organized Health Care Education/Training Program

## 2023-07-10 ENCOUNTER — Other Ambulatory Visit: Payer: Self-pay

## 2023-07-10 VITALS — BP 111/69 | HR 74 | Ht 62.0 in | Wt 173.0 lb

## 2023-07-10 DIAGNOSIS — N281 Cyst of kidney, acquired: Secondary | ICD-10-CM

## 2023-07-10 DIAGNOSIS — N184 Chronic kidney disease, stage 4 (severe): Secondary | ICD-10-CM

## 2023-07-10 DIAGNOSIS — K7460 Unspecified cirrhosis of liver: Secondary | ICD-10-CM | POA: Insufficient documentation

## 2023-07-10 DIAGNOSIS — N289 Disorder of kidney and ureter, unspecified: Secondary | ICD-10-CM

## 2023-07-10 NOTE — Progress Notes (Unsigned)
[facility]OLOGY, [facility]  [address] [place] [address]    Progress Note    Name: Paula Cochran MRN:  [id number]   Date: 07/10/2023 DOB:  29-May-1946 (77 y.o.)             Chief Complaint: Follow Up 6 Months (F/U on renal lesion)  Subjective   Subjective:   Paula Cochran is a pleasant 77 year old female with a history of chronic kidney disease stage 4.  In evaluation of her CKD she recently underwent a renal ultrasound.  This demonstrated a left renal cyst measuring roughly 2 cm as well as a left renal lesion measuring 2.8 cm.  She has a remote smoking history for about 10 years with about 5 cigarettes per day.  She quit about 30 years ago.  She denies occupational chemical exposure history.  She denies a personal or family history of urologic malignancy.  She denies a personal history of malignancy. She denies fevers, chills, nausea, vomiting, hematuria, dysuria, flank pain, incontinence, dribbling, hesitancy, suprapubic pain, headaches, vision changes, shortness of breath, chest pain.             Objective   Objective :  BP 111/69 (Site: Right Arm, Patient Position: Sitting, Cuff Size: Adult)   Pulse 74   Ht 1.575 m (5\' 2" )   Wt 78.5 kg (173 lb)   BMI 31.64 kg/m       Gen: NAD, alert  Pulm: unlabored at rest  CV: palpable pulses  Abd: soft, Nt/ND  GU: no suprapubic tenderness, no CVAT      Data reviewed:    Current Outpatient Medications   Medication Sig    albuterol sulfate (PROVENTIL OR VENTOLIN OR PROAIR) 90 mcg/actuation Inhalation oral inhaler Take 1-2 Puffs by inhalation Every 6 hours as needed    Biotin 1 mg Oral Tablet Take by mouth    cyanocobalamin (VITAMIN B12) 1,000 mcg/mL Injection Solution INJECT 1 MILLILITER INTO THE MUSCLE ONCE EVERY MONTH    ergocalciferol, vitamin D2, (DRISDOL) 1,250 mcg (50,000 unit) Oral Capsule Take 1 Capsule (50,000 Units total) by mouth Every 7 days    famotidine (PEPCID) 40 mg Oral Tablet Take 1 Tablet (40 mg total) by mouth Every night     furosemide (LASIX) 40 mg Oral Tablet Take 0.5 Tablets (20 mg total) by mouth Once a day    lamoTRIgine (LAMICTAL) 200 mg Oral Tablet Take 1 Tablet (200 mg total) by mouth Once a day    lamoTRIgine (LAMICTAL) 25 mg Oral Tablet 8 Tablets (200 mg total) Once a day    LORazepam (ATIVAN) 1 mg Oral Tablet Take 1 Tablet (1 mg total) by mouth Every night    nystatin (NYSTOP) 100,000 unit/gram Powder Apply topically Twice daily    OLANZapine (ZYPREXA) 2.5 mg Oral Tablet Take 1 Tablet (2.5 mg total) by mouth Every night    pantoprazole (PROTONIX) 40 mg Oral Tablet, Delayed Release (E.C.) Take 1 Tablet (40 mg total) by mouth Twice a day before meals    potassium chloride (K-TAB) 20 mEq Oral Tablet Sustained Release Take 1 Tablet (20 mEq total) by mouth Every other day        Assessment/Plan  Problem List Items Addressed This Visit    None    Left renal cyst, 2cm  I had a very lengthy discussion with patient regarding the characteristics, nature and work-up of their renal cyst, as well as the rationale, implications and alternatives of the various management options.  We spent considerable time personally reviewing relevant imaging which are consistent with a incompletely characterized renal cyst  I discussed the nature of incompletely characterized renal cyst warranting further dedicated imaging for further lesion characterization  I discussed the nature of simple appearing Bosniak I renal cyst which have been reported to affect more than 50% of patients over the age of 37 and warrant no further workup, surveillance or intervention if asymptomatic; however, repeat ultrasonography may be considered at 6 to 12 months in selected patients in order to confirm stability and a correct diagnosis  I discussed the nature of minimally complex Bosniak II renal cyst which pose limited oncologic potential and generally do not warrant further radiographic surveillance; however, repeat ultrasonography may be considered at 6 to 12 months in  selected patients in order to confirm stability and a correct diagnosis  I discussed the nature of complex Bosniak IIF renal cyst which has a reported incidence of malignancy ranging from 5-20% and accordingly warrants further radiographic surveillance  I discussed the nature of complex Bosniak III renal cyst which carries nearly a 50% risk of malignancy and warrants surgical extirpation  I discussed the nature of complex Bosniak IV renal cyst which carries nearly a 90% risk of malignancy and warrants surgical extirpation  Based on patient's clinical and radiographic findings, I would recommend renal ultrasound in 6 months  Generally the indications for surgical intervention for renal cysts include suspected malignancy, pain attributable to cyst expansion, cyst infection and angiotensin-dependent hypertension.  We also discussed the limited role of percutaneous renal fine-needle aspiration (FNA) biopsy; unfortunately, biopsy cannot reliably distinguish a benign renal cyst from the less common cystic renal cell carcinoma.  Preoperative needle biopsy has generally not been recommended for resectable renal lesions, because of concern about seeding of the peritoneum.        Left renal lesion, 2.8cm   I had a very lengthy discussion with patient where I reviewed in detail recent imaging including renal ultrasound. Importantly, the patient's renal cell carcinoma-specific risk factors include: hypertension, obesity, previous nicotine dependence        Patient was educated and counseled on the incidence, nature and risk factors for renal cell carcinoma; notably, I stressed the 80-90% probability that a solid mass of this nature and imaging characteristics represents malignancy. Treatment options were discussed including observation, percutaneous biopsy, radical nephrectomy (open v. laparoscopic), partial nephrectomy (open v. laparoscopic), thermal ablation (open v. laparoscopic v. percutaneous). The risks, advantages, and  disadvantages of each approach were discussed in detail.      We briefly discussed the role of percutaneous, image-guided renal mass biopsy in patients in whom it might impact management, particularly in patients with clinical or radiographic findings suggestive of lymphoma, abscess or metastasis.  We also discussed the limitations of renal mass biopsy including the non-diagnostic yield and hybrid-type tumors which may lend a false sense of reassurance of a perceived indolent tumor such as oncocytoma.  Additionally, renal mass biopsy is not without risks with well-documented risks of retroperitoneal hemorrhage and possible, albeit rare, biopsy tract seeding.     For clinical T1a renal masses (<4.0 cm) in an otherwise health patient without coexistant medical comorbidities or baseline chronic kidney disease, complete surgical excision achieved by either partial nephrectomy or radical nephrectomy when nephron sparing surgery is not technically feasible are considered the standard of care per the 2009 American Urological Association "Guideline for the Management of the Clinical Stage 1 Renal Mass".  Partial nephrectomy, if possible, is recommended  when the need exists to preserve renal function, however, can be associated with increased urologic morbidity including higher risk of hemorrhage and urinary fistula.     For clinical T1b+ (>4 cm) renal masses in an otherwise healthy patient, both radical nephrectomy in the setting of a normal contralateral kidney or partial nephrectomy when the need exists to preserve renal function are considered standards, each given equal weight per the 2009 American Urological Association "Guideline for the Management of the Clinical Stage 1 Renal Mass".  I spent time discussing with patient that the overwhelming majority of studies evaluating partial nephrectomy in higher stage (cT1b+) are observational, retrospective, reported findings on samples of convenience that were not randomized  to treatments and involved only one treatment group. There are inherent, unknownand unquantifiable biases within each study because of the lack of randomization and precludes confident evidence-based guideline directives       Patient's potential risk factors for the development of chronic kidney disease include:  hypertension, cardiovascular disease, nicotine dependence, obesity, dyslipidemia,  positive family history of kidney disease, advanced age > 65 years.  We discussed the understood risks of either open or minimally invasive renal surgery including bleeding requiring blood transfusion, infection, adjacent organ injury, urine leak, positive margins, severe pain and standard operative risks such as myocardial infarction, stroke, venous thromboembolism (VTE), pneumonia, PE and even a 0.2% chance of death.       Thermal ablative therapies, either cryoablation or radiofrequency ablation, are considered less-invasive treatment options, however, may be associated with slightly higher risk of local tumor recurrence and more challenging surgical salvage; additionally, measures of success are less well-defined.    Compared to surgical extirpation, there are inherent, unknownand unquantifiable biases within the evidence for thermal ablation due to the lack of randomization and precludes confident evidence-based guideline directives.     Active surveillance with delayed intervention was also discussed with patient  as an option in patients who want to avoid surgery and are willing to accept an increased risk of tumor progression compared to either radical nephrectomy or partial nephrectomy.  Ideally, active surveillance is best utilized in a small renal mass (<4 cm) in a patient with high operative and anesthetic risks.     Patient request renal mass biopsy which will be scheduled with IR.        Chronic kidney disease, stage 4  Following with Nephrology.  Recommendations appreciated          Arlana Hove, DO       A  combined total of 30 minutes were spent preparing to see the patient, reviewing previous records, ordering tests/medications/procedures, documenting the clinical encounter as well as performing a medically appropriate evaluation and independently interpreting results and communicating them to the patient/family/caregiver as specifically outlined above in the impression and plan.    This note may have been fully or partially generated using MModal Fluency Direct system, and there may be some incorrect words, spellings, and punctuation that were not identified in checking the note before saving.

## 2023-07-16 ENCOUNTER — Other Ambulatory Visit (INDEPENDENT_AMBULATORY_CARE_PROVIDER_SITE_OTHER): Payer: Self-pay

## 2023-07-23 ENCOUNTER — Other Ambulatory Visit: Payer: Self-pay

## 2023-07-23 ENCOUNTER — Ambulatory Visit (INDEPENDENT_AMBULATORY_CARE_PROVIDER_SITE_OTHER): Payer: Commercial Managed Care - PPO | Admitting: Nephrology

## 2023-07-23 DIAGNOSIS — E559 Vitamin D deficiency, unspecified: Secondary | ICD-10-CM

## 2023-07-23 DIAGNOSIS — N184 Chronic kidney disease, stage 4 (severe): Secondary | ICD-10-CM

## 2023-07-23 DIAGNOSIS — N2581 Secondary hyperparathyroidism of renal origin: Secondary | ICD-10-CM

## 2023-07-23 DIAGNOSIS — I129 Hypertensive chronic kidney disease with stage 1 through stage 4 chronic kidney disease, or unspecified chronic kidney disease: Secondary | ICD-10-CM

## 2023-07-23 DIAGNOSIS — T56891A Toxic effect of other metals, accidental (unintentional), initial encounter: Secondary | ICD-10-CM

## 2023-07-23 MED ORDER — EMPAGLIFLOZIN 10 MG TABLET
10.0000 mg | ORAL_TABLET | Freq: Every day | ORAL | 0 refills | Status: DC
Start: 2023-07-23 — End: 2023-08-19

## 2023-07-23 NOTE — Progress Notes (Signed)
NEPH[facility]OLOGY, [facility]  [address] [place] [address]       Name: Paula Cochran MRN:  [id number]   Date of Birth: Jul 02, 1946 Age: 77 y.o.   Date: 07/23/2023  Time: 14:00       Nephrology Office Note    Reason for visit: Follow Up (Follow up labs )      History of Present Illness:  Paula Cochran is a 77 y.o. female with past medical history of bipolar disorder was on lithium for many many years she has underlying chronic kidney disease stage 4.    Patient is here for follow-up she denies nausea or vomiting no chest pain shortness of breath her blood pressure under good control she is not diabetic.  Patient REPORTS MILD EDEMA SHE TAKES LASIX P.R.N. DISCUSSED WITH HER ADDING JARDIANCE.  DISCUSSED WITH HER RISKS AND BENEFITS OF USING JARDIANCE.Marland Kitchen      LAB WORK HEMOGLOBIN 14.2 GLUCOSE 75 CREATININE 2.31 GFR 21 CHLORIDE 109 SODIUM 144 POTASSIUM 4.3 CALCIUM 9.6 TOTAL PROTEIN CREATININE RATIO 0.3 9, ALBUMIN TO CREATININE RATIO IS 39 VITAMIN-D 56 URIC ACID 6.9 MAGNESIUM 2.2 PTH 80    Past Medical History:  Past Medical History:   Diagnosis Date    Arthropathy     Asthma     Bipolar disorder, unspecified (CMS HCC)     Cancer (CMS HCC)     squamous cell    CKD (chronic kidney disease)     Wears glasses          Past Surgical History:  Past Surgical History:   Procedure Laterality Date    HX APPENDECTOMY      HX DILATION AND CURETTAGE      HX HYSTERECTOMY           Allergies:  Allergies   Allergen Reactions    Amoxicillin Anaphylaxis, Hives/ Urticaria and Shortness of Breath     Has patient had a PCN reaction causing immediate rash, facial/tongue/throat swelling, SOB or lightheadedness with hypotension: No   Has patient had a PCN reaction causing severe rash involving mucus membranes or skin necrosis: No   Has patient had a PCN reaction that required hospitalization No   Has patient had a PCN reaction occurring within the last 10 years: Unknown   If all of the above answers are "NO", then may proceed  with Cephalosporin use.    Adhesive Rash, Itching and Hives/ Urticaria    Nickel Rash and Itching     Medications:  Current Outpatient Medications   Medication Sig    albuterol sulfate (PROVENTIL OR VENTOLIN OR PROAIR) 90 mcg/actuation Inhalation oral inhaler Take 1-2 Puffs by inhalation Every 6 hours as needed    Biotin 1 mg Oral Tablet Take by mouth    empagliflozin (JARDIANCE) 10 mg Oral Tablet Take 1 Tablet (10 mg total) by mouth Once a day for 30 days    famotidine (PEPCID) 40 mg Oral Tablet Take 1 Tablet (40 mg total) by mouth Every night    lamoTRIgine (LAMICTAL) 200 mg Oral Tablet Take 1 Tablet (200 mg total) by mouth Once a day    LORazepam (ATIVAN) 1 mg Oral Tablet Take 1 Tablet (1 mg total) by mouth Every night    nystatin (NYSTOP) 100,000 unit/gram Powder Apply topically Twice daily    OLANZapine (ZYPREXA) 2.5 mg Oral Tablet Take 1 Tablet (2.5 mg total) by mouth Every night    pantoprazole (PROTONIX) 40 mg Oral Tablet, Delayed Release (E.C.)  Take 1 Tablet (40 mg total) by mouth Twice a day before meals     Family History:  Family Medical History:    None         Social History:  Social History     Socioeconomic History    Marital status: Married   Tobacco Use    Smoking status: Every Day     Current packs/day: 0.25     Average packs/day: 0.3 packs/day for 20.0 years (5.0 ttl pk-yrs)     Types: Cigarettes    Smokeless tobacco: Never   Vaping Use    Vaping status: Never Used   Substance and Sexual Activity    Alcohol use: Never    Drug use: Never    Sexual activity: Yes     Social Determinants of Health     Social Connections: Low Risk  (04/24/2023)    Social Connections     SDOH Social Isolation: 5 or more times a week       Review of Systems:        Systematic review of 12 organ systems was negative except what mentioned in in the HPI.      Physical Exam:  There were no vitals filed for this visit.     Patient is alert awake and oriented not in acute distress.  Normal mood and affect.  Neck exam no JVD  normal inspection.  Cardiovascular system: Regular rate and rhythm no murmurs rubs or gallops. No chest wall tenderness  Lungs: Clear to auscultation bilaterally no wheezing no rhonchi.  Abdomen soft nontender nondistended.  Extremities +1 EDEMA  Neuro exam: EOMI, normal speech      Lab:  Lab Results   Component Value Date    BUN 36 (H) 04/30/2023    BUN 28 (H) 04/29/2023    BUN 28 (H) 04/28/2023    CREATININE 1.89 (H) 04/30/2023    CREATININE 1.84 (H) 04/29/2023    CREATININE 1.88 (H) 04/28/2023    BUNCRRATIO 19 04/30/2023    BUNCRRATIO 15 04/29/2023    BUNCRRATIO 15 04/28/2023    GFR 27 (L) 04/30/2023    GFR 28 (L) 04/29/2023    GFR 27 (L) 04/28/2023    SODIUM 141 04/30/2023    SODIUM 141 04/29/2023    SODIUM 144 04/28/2023    POTASSIUM 4.4 04/30/2023    POTASSIUM 4.8 04/29/2023    POTASSIUM 4.3 04/28/2023    CHLORIDE 114 (H) 04/30/2023    CHLORIDE 112 (H) 04/29/2023    CHLORIDE 116 (H) 04/28/2023    CO2 24 04/30/2023    CO2 22 04/29/2023    CO2 22 04/28/2023    ANIONGAP 3 (L) 04/30/2023    ANIONGAP 7 04/29/2023    ANIONGAP 6 04/28/2023    CALCIUM 9.4 04/30/2023    CALCIUM 8.8 04/29/2023    CALCIUM 8.9 04/28/2023    PHOSPHORUS 3.6 (L) 10/03/2022    ALBUMIN 3.0 (L) 04/30/2023    ALBUMIN 2.6 (L) 04/29/2023    ALBUMIN 2.7 (L) 04/28/2023    HGB 12.7 04/30/2023    HGB 11.5 04/29/2023    HGB 12.1 04/28/2023    HCT 37.9 04/30/2023    HCT 33.6 04/29/2023    HCT 35.8 04/28/2023    INTACTPTH 49.2 03/17/2023    INTACTPTH 127.1 (H) 10/03/2022    IRON 169 10/03/2022    IRONBINDCAP 270 10/03/2022    IRONSAT 63 (H) 10/03/2022    FERRITIN 68 10/03/2022       Lab Results  Component Value Date    URICACID 4.9 03/17/2023    URICACID 8.2 (H) 10/03/2022        Assessment and Plan:  ENCOUNTER DIAGNOSES     ICD-10-CM   1. CKD (chronic kidney disease) stage 4, GFR 15-29 ml/min (CMS HCC)  N18.4   2. Vitamin D deficiency  E55.9              Chronic kidney disease  -Stage 4  -Due to chronic lithium toxicity.  Patient has stopped her  lithium completely she is on Abilify and Lamictal.  -Creatinine is worse 2.3/GFR 21  -Baseline creatinine 1.8  -Total protein to creatinine ratio, 0.3  -UACR 39  -CBC and a basic metabolic panel  -Return to clinic in 4 months  -Continue low-sodium diet  -balanced Fluid intake 40-50 oz a day.    -Avoid NSAIDs.  Tylenol only for pain  -Renal imaging MRI indicating no masses, follow up with Urology outpatient.  Renal ultrasound indicating medical renal disease.  -ACEI or ARBS:  no  -Sodium Glucose Cotransporter-2 (SGLT2) Inhibitors:  Start Jardiance  -Lasix p.r.n.    CKD bone mineral disease/secondary hyperparathyroidism   -PTH better  PTH   Date Value Ref Range Status   03/17/2023 49.2 12.0 - 88.0 pg/mL Final      -Vitamin D level at goal    Hypertension   -blood pressure is at goal    Orders Placed This Encounter    BASIC METABOLIC PANEL    CBC/DIFF    MAGNESIUM    MICROALBUMIN/CREATININE RATIO, URINE, RANDOM    PARATHYROID HORMONE (PTH)    PROTEIN/CREATININE RATIO, URINE, RANDOM    URIC ACID    VITAMIN D 25 TOTAL    empagliflozin (JARDIANCE) 10 mg Oral Tablet

## 2023-08-19 ENCOUNTER — Other Ambulatory Visit (INDEPENDENT_AMBULATORY_CARE_PROVIDER_SITE_OTHER): Payer: Self-pay | Admitting: Nephrology

## 2023-08-19 MED ORDER — EMPAGLIFLOZIN 10 MG TABLET
10.0000 mg | ORAL_TABLET | Freq: Every day | ORAL | 3 refills | Status: DC
Start: 2023-08-19 — End: 2023-09-25

## 2023-09-25 ENCOUNTER — Other Ambulatory Visit (INDEPENDENT_AMBULATORY_CARE_PROVIDER_SITE_OTHER): Payer: Self-pay | Admitting: Nephrology

## 2023-09-25 DIAGNOSIS — N184 Chronic kidney disease, stage 4 (severe): Secondary | ICD-10-CM

## 2023-09-25 MED ORDER — EMPAGLIFLOZIN 10 MG TABLET
10.0000 mg | ORAL_TABLET | Freq: Every day | ORAL | 3 refills | Status: DC
Start: 2023-09-25 — End: 2023-10-21

## 2023-09-25 NOTE — Telephone Encounter (Signed)
Sent Jardiance 10 mg to [facility] on 09/25/23 at 8:26 am per Dr. Deatra Robinson    90 days and 3 refills sent

## 2023-10-01 ENCOUNTER — Other Ambulatory Visit (HOSPITAL_COMMUNITY): Payer: Self-pay | Admitting: Family

## 2023-10-01 DIAGNOSIS — R935 Abnormal findings on diagnostic imaging of other abdominal regions, including retroperitoneum: Secondary | ICD-10-CM

## 2023-10-09 ENCOUNTER — Ambulatory Visit: Attending: Family

## 2023-10-09 ENCOUNTER — Other Ambulatory Visit: Payer: Self-pay

## 2023-10-09 DIAGNOSIS — R935 Abnormal findings on diagnostic imaging of other abdominal regions, including retroperitoneum: Secondary | ICD-10-CM | POA: Insufficient documentation

## 2023-10-09 LAB — COMPREHENSIVE METABOLIC PANEL, NON-FASTING
ALBUMIN/GLOBULIN RATIO: 1.8 — ABNORMAL HIGH (ref 0.8–1.4)
ALBUMIN: 4.4 g/dL (ref 3.5–5.7)
ALKALINE PHOSPHATASE: 171 U/L — ABNORMAL HIGH (ref 34–104)
ALT (SGPT): 11 U/L (ref 7–52)
ANION GAP: 9 mmol/L (ref 4–13)
AST (SGOT): 18 U/L (ref 13–39)
BILIRUBIN TOTAL: 0.6 mg/dL (ref 0.3–1.0)
BUN/CREA RATIO: 12 (ref 6–22)
BUN: 22 mg/dL (ref 7–25)
CALCIUM, CORRECTED: 9.6 mg/dL (ref 8.9–10.8)
CALCIUM: 9.9 mg/dL (ref 8.6–10.3)
CHLORIDE: 109 mmol/L — ABNORMAL HIGH (ref 98–107)
CO2 TOTAL: 24 mmol/L (ref 21–31)
CREATININE: 1.9 mg/dL — ABNORMAL HIGH (ref 0.60–1.30)
ESTIMATED GFR: 27 mL/min/{1.73_m2} — ABNORMAL LOW (ref >59)
GLOBULIN: 2.4 (ref 2.0–3.5)
GLUCOSE: 95 mg/dL (ref 74–109)
OSMOLALITY, CALCULATED: 286 mosm/kg (ref 270–290)
POTASSIUM: 4.1 mmol/L (ref 3.5–5.1)
PROTEIN TOTAL: 6.8 g/dL (ref 6.4–8.9)
SODIUM: 142 mmol/L (ref 136–145)

## 2023-10-09 LAB — CBC
HCT: 48.1 % — ABNORMAL HIGH (ref 31.2–41.9)
HGB: 15.9 g/dL — ABNORMAL HIGH (ref 10.9–14.3)
MCH: 32 pg (ref 24.7–32.8)
MCHC: 33.1 g/dL (ref 32.3–35.6)
MCV: 96.8 fL — ABNORMAL HIGH (ref 75.5–95.3)
MPV: 8.9 fL (ref 7.9–10.8)
PLATELETS: 291 10*3/uL (ref 140–440)
RBC: 4.97 10*6/uL — ABNORMAL HIGH (ref 3.63–4.92)
RDW: 13.1 % (ref 12.3–17.7)
WBC: 9.4 10*3/uL (ref 3.8–11.8)

## 2023-10-09 LAB — PT/INR
INR: 0.88 (ref 0.84–1.10)
PROTHROMBIN TIME: 9.9 s (ref 9.8–12.7)

## 2023-10-10 LAB — ALPHA FETOPROTEIN (AFP) TUMOR MARKER: AFP TUMOR MARKER: 3 ng/mL (ref <=9)

## 2023-10-14 ENCOUNTER — Other Ambulatory Visit (INDEPENDENT_AMBULATORY_CARE_PROVIDER_SITE_OTHER): Payer: Self-pay

## 2023-10-14 ENCOUNTER — Ambulatory Visit: Attending: Nephrology

## 2023-10-14 ENCOUNTER — Other Ambulatory Visit: Payer: Self-pay

## 2023-10-14 DIAGNOSIS — E559 Vitamin D deficiency, unspecified: Secondary | ICD-10-CM | POA: Insufficient documentation

## 2023-10-14 DIAGNOSIS — N184 Chronic kidney disease, stage 4 (severe): Secondary | ICD-10-CM | POA: Insufficient documentation

## 2023-10-14 LAB — PROTEIN/CREATININE RATIO, URINE, RANDOM
CREATININE RANDOM URINE: 36 mg/dL (ref 30–125)
PROTEIN RANDOM URINE: 22 mg/dL — ABNORMAL LOW (ref 50–80)
PROTEIN/CREATININE RATIO RANDOM URINE: 0.611 mg/mg (ref 0.000–200.000)

## 2023-10-14 LAB — BASIC METABOLIC PANEL
ANION GAP: 11 mmol/L (ref 4–13)
BUN/CREA RATIO: 10 (ref 6–22)
BUN: 19 mg/dL (ref 7–25)
CALCIUM: 10.3 mg/dL (ref 8.6–10.3)
CHLORIDE: 108 mmol/L — ABNORMAL HIGH (ref 98–107)
CO2 TOTAL: 22 mmol/L (ref 21–31)
CREATININE: 1.88 mg/dL — ABNORMAL HIGH (ref 0.60–1.30)
ESTIMATED GFR: 27 mL/min/{1.73_m2} — ABNORMAL LOW (ref >59)
GLUCOSE: 107 mg/dL (ref 74–109)
OSMOLALITY, CALCULATED: 284 mosm/kg (ref 270–290)
POTASSIUM: 4 mmol/L (ref 3.5–5.1)
SODIUM: 141 mmol/L (ref 136–145)

## 2023-10-14 LAB — CBC WITH DIFF
BASOPHIL #: 0.1 10*3/uL (ref 0.00–0.10)
BASOPHIL %: 1 % (ref 0–1)
EOSINOPHIL #: 0.1 10*3/uL (ref 0.00–0.50)
EOSINOPHIL %: 1 % (ref 1–7)
HCT: 48.9 % — ABNORMAL HIGH (ref 31.2–41.9)
HGB: 16.3 g/dL — ABNORMAL HIGH (ref 10.9–14.3)
LYMPHOCYTE #: 2.4 10*3/uL (ref 1.10–3.10)
LYMPHOCYTE %: 23 % (ref 16–46)
MCH: 32.1 pg (ref 24.7–32.8)
MCHC: 33.3 g/dL (ref 32.3–35.6)
MCV: 96.2 fL — ABNORMAL HIGH (ref 75.5–95.3)
MONOCYTE #: 0.6 10*3/uL (ref 0.20–0.90)
MONOCYTE %: 6 % (ref 4–11)
MPV: 9.7 fL (ref 7.9–10.8)
NEUTROPHIL #: 7 10*3/uL (ref 1.90–8.20)
NEUTROPHIL %: 69 % (ref 43–77)
PLATELETS: 268 10*3/uL (ref 140–440)
RBC: 5.08 10*6/uL — ABNORMAL HIGH (ref 3.63–4.92)
RDW: 13.3 % (ref 12.3–17.7)
WBC: 10.2 10*3/uL (ref 3.8–11.8)

## 2023-10-14 LAB — MICROALBUMIN/CREATININE RATIO, URINE, RANDOM
CREATININE RANDOM URINE: 36 mg/dL (ref 30–125)
MICROALBUMIN RANDOM URINE: 2.9 mg/dL
MICROALBUMIN/CREATININE RATIO RANDOM URINE: 80.6 mg/g

## 2023-10-14 LAB — PARATHYROID HORMONE (PTH): PTH: 131.6 pg/mL — ABNORMAL HIGH (ref 12.0–88.0)

## 2023-10-14 LAB — VITAMIN D 25 TOTAL: VITAMIN D 25, TOTAL: 67.79 ng/mL (ref 30.00–100.00)

## 2023-10-14 LAB — MAGNESIUM: MAGNESIUM: 2.1 mg/dL (ref 1.9–2.7)

## 2023-10-14 LAB — URIC ACID: URIC ACID: 5.2 mg/dL (ref 2.3–7.6)

## 2023-10-21 ENCOUNTER — Other Ambulatory Visit: Payer: Self-pay

## 2023-10-21 ENCOUNTER — Encounter (INDEPENDENT_AMBULATORY_CARE_PROVIDER_SITE_OTHER): Payer: Self-pay | Admitting: Nephrology

## 2023-10-21 ENCOUNTER — Ambulatory Visit (INDEPENDENT_AMBULATORY_CARE_PROVIDER_SITE_OTHER): Payer: Self-pay | Admitting: Nephrology

## 2023-10-21 VITALS — BP 139/81 | HR 96 | Ht 62.0 in | Wt 150.0 lb

## 2023-10-21 DIAGNOSIS — N184 Chronic kidney disease, stage 4 (severe): Secondary | ICD-10-CM

## 2023-10-21 DIAGNOSIS — F17210 Nicotine dependence, cigarettes, uncomplicated: Secondary | ICD-10-CM

## 2023-10-21 DIAGNOSIS — E210 Primary hyperparathyroidism: Secondary | ICD-10-CM

## 2023-10-21 DIAGNOSIS — E559 Vitamin D deficiency, unspecified: Secondary | ICD-10-CM

## 2023-10-21 DIAGNOSIS — T56891A Toxic effect of other metals, accidental (unintentional), initial encounter: Secondary | ICD-10-CM

## 2023-10-21 DIAGNOSIS — I129 Hypertensive chronic kidney disease with stage 1 through stage 4 chronic kidney disease, or unspecified chronic kidney disease: Secondary | ICD-10-CM

## 2023-10-21 MED ORDER — EMPAGLIFLOZIN 10 MG TABLET
10.0000 mg | ORAL_TABLET | Freq: Every day | ORAL | 3 refills | Status: AC
Start: 2023-10-21 — End: 2024-10-20

## 2023-10-21 NOTE — Progress Notes (Signed)
 NE[facility]OLOGY[facility]  [address] [place] [address]       Name: Paula Cochran MRN:  [id number]   Date of Birth: Dec 17, 1945 Age: 78 y.o.   Date: 10/21/2023  Time: 14:08       Nephrology Office Note    Reason for visit: Follow Up (Follow up labs)      History of Present Illness:  Paula Cochran is a 78 y.o. female with past medical history of bipolar disorder was on lithium for many many years she has underlying chronic kidney disease stage 4.    Patient is here for follow-up.  Patient denies chest pain.  No shortness of breath.  Patient has edema. No difficulty urinating.  Patient remains on Jardiance.    LAB WORK HEMOGLOBIN 14.2 GLUCOSE 75 CREATININE 2.31 GFR 21 CHLORIDE 109 SODIUM 144 POTASSIUM 4.3 CALCIUM 9.6 TOTAL PROTEIN CREATININE RATIO 0.3 9, ALBUMIN TO CREATININE RATIO IS 39 VITAMIN-D 56 URIC ACID 6.9 MAGNESIUM 2.2 PTH 80    Past Medical History:  Past Medical History:   Diagnosis Date    Arthropathy     Asthma     Bipolar disorder, unspecified (CMS HCC)     Cancer (CMS HCC)     squamous cell    CKD (chronic kidney disease)     Wears glasses          Past Surgical History:  Past Surgical History:   Procedure Laterality Date    HX APPENDECTOMY      HX DILATION AND CURETTAGE      HX HYSTERECTOMY           Allergies:  Allergies   Allergen Reactions    Amoxicillin Anaphylaxis, Hives/ Urticaria and Shortness of Breath     Has patient had a PCN reaction causing immediate rash, facial/tongue/throat swelling, SOB or lightheadedness with hypotension: No   Has patient had a PCN reaction causing severe rash involving mucus membranes or skin necrosis: No   Has patient had a PCN reaction that required hospitalization No   Has patient had a PCN reaction occurring within the last 10 years: Unknown   If all of the above answers are "NO", then may proceed with Cephalosporin use.    Adhesive Rash, Itching and Hives/ Urticaria    Nickel Rash and Itching     Medications:  Current Outpatient  Medications   Medication Sig    albuterol sulfate (PROVENTIL OR VENTOLIN OR PROAIR) 90 mcg/actuation Inhalation oral inhaler Take 1-2 Puffs by inhalation Every 6 hours as needed    empagliflozin (JARDIANCE) 10 mg Oral Tablet Take 1 Tablet (10 mg total) by mouth Daily    lamoTRIgine (LAMICTAL) 200 mg Oral Tablet Take 1 Tablet (200 mg total) by mouth Once a day    LORazepam (ATIVAN) 1 mg Oral Tablet Take 1 Tablet (1 mg total) by mouth Every night     Family History:  Family Medical History:    None         Social History:  Social History     Socioeconomic History    Marital status: Married   Tobacco Use    Smoking status: Every Day     Current packs/day: 0.25     Average packs/day: 0.3 packs/day for 20.0 years (5.0 ttl pk-yrs)     Types: Cigarettes    Smokeless tobacco: Never   Vaping Use    Vaping status: Never Used   Substance and Sexual Activity    Alcohol  use: Never    Drug use: Never    Sexual activity: Yes     Social Determinants of Health     Social Connections: Low Risk  (04/24/2023)    Social Connections     SDOH Social Isolation: 5 or more times a week       Review of Systems:        Systematic review of 12 organ systems was negative except what mentioned in in the HPI.      Physical Exam:  Vitals:    10/21/23 1346   BP: 139/81   Pulse: 96   Weight: 68 kg (150 lb)   Height: 1.575 m (5\' 2" )   BMI: 27.44        Patient is alert awake and oriented not in acute distress.  Normal mood and affect.  Neck exam no JVD normal inspection.  Cardiovascular system: Regular rate and rhythm no murmurs rubs or gallops. No chest wall tenderness  Lungs: Clear to auscultation bilaterally no wheezing no rhonchi.  Abdomen soft nontender nondistended.  Extremities +1 edema  Neuro exam: EOMI, normal speech      Lab:  Lab Results   Component Value Date    BUN 19 10/14/2023    BUN 22 10/09/2023    BUN 36 (H) 04/30/2023    CREATININE 1.88 (H) 10/14/2023    CREATININE 1.90 (H) 10/09/2023    CREATININE 1.89 (H) 04/30/2023    BUNCRRATIO  10 10/14/2023    BUNCRRATIO 12 10/09/2023    BUNCRRATIO 19 04/30/2023    GFR 27 (L) 10/14/2023    GFR 27 (L) 10/09/2023    GFR 27 (L) 04/30/2023    SODIUM 141 10/14/2023    SODIUM 142 10/09/2023    SODIUM 141 04/30/2023    POTASSIUM 4.0 10/14/2023    POTASSIUM 4.1 10/09/2023    POTASSIUM 4.4 04/30/2023    CHLORIDE 108 (H) 10/14/2023    CHLORIDE 109 (H) 10/09/2023    CHLORIDE 114 (H) 04/30/2023    CO2 22 10/14/2023    CO2 24 10/09/2023    CO2 24 04/30/2023    ANIONGAP 11 10/14/2023    ANIONGAP 9 10/09/2023    ANIONGAP 3 (L) 04/30/2023    CALCIUM 10.3 10/14/2023    CALCIUM 9.9 10/09/2023    CALCIUM 9.4 04/30/2023    PHOSPHORUS 3.6 (L) 10/03/2022    ALBUMIN 4.4 10/09/2023    ALBUMIN 3.0 (L) 04/30/2023    ALBUMIN 2.6 (L) 04/29/2023    HGB 16.3 (H) 10/14/2023    HGB 15.9 (H) 10/09/2023    HGB 12.7 04/30/2023    HCT 48.9 (H) 10/14/2023    HCT 48.1 (H) 10/09/2023    HCT 37.9 04/30/2023    INTACTPTH 131.6 (H) 10/14/2023    INTACTPTH 49.2 03/17/2023    INTACTPTH 127.1 (H) 10/03/2022    IRON 169 10/03/2022    IRONBINDCAP 270 10/03/2022    IRONSAT 63 (H) 10/03/2022    FERRITIN 68 10/03/2022       Lab Results   Component Value Date    URICACID 5.2 10/14/2023    URICACID 4.9 03/17/2023    URICACID 8.2 (H) 10/03/2022        Assessment and Plan:  ENCOUNTER DIAGNOSES     ICD-10-CM   1. CKD (chronic kidney disease) stage 4, GFR 15-29 ml/min (CMS HCC)  N18.4   2. Vitamin D deficiency  E55.9   3. Primary hyperparathyroidism (CMS HCC)  E21.0  Chronic kidney disease  -Stage 4  -Due to chronic lithium toxicity.  Patient has stopped her lithium completely she is on Abilify and Lamictal.  -Creatinine is worse 2.3/GFR 21, creatinine 1.88 GFR 27 condition is better  -Baseline creatinine 1.8  -Total protein to creatinine ratio, 0.3, 0.6  -UACR 39, 80  -CBC and a basic metabolic panel  -Return to clinic in 4 months  -Continue low-sodium diet  -balanced Fluid intake 40-50 oz a day.    -Avoid NSAIDs.  Tylenol only for pain  -Renal  imaging MRI indicating no masses, follow up with Urology outpatient.  Renal ultrasound indicating medical renal disease.  -ACEI or ARBS:  no  -Sodium Glucose Cotransporter-2 (SGLT2) Inhibitors:  Continue Jardiance  -Lasix p.r.n.    CKD bone mineral disease/secondary hyperparathyroidism   -PTH elevated calcium is elevated, possible if primary hyperparathyroidism we will order nuclear scan  PTH   Date Value Ref Range Status   10/14/2023 131.6 (H) 12.0 - 88.0 pg/mL Final   -Vitamin D level at goal    Hypertension   -blood pressure is at goal  -continue to monitor    Orders Placed This Encounter    NUC PARATHYROID IMAGE W SPECT    BASIC METABOLIC PANEL    CBC/DIFF    MAGNESIUM    MICROALBUMIN/CREATININE RATIO, URINE, RANDOM    PARATHYROID HORMONE (PTH)    PROTEIN/CREATININE RATIO, URINE, RANDOM    URIC ACID    VITAMIN D 25 TOTAL    empagliflozin (JARDIANCE) 10 mg Oral Tablet

## 2023-10-27 ENCOUNTER — Ambulatory Visit
Admission: RE | Admit: 2023-10-27 | Discharge: 2023-10-27 | Disposition: A | Discharge status: Home / Self Care | Payer: Commercial Managed Care - PPO | Source: Ambulatory Visit | Attending: Family | Admitting: Family

## 2023-10-27 ENCOUNTER — Other Ambulatory Visit: Payer: Self-pay

## 2023-10-27 DIAGNOSIS — R935 Abnormal findings on diagnostic imaging of other abdominal regions, including retroperitoneum: Secondary | ICD-10-CM | POA: Insufficient documentation

## 2023-10-28 DIAGNOSIS — K769 Liver disease, unspecified: Secondary | ICD-10-CM

## 2023-10-28 DIAGNOSIS — K8020 Calculus of gallbladder without cholecystitis without obstruction: Secondary | ICD-10-CM

## 2023-10-28 DIAGNOSIS — R93421 Abnormal radiologic findings on diagnostic imaging of right kidney: Secondary | ICD-10-CM

## 2023-10-28 DIAGNOSIS — R935 Abnormal findings on diagnostic imaging of other abdominal regions, including retroperitoneum: Secondary | ICD-10-CM

## 2023-12-30 ENCOUNTER — Other Ambulatory Visit: Payer: Self-pay

## 2023-12-30 ENCOUNTER — Ambulatory Visit
Admission: RE | Admit: 2023-12-30 | Discharge: 2023-12-30 | Disposition: A | Discharge status: Home / Self Care | Payer: Self-pay | Source: Ambulatory Visit | Attending: Student in an Organized Health Care Education/Training Program | Admitting: Student in an Organized Health Care Education/Training Program

## 2023-12-30 DIAGNOSIS — N281 Cyst of kidney, acquired: Secondary | ICD-10-CM | POA: Insufficient documentation

## 2023-12-30 DIAGNOSIS — N261 Atrophy of kidney (terminal): Secondary | ICD-10-CM

## 2023-12-30 DIAGNOSIS — N179 Acute kidney failure, unspecified: Secondary | ICD-10-CM

## 2024-01-13 ENCOUNTER — Ambulatory Visit (INDEPENDENT_AMBULATORY_CARE_PROVIDER_SITE_OTHER): Payer: Self-pay | Admitting: Physician Assistant

## 2024-01-13 ENCOUNTER — Other Ambulatory Visit: Payer: Self-pay

## 2024-01-13 VITALS — BP 146/68 | HR 86 | Ht 62.0 in | Wt 142.2 lb

## 2024-01-13 DIAGNOSIS — N289 Disorder of kidney and ureter, unspecified: Secondary | ICD-10-CM

## 2024-01-13 DIAGNOSIS — N281 Cyst of kidney, acquired: Secondary | ICD-10-CM

## 2024-01-13 NOTE — Progress Notes (Signed)
 [facility]OLOGY, [facility]  [address] [place] [address]    Progress Note    Name: Paula Cochran MRN:  [id number]   Date: 01/13/2024 DOB:  [birth year] (77 y.o.)             Chief Complaint: Follow Up (F/U on US  (12/30/23))  Subjective   Subjective:   Paula Cochran is a pleasant 78 year old female with a history of chronic kidney disease stage 4.  In evaluation of her CKD she recently underwent a renal ultrasound.  Renal US  12/30/23 reports bilateral atrophic kidneys. No definite renal cyst seen. No renal masses noted. No hydronephrosis. She denies any changes in interval. He denies fever, chills, suprapubic pain, nausea, vomiting, flank pain, dysuria, or hematuria.         Objective   Objective :  BP (!) 146/68 (Site: Right Arm, Patient Position: Sitting, Cuff Size: Adult)   Pulse 86   Ht 1.575 m (5' 2)   Wt 64.5 kg (142 lb 3.2 oz)   BMI 26.01 kg/m       Gen: NAD, alert  Pulm: unlabored at rest  CV: palpable pulses  Abd: soft, Nt/ND  GU: no suprapubic tenderness, no CVAT      Data reviewed:    Current Outpatient Medications   Medication Sig    albuterol  sulfate (PROVENTIL  OR VENTOLIN  OR PROAIR ) 90 mcg/actuation Inhalation oral inhaler Take 1-2 Puffs by inhalation Every 6 hours as needed    empagliflozin  (JARDIANCE ) 10 mg Oral Tablet Take 1 Tablet (10 mg total) by mouth Daily    lamoTRIgine  (LAMICTAL ) 200 mg Oral Tablet Take 1 Tablet (200 mg total) by mouth Once a day    LORazepam  (ATIVAN ) 1 mg Oral Tablet Take 1 Tablet (1 mg total) by mouth Every night        Assessment/Plan  Problem List Items Addressed This Visit    None      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  We spent  considerable time personally reviewing relevant imaging which are consistent with a Bosniak I/II renal cysts.  I personally reviewed the patient's most current CT scan images as well as her previous MRI and ultrasound images and personally interpreted these findings.  I demonstrated these images to the patient.   I discussed the nature of incompletely characterized renal cyst warranting further dedicated imaging for further lesion characterization  I discussed the nature of simple appearing Bosniak I renal cyst which have been reported to affect more than 50% of patients over the age of 56 and warrant no further workup, surveillance or intervention if asymptomatic; however, repeat ultrasonography may be considered at 6 to 12 months in selected patients in order to confirm stability and a correct diagnosis  I discussed the nature of minimally complex Bosniak II renal cyst which pose limited oncologic potential and generally do not warrant further radiographic surveillance; however, repeat ultrasonography may be considered at 6 to 12 months in selected patients in order to confirm stability and a correct diagnosis  I discussed the nature of complex Bosniak IIF renal cyst which has a reported incidence of malignancy ranging from 5-20% and accordingly warrants further radiographic surveillance  I discussed the nature of complex  Bosniak III renal cyst which carries nearly a 50% risk of malignancy and warrants surgical extirpation  I discussed the nature of complex Bosniak IV renal cyst which carries nearly a 90% risk of malignancy and warrants surgical extirpation  Based on patient's clinical and radiographic findings, I would recommend renal US  in one year  Generally the indications for surgical intervention for renal cysts include suspected malignancy, pain attributable to cyst expansion, cyst infection and angiotensin-dependent hypertension.  We also discussed the limited role of percutaneous renal fine-needle  aspiration (FNA) biopsy; unfortunately, biopsy cannot reliably distinguish a benign renal cyst from the less common cystic renal cell carcinoma.  Preoperative needle biopsy has generally not been recommended for resectable renal lesions, because of concern about seeding of the peritoneum.          Chronic kidney disease, stage 4  Following with Nephrology.  Recommendations appreciated          A combined total of 30 minutes were spent preparing to see the patient, reviewing previous records, ordering tests/medications/procedures, documenting the clinical encounter as well as performing a medically appropriate evaluation and independently interpreting results and communicating them to the patient/family/caregiver as specifically outlined above in the impression and plan.

## 2024-02-11 ENCOUNTER — Other Ambulatory Visit (INDEPENDENT_AMBULATORY_CARE_PROVIDER_SITE_OTHER): Payer: Self-pay

## 2024-02-18 ENCOUNTER — Encounter (INDEPENDENT_AMBULATORY_CARE_PROVIDER_SITE_OTHER): Payer: Self-pay | Admitting: Nephrology

## 2024-03-08 ENCOUNTER — Ambulatory Visit (INDEPENDENT_AMBULATORY_CARE_PROVIDER_SITE_OTHER)

## 2024-03-08 ENCOUNTER — Other Ambulatory Visit: Payer: Self-pay

## 2024-03-08 ENCOUNTER — Other Ambulatory Visit: Attending: Nephrology | Admitting: Nephrology

## 2024-03-08 DIAGNOSIS — N184 Chronic kidney disease, stage 4 (severe): Secondary | ICD-10-CM | POA: Insufficient documentation

## 2024-03-08 DIAGNOSIS — E559 Vitamin D deficiency, unspecified: Secondary | ICD-10-CM | POA: Insufficient documentation

## 2024-03-08 LAB — CBC WITH DIFF
BASOPHIL #: 0.1 x10ˆ3/uL (ref 0.00–0.10)
BASOPHIL %: 1 % (ref 0–1)
EOSINOPHIL #: 0.8 x10ˆ3/uL — ABNORMAL HIGH (ref 0.00–0.50)
EOSINOPHIL %: 13 % — ABNORMAL HIGH (ref 1–7)
HCT: 43 % — ABNORMAL HIGH (ref 31.2–41.9)
HGB: 14.4 g/dL — ABNORMAL HIGH (ref 10.9–14.3)
LYMPHOCYTE #: 2.4 x10ˆ3/uL (ref 1.10–3.10)
LYMPHOCYTE %: 39 % (ref 16–46)
MCH: 33.1 pg — ABNORMAL HIGH (ref 24.7–32.8)
MCHC: 33.6 g/dL (ref 32.3–35.6)
MCV: 98.5 fL — ABNORMAL HIGH (ref 75.5–95.3)
MONOCYTE #: 0.4 x10ˆ3/uL (ref 0.20–0.90)
MONOCYTE %: 6 % (ref 4–11)
MPV: 9 fL (ref 7.9–10.8)
NEUTROPHIL #: 2.5 x10ˆ3/uL (ref 1.90–8.20)
NEUTROPHIL %: 42 % — ABNORMAL LOW (ref 43–77)
PLATELETS: 224 x10ˆ3/uL (ref 140–440)
RBC: 4.37 x10ˆ6/uL (ref 3.63–4.92)
RDW: 13.5 % (ref 12.3–17.7)
WBC: 6 x10ˆ3/uL (ref 3.8–11.8)

## 2024-03-08 LAB — BASIC METABOLIC PANEL
ANION GAP: 6 mmol/L (ref 4–13)
BUN/CREA RATIO: 15 (ref 6–22)
BUN: 31 mg/dL — ABNORMAL HIGH (ref 7–25)
CALCIUM: 9.7 mg/dL (ref 8.6–10.3)
CHLORIDE: 111 mmol/L — ABNORMAL HIGH (ref 98–107)
CO2 TOTAL: 25 mmol/L (ref 21–31)
CREATININE: 2.01 mg/dL — ABNORMAL HIGH (ref 0.60–1.30)
ESTIMATED GFR: 25 mL/min/1.73mˆ2 — ABNORMAL LOW (ref >59)
GLUCOSE: 92 mg/dL (ref 74–109)
OSMOLALITY, CALCULATED: 289 mosm/kg (ref 270–290)
POTASSIUM: 5.3 mmol/L — ABNORMAL HIGH (ref 3.5–5.1)
SODIUM: 142 mmol/L (ref 136–145)

## 2024-03-08 LAB — PROTEIN/CREATININE RATIO, URINE, RANDOM
CREATININE RANDOM URINE: 22 mg/dL — ABNORMAL LOW (ref 30–125)
PROTEIN RANDOM URINE: 13 mg/dL — ABNORMAL LOW (ref 50–80)
PROTEIN/CREATININE RATIO RANDOM URINE: 0.591 mg/mg (ref 0.000–200.000)

## 2024-03-08 LAB — VITAMIN D 25 TOTAL: VITAMIN D 25, TOTAL: 49.62 ng/mL (ref 30.00–100.00)

## 2024-03-08 LAB — MICROALBUMIN/CREATININE RATIO, URINE, RANDOM
CREATININE RANDOM URINE: 22 mg/dL — ABNORMAL LOW (ref 30–125)
MICROALBUMIN RANDOM URINE: 0.8 mg/dL
MICROALBUMIN/CREATININE RATIO RANDOM URINE: 36.4 mg/g

## 2024-03-08 LAB — PARATHYROID HORMONE (PTH): PTH: 139.6 pg/mL — ABNORMAL HIGH (ref 12.0–88.0)

## 2024-03-08 LAB — URIC ACID: URIC ACID: 5.1 mg/dL (ref 2.3–7.6)

## 2024-03-08 LAB — MAGNESIUM: MAGNESIUM: 2.4 mg/dL (ref 1.9–2.7)

## 2024-03-10 ENCOUNTER — Other Ambulatory Visit (HOSPITAL_COMMUNITY): Payer: Self-pay | Admitting: FAMILY PRACTICE

## 2024-03-10 ENCOUNTER — Encounter (INDEPENDENT_AMBULATORY_CARE_PROVIDER_SITE_OTHER): Payer: Self-pay | Admitting: Surgery

## 2024-03-10 DIAGNOSIS — Z1231 Encounter for screening mammogram for malignant neoplasm of breast: Secondary | ICD-10-CM

## 2024-03-16 ENCOUNTER — Encounter (INDEPENDENT_AMBULATORY_CARE_PROVIDER_SITE_OTHER): Payer: Self-pay | Admitting: Nephrology

## 2024-03-16 ENCOUNTER — Ambulatory Visit (INDEPENDENT_AMBULATORY_CARE_PROVIDER_SITE_OTHER): Admitting: Nephrology

## 2024-03-16 ENCOUNTER — Other Ambulatory Visit: Payer: Self-pay

## 2024-03-16 VITALS — BP 145/129 | HR 96 | Ht 62.0 in | Wt 149.0 lb

## 2024-03-16 DIAGNOSIS — E875 Hyperkalemia: Secondary | ICD-10-CM

## 2024-03-16 DIAGNOSIS — E210 Primary hyperparathyroidism: Secondary | ICD-10-CM

## 2024-03-16 DIAGNOSIS — N184 Chronic kidney disease, stage 4 (severe): Secondary | ICD-10-CM

## 2024-03-16 DIAGNOSIS — E559 Vitamin D deficiency, unspecified: Secondary | ICD-10-CM

## 2024-03-16 DIAGNOSIS — N2581 Secondary hyperparathyroidism of renal origin: Secondary | ICD-10-CM

## 2024-03-16 DIAGNOSIS — I129 Hypertensive chronic kidney disease with stage 1 through stage 4 chronic kidney disease, or unspecified chronic kidney disease: Secondary | ICD-10-CM

## 2024-03-16 DIAGNOSIS — T56891A Toxic effect of other metals, accidental (unintentional), initial encounter: Secondary | ICD-10-CM

## 2024-03-16 DIAGNOSIS — R600 Localized edema: Secondary | ICD-10-CM

## 2024-03-16 DIAGNOSIS — Z8659 Personal history of other mental and behavioral disorders: Secondary | ICD-10-CM

## 2024-03-16 MED ORDER — FUROSEMIDE 40 MG TABLET
40.0000 mg | ORAL_TABLET | Freq: Every day | ORAL | Status: AC | PRN
Start: 2024-03-16 — End: ?

## 2024-03-16 NOTE — Progress Notes (Signed)
 NEPHROLOGY, [facility]  [address]  [place] [address]       Name: Paula Cochran MRN:  [id number]   Date of Birth: [birth year] Age: 78 y.o.   Date: 03/16/2024  Time: 16:05       Nephrology Office Note    Reason for visit: Follow Up (Follow up labs)      History of Present Illness:  Paula Cochran is a 78 y.o. female with past medical history of bipolar disorder was on lithium  for many many years she has underlying chronic kidney disease stage 4.    Patient is here for follow-up.  Patient denies chest pain.  No shortness of breath.  Patient has worsening edema. No difficulty urinating.  Patient remains on Jardiance .  Not on lithium  anymore      LAB WORK HEMOGLOBIN 14.2 GLUCOSE 75 CREATININE 2.31 GFR 21 CHLORIDE 109 SODIUM 144 POTASSIUM 4.3 CALCIUM 9.6 TOTAL PROTEIN CREATININE RATIO 0.3 9, ALBUMIN TO CREATININE RATIO IS 39 VITAMIN-D 56 URIC ACID 6.9 MAGNESIUM  2.2 PTH 80    Past Medical History:  Past Medical History:   Diagnosis Date    Arthropathy     Asthma     Bipolar disorder, unspecified     Cancer (CMS HCC)     squamous cell    CKD (chronic kidney disease)     Wears glasses          Past Surgical History:  Past Surgical History:   Procedure Laterality Date    HX APPENDECTOMY      HX DILATION AND CURETTAGE      HX HYSTERECTOMY           Allergies:  Allergies   Allergen Reactions    Amoxicillin Anaphylaxis, Hives/ Urticaria and Shortness of Breath     Has patient had a PCN reaction causing immediate rash, facial/tongue/throat swelling, SOB or lightheadedness with hypotension: No   Has patient had a PCN reaction causing severe rash involving mucus membranes or skin necrosis: No   Has patient had a PCN reaction that required hospitalization No   Has patient had a PCN reaction occurring within the last 10 years: Unknown   If all of the above answers are NO, then may proceed with Cephalosporin use.    Adhesive Rash, Itching and Hives/ Urticaria    Nickel Rash and Itching      Medications:  Current Outpatient Medications   Medication Sig    albuterol  sulfate (PROVENTIL  OR VENTOLIN  OR PROAIR ) 90 mcg/actuation Inhalation oral inhaler Take 1-2 Puffs by inhalation Every 6 hours as needed    empagliflozin  (JARDIANCE ) 10 mg Oral Tablet Take 1 Tablet (10 mg total) by mouth Daily    furosemide  (LASIX ) 40 mg Oral Tablet Take 1 Tablet (40 mg total) by mouth Once per day as needed (edema)    lamoTRIgine  (LAMICTAL ) 200 mg Oral Tablet Take 1 Tablet (200 mg total) by mouth Once a day    LORazepam  (ATIVAN ) 1 mg Oral Tablet Take 1 Tablet (1 mg total) by mouth Every night     Family History:  Family Medical History:    None         Social History:  Social History     Socioeconomic History    Marital status: Married   Tobacco Use    Smoking status: Every Day     Current packs/day: 0.25     Average packs/day: 0.3 packs/day for 20.0 years (5.0 ttl pk-yrs)  Types: Cigarettes    Smokeless tobacco: Never   Vaping Use    Vaping status: Never Used   Substance and Sexual Activity    Alcohol use: Never    Drug use: Never    Sexual activity: Yes     Social Determinants of Health     Social Connections: Low Risk  (04/24/2023)    Social Connections     SDOH Social Isolation: 5 or more times a week       Review of Systems:        Systematic review of 12 organ systems was negative except what mentioned in in the HPI.      Physical Exam:  Vitals:    03/16/24 1514   BP: (!) 145/129   Pulse: 96   Weight: 67.6 kg (149 lb)   Height: 1.575 m (5' 2)   BMI: 27.25        Patient is alert awake and oriented not in acute distress.  Normal mood and affect.  Neck exam no JVD normal inspection.  Cardiovascular system: Regular rate and rhythm no murmurs rubs or gallops. No chest wall tenderness  Lungs: Clear to auscultation bilaterally no wheezing no rhonchi.  Abdomen soft nontender nondistended.  Extremities +3  edema  Neuro exam: EOMI, normal speech      Lab:  Lab Results   Component Value Date    BUN 31 (H) 03/08/2024    BUN  19 10/14/2023    BUN 22 10/09/2023    CREATININE 2.01 (H) 03/08/2024    CREATININE 1.88 (H) 10/14/2023    CREATININE 1.90 (H) 10/09/2023    BUNCRRATIO 15 03/08/2024    BUNCRRATIO 10 10/14/2023    BUNCRRATIO 12 10/09/2023    GFR 25 (L) 03/08/2024    GFR 27 (L) 10/14/2023    GFR 27 (L) 10/09/2023    SODIUM 142 03/08/2024    SODIUM 141 10/14/2023    SODIUM 142 10/09/2023    POTASSIUM 5.3 (H) 03/08/2024    POTASSIUM 4.0 10/14/2023    POTASSIUM 4.1 10/09/2023    CHLORIDE 111 (H) 03/08/2024    CHLORIDE 108 (H) 10/14/2023    CHLORIDE 109 (H) 10/09/2023    CO2 25 03/08/2024    CO2 22 10/14/2023    CO2 24 10/09/2023    ANIONGAP 6 03/08/2024    ANIONGAP 11 10/14/2023    ANIONGAP 9 10/09/2023    CALCIUM 9.7 03/08/2024    CALCIUM 10.3 10/14/2023    CALCIUM 9.9 10/09/2023    PHOSPHORUS 3.6 (L) 10/03/2022    ALBUMIN 4.4 10/09/2023    ALBUMIN 3.0 (L) 04/30/2023    ALBUMIN 2.6 (L) 04/29/2023    HGB 14.4 (H) 03/08/2024    HGB 16.3 (H) 10/14/2023    HGB 15.9 (H) 10/09/2023    HCT 43.0 (H) 03/08/2024    HCT 48.9 (H) 10/14/2023    HCT 48.1 (H) 10/09/2023    INTACTPTH 139.6 (H) 03/08/2024    INTACTPTH 131.6 (H) 10/14/2023    INTACTPTH 49.2 03/17/2023    IRON 169 10/03/2022    IRONBINDCAP 270 10/03/2022    IRONSAT 63 (H) 10/03/2022    FERRITIN 68 10/03/2022       Lab Results   Component Value Date    URICACID 5.1 03/08/2024    URICACID 5.2 10/14/2023    URICACID 4.9 03/17/2023        Assessment and Plan:  ENCOUNTER DIAGNOSES     ICD-10-CM   1. CKD (chronic kidney disease) stage 4,  GFR 15-29 ml/min (CMS HCC)  N18.4   2. 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 Hyperkalemia  E87.5              Chronic kidney disease  -Stage 4  -Due to chronic lithium  toxicity.  Patient has stopped her lithium  completely she is on Abilify  and Lamictal .  -Creatinine is worse 2.3/GFR 21, creatinine 1.88 GFR 27 , creatinine 2.0 GFR 25 stable  -Baseline creatinine 1.8 -2.0  -Total protein to creatinine ratio, 0.3, 0.6, 0.6  -UACR 39, 80, 36  -CBC and a basic metabolic  panel  -Return to clinic in 3-4 months  -Continue low-sodium diet  -balanced Fluid intake 40-50 oz a day.    -Avoid NSAIDs.  Tylenol  only for pain  -Renal imaging MRI indicating no masses, follow up with Urology outpatient.  Renal ultrasound indicating medical renal disease.  -ACEI or ARBS:  no  -Sodium Glucose Cotransporter-2 (SGLT2) Inhibitors:  Continue Jardiance   -Lasix  p.r.n., patient was instructed to take Lasix  40 mg for 5 days due to edema and high potassium.  No need for Lokelma repeating potassium for now    CKD bone mineral disease/secondary hyperparathyroidism   -PTH elevated calcium is elevated, possible if primary hyperparathyroidism we will order nuclear scan.  PTH   Date Value Ref Range Status   03/08/2024 139.6 (H) 12.0 - 88.0 pg/mL Final   -Vitamin D  level at goal    Hypertension   -blood pressure is at goal  -continue to monitor    Edema  -fluid restriction less than 45 oz of water a day and Lasix  40 mg for 5 days.    Hyperkalemia mild  -Lasix  as above continue to monitor not to take any potassium supplements.  Low-potassium diet    Orders Placed This Encounter    BASIC METABOLIC PANEL    CBC/DIFF    MAGNESIUM     MICROALBUMIN/CREATININE RATIO, URINE, RANDOM    PARATHYROID  HORMONE (PTH)    PROTEIN/CREATININE RATIO, URINE, RANDOM    URIC ACID    VITAMIN D  25 TOTAL    BASIC METABOLIC PANEL    furosemide  (LASIX ) 40 mg Oral Tablet

## 2024-03-17 NOTE — Addendum Note (Signed)
 Addended by: LANG BOWENS on: 03/17/2024 10:49 AM     Modules accepted: Orders

## 2024-03-22 ENCOUNTER — Other Ambulatory Visit (INDEPENDENT_AMBULATORY_CARE_PROVIDER_SITE_OTHER): Payer: Self-pay

## 2024-03-22 ENCOUNTER — Ambulatory Visit (HOSPITAL_COMMUNITY): Payer: Self-pay

## 2024-04-09 ENCOUNTER — Other Ambulatory Visit (HOSPITAL_COMMUNITY): Payer: Self-pay | Admitting: Family

## 2024-04-09 DIAGNOSIS — K7460 Unspecified cirrhosis of liver: Secondary | ICD-10-CM

## 2024-05-11 ENCOUNTER — Ambulatory Visit: Attending: Internal Medicine | Admitting: Internal Medicine

## 2024-05-11 DIAGNOSIS — K22719 Barrett's esophagus with dysplasia, unspecified: Secondary | ICD-10-CM | POA: Insufficient documentation

## 2024-05-11 DIAGNOSIS — Z8711 Personal history of peptic ulcer disease: Secondary | ICD-10-CM | POA: Insufficient documentation

## 2024-05-11 DIAGNOSIS — R1310 Dysphagia, unspecified: Secondary | ICD-10-CM | POA: Insufficient documentation

## 2024-05-11 DIAGNOSIS — R634 Abnormal weight loss: Secondary | ICD-10-CM | POA: Insufficient documentation

## 2024-05-13 ENCOUNTER — Encounter (HOSPITAL_COMMUNITY): Payer: Self-pay

## 2024-05-13 ENCOUNTER — Other Ambulatory Visit (HOSPITAL_COMMUNITY): Payer: Self-pay | Admitting: Family

## 2024-05-13 DIAGNOSIS — K2282 Esophagogastric junction polyp: Secondary | ICD-10-CM

## 2024-05-13 DIAGNOSIS — R933 Abnormal findings on diagnostic imaging of other parts of digestive tract: Secondary | ICD-10-CM

## 2024-05-14 ENCOUNTER — Other Ambulatory Visit: Payer: Self-pay

## 2024-05-17 DIAGNOSIS — K2950 Unspecified chronic gastritis without bleeding: Secondary | ICD-10-CM

## 2024-05-17 DIAGNOSIS — K9289 Other specified diseases of the digestive system: Secondary | ICD-10-CM

## 2024-05-17 DIAGNOSIS — C160 Malignant neoplasm of cardia: Secondary | ICD-10-CM

## 2024-05-17 LAB — SURGICAL PATHOLOGY SPECIMEN

## 2024-05-31 ENCOUNTER — Ambulatory Visit (HOSPITAL_COMMUNITY)
Admission: RE | Admit: 2024-05-31 | Discharge: 2024-05-31 | Disposition: A | Discharge status: Home / Self Care | Payer: Self-pay | Source: Ambulatory Visit | Attending: Nephrology | Admitting: Nephrology

## 2024-05-31 ENCOUNTER — Ambulatory Visit
Admission: RE | Admit: 2024-05-31 | Discharge: 2024-05-31 | Disposition: A | Discharge status: Home / Self Care | Payer: Self-pay | Source: Ambulatory Visit | Attending: Nephrology | Admitting: Nephrology

## 2024-05-31 ENCOUNTER — Other Ambulatory Visit: Payer: Self-pay

## 2024-05-31 DIAGNOSIS — E210 Primary hyperparathyroidism: Secondary | ICD-10-CM | POA: Insufficient documentation

## 2024-06-16 ENCOUNTER — Ambulatory Visit (INDEPENDENT_AMBULATORY_CARE_PROVIDER_SITE_OTHER): Payer: Self-pay

## 2024-06-16 ENCOUNTER — Other Ambulatory Visit: Attending: Nephrology | Admitting: Nephrology

## 2024-06-16 ENCOUNTER — Other Ambulatory Visit: Payer: Self-pay

## 2024-06-16 DIAGNOSIS — E559 Vitamin D deficiency, unspecified: Secondary | ICD-10-CM | POA: Insufficient documentation

## 2024-06-16 DIAGNOSIS — N184 Chronic kidney disease, stage 4 (severe): Secondary | ICD-10-CM | POA: Insufficient documentation

## 2024-06-16 DIAGNOSIS — E875 Hyperkalemia: Secondary | ICD-10-CM

## 2024-06-16 LAB — BASIC METABOLIC PANEL
ANION GAP: 11 mmol/L (ref 4–13)
BUN/CREA RATIO: 13 (ref 6–22)
BUN: 31 mg/dL — ABNORMAL HIGH (ref 7–25)
CALCIUM: 9.4 mg/dL (ref 8.6–10.3)
CHLORIDE: 104 mmol/L (ref 98–107)
CO2 TOTAL: 26 mmol/L (ref 21–31)
CREATININE: 2.34 mg/dL — ABNORMAL HIGH (ref 0.60–1.30)
ESTIMATED GFR: 21 mL/min/1.73mˆ2 — ABNORMAL LOW (ref >59)
GLUCOSE: 92 mg/dL (ref 74–109)
OSMOLALITY, CALCULATED: 288 mosm/kg (ref 270–290)
POTASSIUM: 4 mmol/L (ref 3.5–5.1)
SODIUM: 141 mmol/L (ref 136–145)

## 2024-06-16 LAB — CBC WITH DIFF
BASOPHIL #: 0 x10ˆ3/uL (ref 0.00–0.10)
BASOPHIL %: 1 % (ref 0–1)
EOSINOPHIL #: 0.3 x10ˆ3/uL (ref 0.00–0.50)
EOSINOPHIL %: 6 % (ref 1–7)
HCT: 44.4 % — ABNORMAL HIGH (ref 31.2–41.9)
HGB: 15.1 g/dL — ABNORMAL HIGH (ref 10.9–14.3)
LYMPHOCYTE #: 2 x10ˆ3/uL (ref 1.10–3.10)
LYMPHOCYTE %: 34 % (ref 16–46)
MCH: 32.8 pg (ref 24.7–32.8)
MCHC: 34 g/dL (ref 32.3–35.6)
MCV: 96.4 fL — ABNORMAL HIGH (ref 75.5–95.3)
MONOCYTE #: 0.9 x10ˆ3/uL (ref 0.20–0.90)
MONOCYTE %: 15 % — ABNORMAL HIGH (ref 4–11)
MPV: 9.1 fL (ref 7.9–10.8)
NEUTROPHIL #: 2.5 x10ˆ3/uL (ref 1.90–8.20)
NEUTROPHIL %: 44 % (ref 43–77)
PLATELETS: 264 x10ˆ3/uL (ref 140–440)
RBC: 4.6 x10ˆ6/uL (ref 3.63–4.92)
RDW: 13.1 % (ref 12.3–17.7)
WBC: 5.7 x10ˆ3/uL (ref 3.8–11.8)

## 2024-06-16 LAB — VITAMIN D 25 TOTAL: VITAMIN D 25, TOTAL: 48.66 ng/mL (ref 30.00–100.00)

## 2024-06-16 LAB — PROTEIN/CREATININE RATIO, URINE, RANDOM
CREATININE RANDOM URINE: 42 mg/dL (ref 30–125)
PROTEIN RANDOM URINE: 24 mg/dL — ABNORMAL LOW (ref 50–80)
PROTEIN/CREATININE RATIO RANDOM URINE: 0.571 mg/mg (ref 0.000–200.000)

## 2024-06-16 LAB — MICROALBUMIN/CREATININE RATIO, URINE, RANDOM
CREATININE RANDOM URINE: 42 mg/dL (ref 30–125)
MICROALBUMIN RANDOM URINE: 1.6 mg/dL
MICROALBUMIN/CREATININE RATIO RANDOM URINE: 38.1 mg/g

## 2024-06-16 LAB — URIC ACID: URIC ACID: 6.5 mg/dL (ref 2.3–7.6)

## 2024-06-16 LAB — MAGNESIUM: MAGNESIUM: 2.8 mg/dL — ABNORMAL HIGH (ref 1.9–2.7)

## 2024-06-16 LAB — PARATHYROID HORMONE (PTH): PTH: 248 pg/mL — ABNORMAL HIGH (ref 12.0–88.0)

## 2024-06-18 ENCOUNTER — Other Ambulatory Visit: Payer: Self-pay

## 2024-06-18 ENCOUNTER — Emergency Department (HOSPITAL_COMMUNITY)

## 2024-06-18 ENCOUNTER — Encounter (HOSPITAL_COMMUNITY): Payer: Self-pay

## 2024-06-18 ENCOUNTER — Emergency Department
Admission: EM | Admit: 2024-06-18 | Discharge: 2024-06-18 | Disposition: A | Discharge status: Home / Self Care | Attending: Nurse Practitioner | Admitting: Nurse Practitioner

## 2024-06-18 DIAGNOSIS — W0110XA Fall on same level from slipping, tripping and stumbling with subsequent striking against unspecified object, initial encounter: Secondary | ICD-10-CM | POA: Insufficient documentation

## 2024-06-18 DIAGNOSIS — M79641 Pain in right hand: Secondary | ICD-10-CM

## 2024-06-18 DIAGNOSIS — M47812 Spondylosis without myelopathy or radiculopathy, cervical region: Secondary | ICD-10-CM | POA: Insufficient documentation

## 2024-06-18 DIAGNOSIS — S62316A Displaced fracture of base of fifth metacarpal bone, right hand, initial encounter for closed fracture: Secondary | ICD-10-CM

## 2024-06-18 DIAGNOSIS — W19XXXA Unspecified fall, initial encounter: Secondary | ICD-10-CM

## 2024-06-18 DIAGNOSIS — S61512A Laceration without foreign body of left wrist, initial encounter: Secondary | ICD-10-CM

## 2024-06-18 DIAGNOSIS — W010XXA Fall on same level from slipping, tripping and stumbling without subsequent striking against object, initial encounter: Secondary | ICD-10-CM

## 2024-06-18 DIAGNOSIS — M25531 Pain in right wrist: Secondary | ICD-10-CM

## 2024-06-18 DIAGNOSIS — S60211A Contusion of right wrist, initial encounter: Secondary | ICD-10-CM

## 2024-06-18 DIAGNOSIS — S0083XA Contusion of other part of head, initial encounter: Secondary | ICD-10-CM

## 2024-06-18 DIAGNOSIS — S61411A Laceration without foreign body of right hand, initial encounter: Secondary | ICD-10-CM

## 2024-06-18 DIAGNOSIS — S63501A Unspecified sprain of right wrist, initial encounter: Secondary | ICD-10-CM | POA: Insufficient documentation

## 2024-06-18 DIAGNOSIS — Y9301 Activity, walking, marching and hiking: Secondary | ICD-10-CM | POA: Insufficient documentation

## 2024-06-18 DIAGNOSIS — S63509A Unspecified sprain of unspecified wrist, initial encounter: Secondary | ICD-10-CM

## 2024-06-18 DIAGNOSIS — S0003XA Contusion of scalp, initial encounter: Secondary | ICD-10-CM

## 2024-06-18 MED ORDER — FENTANYL (PF) 50 MCG/ML INJECTION SOLUTION
25.0000 ug | INTRAMUSCULAR | Status: AC
Start: 2024-06-18 — End: 2024-06-18
  Administered 2024-06-18: 25 ug via INTRAVENOUS

## 2024-06-18 MED ORDER — SODIUM CHLORIDE 0.9 % INTRAVENOUS SOLUTION
2.0000 g | INTRAVENOUS | Status: DC
Start: 2024-06-18 — End: 2024-06-18

## 2024-06-18 MED ORDER — FENTANYL (PF) 50 MCG/ML INJECTION SOLUTION
INTRAMUSCULAR | Status: AC
Start: 2024-06-18 — End: 2024-06-18
  Filled 2024-06-18: qty 2

## 2024-06-18 MED ORDER — ACETAMINOPHEN 500 MG TABLET: 500.0000 mg | ORAL_TABLET | Freq: Four times a day (QID) | ORAL | 0 refills | Status: DC | PRN

## 2024-06-18 MED ORDER — SODIUM CHLORIDE 0.9 % (FLUSH) INJECTION SYRINGE
3.0000 mL | INJECTION | Freq: Three times a day (TID) | INTRAMUSCULAR | Status: DC
Start: 2024-06-18 — End: 2024-06-18
  Administered 2024-06-18: 3 mL

## 2024-06-18 MED ORDER — SODIUM CHLORIDE 0.9% FLUSH BAG - 250 ML
INTRAVENOUS | Status: DC | PRN
Start: 2024-06-18 — End: 2024-06-18

## 2024-06-18 MED ORDER — SODIUM CHLORIDE 0.9 % (FLUSH) INJECTION SYRINGE
3.0000 mL | INJECTION | INTRAMUSCULAR | Status: DC | PRN
Start: 2024-06-18 — End: 2024-06-18

## 2024-06-18 MED ORDER — DIPHTH,PERTUSSIS(ACEL),TETANUS 2.5 LF UNIT-8 MCG-5 LF/0.5ML IM SYRINGE
0.5000 mL | INJECTION | INTRAMUSCULAR | Status: DC
Start: 2024-06-18 — End: 2024-06-18
  Administered 2024-06-18: 0 mL via INTRAMUSCULAR

## 2024-06-18 MED ORDER — DEXTROSE 5% IN WATER (D5W) FLUSH BAG - 250 ML
INTRAVENOUS | Status: DC | PRN
Start: 2024-06-18 — End: 2024-06-18

## 2024-06-18 MED ORDER — DIPHTH,PERTUSSIS(ACEL),TETANUS 2.5 LF UNIT-8 MCG-5 LF/0.5ML IM SYRINGE
INJECTION | INTRAMUSCULAR | Status: AC
Start: 2024-06-18 — End: 2024-06-18
  Filled 2024-06-18: qty 0.5

## 2024-06-18 MED ORDER — ONDANSETRON HCL (PF) 4 MG/2 ML INJECTION SOLUTION
INTRAMUSCULAR | Status: AC
Start: 2024-06-18 — End: 2024-06-18
  Filled 2024-06-18: qty 2

## 2024-06-18 MED ORDER — ONDANSETRON HCL (PF) 4 MG/2 ML INJECTION SOLUTION
4.0000 mg | INTRAMUSCULAR | Status: AC
Start: 2024-06-18 — End: 2024-06-18
  Administered 2024-06-18: 4 mg via INTRAVENOUS

## 2024-06-18 MED ORDER — CEPHALEXIN 500 MG CAPSULE: 500.0000 mg | ORAL_CAPSULE | Freq: Three times a day (TID) | ORAL | 0 refills | Status: DC

## 2024-06-23 ENCOUNTER — Ambulatory Visit (INDEPENDENT_AMBULATORY_CARE_PROVIDER_SITE_OTHER): Payer: Self-pay | Admitting: Nephrology

## 2024-06-23 ENCOUNTER — Encounter (INDEPENDENT_AMBULATORY_CARE_PROVIDER_SITE_OTHER): Payer: Self-pay | Admitting: Nephrology

## 2024-06-23 ENCOUNTER — Other Ambulatory Visit: Payer: Self-pay

## 2024-06-23 VITALS — BP 128/65 | HR 81 | Ht 62.0 in | Wt 136.0 lb

## 2024-06-23 DIAGNOSIS — C159 Malignant neoplasm of esophagus, unspecified: Secondary | ICD-10-CM

## 2024-06-23 DIAGNOSIS — I129 Hypertensive chronic kidney disease with stage 1 through stage 4 chronic kidney disease, or unspecified chronic kidney disease: Secondary | ICD-10-CM

## 2024-06-23 DIAGNOSIS — N2581 Secondary hyperparathyroidism of renal origin: Secondary | ICD-10-CM

## 2024-06-23 DIAGNOSIS — N184 Chronic kidney disease, stage 4 (severe): Secondary | ICD-10-CM

## 2024-06-23 DIAGNOSIS — T56891D Toxic effect of other metals, accidental (unintentional), subsequent encounter: Secondary | ICD-10-CM

## 2024-06-23 DIAGNOSIS — E875 Hyperkalemia: Secondary | ICD-10-CM

## 2024-06-23 DIAGNOSIS — R600 Localized edema: Secondary | ICD-10-CM

## 2024-06-23 DIAGNOSIS — E559 Vitamin D deficiency, unspecified: Secondary | ICD-10-CM

## 2024-06-23 MED ORDER — CALCITRIOL 0.25 MCG CAPSULE
0.2500 ug | ORAL_CAPSULE | Freq: Every day | ORAL | 0 refills | Status: AC
Start: 2024-06-23 — End: 2024-09-21

## 2024-06-23 NOTE — Progress Notes (Signed)
 NEPHROLOGY, [facility]  [address]  [place] [address]       Name: Paula Cochran MRN:  [id number]   Date of Birth: [birth year] Age: 78 y.o.   Date: 06/23/2024  Time: 11:32       Nephrology Office Note    Reason for visit: Follow Up (Follow up labs)      History of Present Illness:  Paula Cochran is a 78 y.o. female with past medical history of bipolar disorder was on lithium  for many many years she has underlying chronic kidney disease stage 4.    Patient is here for follow-up.  Patient denies chest pain.  No shortness of breath.  Patient has worsening edema.  Discussed with the taking Lasix  p.r.n.   Patient remains on Jardiance .    No dysuria.    Patient was diagnosed recently with esophagus cancer, she going to [facility] for further evaluation interested in treatment.  Patient has had recently a fall.  Possible fracture of the right hand.  The fall was mechanical.      Serum creatinine 2.34 GFR 21    LAB WORK HEMOGLOBIN 14.2 GLUCOSE 75 CREATININE 2.31 GFR 21 CHLORIDE 109 SODIUM 144 POTASSIUM 4.3 CALCIUM 9.6 TOTAL PROTEIN CREATININE RATIO 0.3 9, ALBUMIN TO CREATININE RATIO IS 39 VITAMIN-D 56 URIC ACID 6.9 MAGNESIUM  2.2 PTH 80    Past Medical History:  Past Medical History:   Diagnosis Date    Arthropathy     Asthma     Bipolar disorder, unspecified     Cancer (CMS HCC)     squamous cell    CKD (chronic kidney disease)     Wears glasses          Past Surgical History:  Past Surgical History:   Procedure Laterality Date    HX APPENDECTOMY      HX DILATION AND CURETTAGE      HX HYSTERECTOMY           Allergies:  Allergies   Allergen Reactions    Amoxicillin Anaphylaxis, Hives/ Urticaria and Shortness of Breath     Has patient had a PCN reaction causing immediate rash, facial/tongue/throat swelling, SOB or lightheadedness with hypotension: No   Has patient had a PCN reaction causing severe rash involving mucus membranes or skin necrosis: No   Has patient had a PCN reaction that  required hospitalization No   Has patient had a PCN reaction occurring within the last 10 years: Unknown   If all of the above answers are NO, then may proceed with Cephalosporin use.    Adhesive Rash, Itching and Hives/ Urticaria    Nickel Rash and Itching     Medications:  Current Outpatient Medications   Medication Sig    acetaminophen  (TYLENOL ) 500 mg Oral Tablet Take 1 Tablet (500 mg total) by mouth Every 6 hours as needed for Pain    albuterol  sulfate (PROVENTIL  OR VENTOLIN  OR PROAIR ) 90 mcg/actuation Inhalation oral inhaler Take 1-2 Puffs by inhalation Every 6 hours as needed    calcitrioL  (ROCALTROL ) 0.25 mcg Oral Capsule Take 1 Capsule (0.25 mcg total) by mouth Daily for 90 days    cephalexin (KEFLEX) 500 mg Oral Capsule Take 1 Capsule (500 mg total) by mouth Three times a day for 10 days    empagliflozin  (JARDIANCE ) 10 mg Oral Tablet Take 1 Tablet (10 mg total) by mouth Daily    furosemide  (LASIX ) 40 mg Oral Tablet Take 1 Tablet (40 mg total)  by mouth Once per day as needed (edema)    lamoTRIgine  (LAMICTAL ) 200 mg Oral Tablet Take 1 Tablet (200 mg total) by mouth Once a day    LORazepam  (ATIVAN ) 1 mg Oral Tablet Take 1 Tablet (1 mg total) by mouth Every night     Family History:  Family Medical History:    None         Social History:  Social History     Socioeconomic History    Marital status: Married   Tobacco Use    Smoking status: Every Day     Current packs/day: 0.25     Average packs/day: 0.3 packs/day for 20.0 years (5.0 ttl pk-yrs)     Types: Cigarettes    Smokeless tobacco: Never   Vaping Use    Vaping status: Never Used   Substance and Sexual Activity    Alcohol use: Never    Drug use: Never    Sexual activity: Yes     Social Determinants of Health     Social Connections: Low Risk (04/24/2023)    Social Connections     SDOH Social Isolation: 5 or more times a week       Review of Systems:        Systematic review of 12 organ systems was negative except what mentioned in in the HPI.      Physical  Exam:  Vitals:    06/23/24 1055   BP: 128/65   Pulse: 81   Weight: 61.7 kg (136 lb)   Height: 1.575 m (5' 2)   BMI: 24.87        Patient is alert awake and oriented not in acute distress.  Normal mood and affect.  Neck exam no JVD normal inspection.  Cardiovascular system: Regular rate and rhythm no murmurs rubs or gallops. No chest wall tenderness  Lungs: Clear to auscultation bilaterally no wheezing no rhonchi.  Abdomen soft nontender nondistended.  Extremities +2  edema  Neuro exam: EOMI, normal speech      Lab:  Lab Results   Component Value Date    BUN 31 (H) 06/16/2024    BUN 31 (H) 03/08/2024    BUN 19 10/14/2023    CREATININE 2.34 (H) 06/16/2024    CREATININE 2.01 (H) 03/08/2024    CREATININE 1.88 (H) 10/14/2023    BUNCRRATIO 13 06/16/2024    BUNCRRATIO 15 03/08/2024    BUNCRRATIO 10 10/14/2023    GFR 21 (L) 06/16/2024    GFR 25 (L) 03/08/2024    GFR 27 (L) 10/14/2023    SODIUM 141 06/16/2024    SODIUM 142 03/08/2024    SODIUM 141 10/14/2023    POTASSIUM 4.0 06/16/2024    POTASSIUM 5.3 (H) 03/08/2024    POTASSIUM 4.0 10/14/2023    CHLORIDE 104 06/16/2024    CHLORIDE 111 (H) 03/08/2024    CHLORIDE 108 (H) 10/14/2023    CO2 26 06/16/2024    CO2 25 03/08/2024    CO2 22 10/14/2023    ANIONGAP 11 06/16/2024    ANIONGAP 6 03/08/2024    ANIONGAP 11 10/14/2023    CALCIUM 9.4 06/16/2024    CALCIUM 9.7 03/08/2024    CALCIUM 10.3 10/14/2023    PHOSPHORUS 3.6 (L) 10/03/2022    ALBUMIN 4.4 10/09/2023    ALBUMIN 3.0 (L) 04/30/2023    ALBUMIN 2.6 (L) 04/29/2023    HGB 15.1 (H) 06/16/2024    HGB 14.4 (H) 03/08/2024    HGB 16.3 (H) 10/14/2023    HCT  44.4 (H) 06/16/2024    HCT 43.0 (H) 03/08/2024    HCT 48.9 (H) 10/14/2023    INTACTPTH 248.0 (H) 06/16/2024    INTACTPTH 139.6 (H) 03/08/2024    INTACTPTH 131.6 (H) 10/14/2023    IRON 169 10/03/2022    IRONBINDCAP 270 10/03/2022    IRONSAT 63 (H) 10/03/2022    FERRITIN 68 10/03/2022       Lab Results   Component Value Date    URICACID 6.5 06/16/2024    URICACID 5.1 03/08/2024     URICACID 5.2 10/14/2023        Assessment and Plan:  ENCOUNTER DIAGNOSES     ICD-10-CM   1. CKD (chronic kidney disease) stage 4, GFR 15-29 ml/min (CMS HCC)  N18.4   2. 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  Patient has stopped her lithium  completely she is on Abilify  and Lamictal .  -Creatinine is worse 2.3/GFR 21, creatinine 1.88 GFR 27 , creatinine 2.0 GFR 25, 2.3 GFR 21 stable  -Baseline creatinine 2.0  -Total protein to creatinine ratio, 0.3, 0.6, 0.6, 0.6  -UACR 39, 80, 36, 38  -CBC and a basic metabolic panel  -Return to clinic in 4 months  -Continue low-sodium diet  -balanced Fluid intake 40-50 oz a day.    -Avoid NSAIDs.  Tylenol  only for pain  -Renal imaging MRI indicating no masses, follow up with Urology outpatient.  Renal ultrasound indicating medical renal disease.  -ACEI or ARBS:  no  -Sodium Glucose Cotransporter-2 (SGLT2) Inhibitors:  Continue Jardiance   -Lasix  p.r.n..       Esophageal cancer  -follow up with [facility].  From Nephrology standpoint patient is considered moderate risk, discussed with her in length she will be interested in pursuing further hemo if needed    CKD bone mineral disease/secondary hyperparathyroidism   -PTH elevated .  Start calcitriol   PTH   Date Value Ref Range Status   06/16/2024 248.0 (H) 12.0 - 88.0 pg/mL Final   -Vitamin D  level at goal    Hypertension   -blood pressure is at goal  -continue to monitor    Edema  -fluid restriction less than 45 oz of water  a day and Lasix  40 mg for 5 days.    Hyperkalemia mild  -Lasix  .  Condition is better    Orders Placed This Encounter    BASIC METABOLIC PANEL    CBC/DIFF    MAGNESIUM     MICROALBUMIN/CREATININE RATIO, URINE, RANDOM    PARATHYROID  HORMONE (PTH)    PROTEIN/CREATININE RATIO, URINE, RANDOM    URIC ACID    VITAMIN D  25 TOTAL    calcitrioL  (ROCALTROL ) 0.25 mcg Oral Capsule

## 2024-06-30 ENCOUNTER — Other Ambulatory Visit (HOSPITAL_COMMUNITY): Payer: Self-pay

## 2024-06-30 ENCOUNTER — Encounter (HOSPITAL_COMMUNITY): Payer: Self-pay

## 2024-06-30 DIAGNOSIS — C160 Malignant neoplasm of cardia: Secondary | ICD-10-CM

## 2024-07-07 ENCOUNTER — Ambulatory Visit (HOSPITAL_BASED_OUTPATIENT_CLINIC_OR_DEPARTMENT_OTHER): Admission: RE | Admit: 2024-07-07 | Source: Ambulatory Visit

## 2024-07-07 ENCOUNTER — Other Ambulatory Visit (HOSPITAL_COMMUNITY): Payer: Self-pay | Admitting: Family

## 2024-07-07 ENCOUNTER — Ambulatory Visit: Admission: RE | Admit: 2024-07-07 | Source: Ambulatory Visit

## 2024-07-07 ENCOUNTER — Other Ambulatory Visit: Payer: Self-pay

## 2024-07-07 ENCOUNTER — Ambulatory Visit (HOSPITAL_COMMUNITY)

## 2024-07-07 DIAGNOSIS — K2282 Esophagogastric junction polyp: Secondary | ICD-10-CM

## 2024-07-07 DIAGNOSIS — R933 Abnormal findings on diagnostic imaging of other parts of digestive tract: Secondary | ICD-10-CM

## 2024-07-07 DIAGNOSIS — I2510 Atherosclerotic heart disease of native coronary artery without angina pectoris: Secondary | ICD-10-CM

## 2024-07-07 DIAGNOSIS — K449 Diaphragmatic hernia without obstruction or gangrene: Secondary | ICD-10-CM

## 2024-07-07 DIAGNOSIS — K7460 Unspecified cirrhosis of liver: Secondary | ICD-10-CM

## 2024-07-07 LAB — CREATININE WITH EGFR
CREATININE: 2.15 mg/dL — ABNORMAL HIGH (ref 0.60–1.30)
ESTIMATED GFR: 23 mL/min/1.73mˆ2 — ABNORMAL LOW (ref >59)

## 2024-07-07 MED ORDER — BARIUM SULFATE 2 % (W/V) ORAL SUSPENSION
450.0000 mL | ORAL | Status: AC
  Administered 2024-07-07: 450 mL via ORAL

## 2024-07-09 ENCOUNTER — Encounter (INDEPENDENT_AMBULATORY_CARE_PROVIDER_SITE_OTHER): Payer: Self-pay | Admitting: Surgery

## 2024-07-13 ENCOUNTER — Encounter (INDEPENDENT_AMBULATORY_CARE_PROVIDER_SITE_OTHER): Payer: Self-pay | Admitting: Surgery

## 2024-07-13 ENCOUNTER — Other Ambulatory Visit: Payer: Self-pay

## 2024-07-13 ENCOUNTER — Ambulatory Visit (INDEPENDENT_AMBULATORY_CARE_PROVIDER_SITE_OTHER): Payer: Self-pay | Admitting: Surgery

## 2024-07-13 VITALS — BP 143/76 | HR 76 | Wt 136.0 lb

## 2024-07-13 DIAGNOSIS — I878 Other specified disorders of veins: Secondary | ICD-10-CM

## 2024-07-13 DIAGNOSIS — C155 Malignant neoplasm of lower third of esophagus: Secondary | ICD-10-CM

## 2024-07-13 NOTE — Progress Notes (Signed)
 GENERAL SURGERY, [facility]  [address]  [place] [address]    History and Physical    Name: Paula Cochran MRN:  [id number]   Date: 07/13/2024 DOB:  1946/04/05 (78 y.o.)              Reason for Visit: General (Port placement )    History of Present Illness  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    Positive diabetes:  Jardiance   Negative blood thinners      Review of the result(s) of each unique test:  Patient underwent diagnostic testing ( none ) prior to this dates visit.  I have personally reviewed the results and that serves as a component of the medical decision making for this encounter       Review of prior external note(s) from each unique source:  Patients referral to this office including a recent assessment by the referring provider.  This was reviewed by me for this unique office visit for the indication and intent of the referral as well as any pertinent medical or surgical history relevant to the patients independent evaluation by me today.      Patient History  Past Medical History:   Diagnosis Date    Arthropathy     Asthma     Bipolar disorder, unspecified     Cancer (CMS HCC)     squamous cell    CKD (chronic kidney disease)     Wears glasses          Past Surgical History:   Procedure Laterality Date    HX APPENDECTOMY      HX DILATION AND CURETTAGE      HX HYSTERECTOMY           Current Outpatient Medications   Medication Sig    acetaminophen  (TYLENOL ) 500 mg Oral Tablet Take 1 Tablet (500 mg total) by mouth Every 6 hours as needed for Pain    albuterol  sulfate (PROVENTIL  OR VENTOLIN  OR PROAIR ) 90 mcg/actuation Inhalation oral inhaler Take 1-2 Puffs by inhalation Every 6 hours as needed    calcitrioL  (ROCALTROL ) 0.25 mcg Oral Capsule Take 1 Capsule (0.25 mcg total) by mouth Daily for 90 days    empagliflozin  (JARDIANCE ) 10 mg Oral Tablet  Take 1 Tablet (10 mg total) by mouth Daily    furosemide  (LASIX ) 40 mg Oral Tablet Take 1 Tablet (40 mg total) by mouth Once per day as needed (edema)    lamoTRIgine  (LAMICTAL ) 200 mg Oral Tablet Take 1 Tablet (200 mg total) by mouth Once a day    LORazepam  (ATIVAN ) 1 mg Oral Tablet Take 1 Tablet (1 mg total) by mouth Every night     Allergies[1]  Family Medical History:    None         Social History[2]         Physical Examination:  Vitals:    07/13/24 1430   BP: (!) 143/76   Pulse: 76   SpO2: 99%   Weight: 61.7 kg (136 lb)        General: appropriate for age. in no acute distress.    Vital signs are present above and have been reviewed by me     HEENT: Atraumatic, Normocephalic. PERRLA. EOMI. Nose clear. Throat clear    Lungs: Nonlabored breathing with symmetric expansion. Clear to auscultation bilaterally    Heart:Regular wth respect to  rate and rythmn.    Abdomen:Soft. Nontender. Nondistended and benign    Extremities: Grossly normal. No major deformities     Neuro:  Grossly normal motor and sensory function    Psychiatric: Alert and oriented to person, place, and time. affect appropriate      Assessment and Plan  Port-A-Cath insertion, left side scheduled for 07/19/2024     Risks, benefits, indications, complication of portacath/mediport insertion were discussed including but not limited to bleeding, infection, pneumothorax, arterial injury, cardiac arrhythmia, catheter related infectious process, procedural discomfort, air embolus, and even procedural related death.  All questions answered with informed signed consent obtained.      Follow Up:  No follow-ups on file.      ICD-10-CM    1. Poor venous access  I87.8       2. Malignant neoplasm of lower third of esophagus (CMS HCC)  C15.5           Paula Cochran B Paula Neth, MD ,MBA,FACS    I appreciate the opportunity to be involved in the care of your patients.  If you have any questions or concerns regarding this encounter, please do not hesitate to contact me at your  convenience.      This note may have been partially generated using MModal Fluency Direct system, and there may be some incorrect words, spellings, and punctuation that were not noted in checking the note before saving, though effort was made to avoid such errors.               [1]   Allergies  Allergen Reactions    Amoxicillin Anaphylaxis, Hives/ Urticaria and Shortness of Breath     Has patient had a PCN reaction causing immediate rash, facial/tongue/throat swelling, SOB or lightheadedness with hypotension: No   Has patient had a PCN reaction causing severe rash involving mucus membranes or skin necrosis: No   Has patient had a PCN reaction that required hospitalization No   Has patient had a PCN reaction occurring within the last 10 years: Unknown   If all of the above answers are NO, then may proceed with Cephalosporin use.    Adhesive Rash, Itching and Hives/ Urticaria    Nickel Rash and Itching   [2]   Social History  Tobacco Use    Smoking status: Former     Current packs/day: 0.25     Average packs/day: 0.3 packs/day for 20.0 years (5.0 ttl pk-yrs)     Types: Cigarettes    Smokeless tobacco: Never   Vaping Use    Vaping status: Never Used   Substance Use Topics    Alcohol use: Never    Drug use: Never

## 2024-07-15 ENCOUNTER — Encounter (HOSPITAL_COMMUNITY): Payer: Self-pay

## 2024-07-15 ENCOUNTER — Other Ambulatory Visit (HOSPITAL_COMMUNITY): Payer: Self-pay

## 2024-07-15 DIAGNOSIS — C160 Malignant neoplasm of cardia: Secondary | ICD-10-CM

## 2024-07-15 HISTORY — DX: Malignant neoplasm of cardia: C16.0

## 2024-07-15 NOTE — Progress Notes (Signed)
 Brief [facility] Note    Paula Cochran is a 78 y.o. female with a history of GEJ adenocarcinoma.     Initially went to [facility] to discuss staging and possible surgery but she declined. She is amenable to proceeding with defintiive chemoRT. PET Scheduled for later in December.     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 She had a consult with Surg Onc at [facility] and declined surgery. No EUS performed. Will obtain PET to complete stating for nodal/distant disease. I am planning to omit EUS due to time/location constraints.   - Will proceed with chemoRT with weekly carbo/taxol.   - May need a PEG. Will discuss with Dr. Veria he sees her.   - Recommended using protein shakes to help maintain her weight throughout treatment.   - Will send a referral to Dr. Veria with radiation oncology.   - Discussed the details and potential side effects of chemoRT. Explained that some people get feeding tubes before starting radiation treatments due to difficulty swallowing during radiation.

## 2024-07-19 ENCOUNTER — Encounter (HOSPITAL_COMMUNITY): Admission: RE | Disposition: A | Payer: Self-pay | Attending: Surgery

## 2024-07-19 ENCOUNTER — Encounter (HOSPITAL_COMMUNITY): Payer: Self-pay | Admitting: Surgery

## 2024-07-19 ENCOUNTER — Other Ambulatory Visit: Payer: Self-pay

## 2024-07-19 ENCOUNTER — Ambulatory Visit (HOSPITAL_COMMUNITY)

## 2024-07-19 ENCOUNTER — Ambulatory Visit (HOSPITAL_COMMUNITY): Admitting: Surgery

## 2024-07-19 ENCOUNTER — Ambulatory Visit: Admission: RE | Admit: 2024-07-19 | Discharge: 2024-07-19 | Disposition: A | Attending: Surgery | Admitting: Surgery

## 2024-07-19 DIAGNOSIS — C160 Malignant neoplasm of cardia: Secondary | ICD-10-CM

## 2024-07-19 DIAGNOSIS — Z452 Encounter for adjustment and management of vascular access device: Secondary | ICD-10-CM

## 2024-07-19 DIAGNOSIS — Z7984 Long term (current) use of oral hypoglycemic drugs: Secondary | ICD-10-CM | POA: Insufficient documentation

## 2024-07-19 DIAGNOSIS — E1122 Type 2 diabetes mellitus with diabetic chronic kidney disease: Secondary | ICD-10-CM | POA: Insufficient documentation

## 2024-07-19 DIAGNOSIS — I878 Other specified disorders of veins: Secondary | ICD-10-CM

## 2024-07-19 DIAGNOSIS — N189 Chronic kidney disease, unspecified: Secondary | ICD-10-CM | POA: Insufficient documentation

## 2024-07-19 MED ORDER — ONDANSETRON HCL (PF) 4 MG/2 ML INJECTION SOLUTION: 4.0000 mg | Freq: Four times a day (QID) | INTRAMUSCULAR | Status: DC | PRN

## 2024-07-19 MED ORDER — ACETAMINOPHEN 325 MG TABLET: 650.0000 mg | ORAL_TABLET | ORAL | Status: DC | PRN

## 2024-07-19 MED ORDER — SODIUM CHLORIDE 0.9 % (FLUSH) INJECTION SYRINGE: 3.0000 mL | INJECTION | Freq: Three times a day (TID) | INTRAMUSCULAR | Status: DC

## 2024-07-19 MED ORDER — METOCLOPRAMIDE 5 MG/ML INJECTION SOLUTION: 10.0000 mg | Freq: Four times a day (QID) | INTRAMUSCULAR | Status: DC | PRN

## 2024-07-19 MED ORDER — HYDROCODONE 5 MG-ACETAMINOPHEN 325 MG TABLET: 1.0000 | ORAL_TABLET | ORAL | Status: DC | PRN

## 2024-07-19 MED ORDER — DEXAMETHASONE SODIUM PHOSPHATE 4 MG/ML INJECTION SOLUTION
Freq: Once | INTRAMUSCULAR | Status: DC | PRN
  Administered 2024-07-19: 4 mg via INTRAVENOUS

## 2024-07-19 MED ORDER — IPRATROPIUM 0.5 MG-ALBUTEROL 3 MG (2.5 MG BASE)/3 ML NEBULIZATION SOLN: 3.0000 mL | INHALATION_SOLUTION | Freq: Once | RESPIRATORY_TRACT | Status: DC | PRN

## 2024-07-19 MED ORDER — SODIUM CHLORIDE 0.9 % (FLUSH) INJECTION SYRINGE: 3.0000 mL | INJECTION | INTRAMUSCULAR | Status: DC | PRN

## 2024-07-19 MED ORDER — FENTANYL (PF) 50 MCG/ML INJECTION WRAPPER: 25.0000 ug | INJECTION | INTRAMUSCULAR | Status: DC | PRN

## 2024-07-19 MED ORDER — LEVOFLOXACIN 500 MG/100 ML IN 5 % DEXTROSE INTRAVENOUS PIGGYBACK
INJECTION | Freq: Once | INTRAVENOUS | Status: DC | PRN
  Administered 2024-07-19: 500 mg via INTRAVENOUS

## 2024-07-19 MED ORDER — ONDANSETRON HCL (PF) 4 MG/2 ML INJECTION SOLUTION: 4.0000 mg | Freq: Once | INTRAMUSCULAR | Status: DC | PRN

## 2024-07-19 MED ORDER — PROPOFOL 10 MG/ML IV BOLUS
INJECTION | Freq: Once | INTRAVENOUS | Status: DC | PRN
  Administered 2024-07-19: 80 mg via INTRAVENOUS

## 2024-07-19 MED ORDER — ONDANSETRON 4 MG DISINTEGRATING TABLET: 4.0000 mg | ORAL_TABLET | Freq: Three times a day (TID) | ORAL | 0 refills | Status: DC | PRN

## 2024-07-19 MED ORDER — FENTANYL (PF) 50 MCG/ML INJECTION WRAPPER: 50.0000 ug | INJECTION | INTRAMUSCULAR | Status: DC | PRN

## 2024-07-19 MED ORDER — TRAMADOL 50 MG TABLET: 1.0000 | ORAL_TABLET | Freq: Three times a day (TID) | ORAL | 0 refills | Status: DC | PRN

## 2024-07-19 MED ORDER — HEPARIN (PORCINE) 5,000 UNIT/ML INJECTION SOLUTION
INTRAMUSCULAR | Status: AC
  Filled 2024-07-19: qty 1

## 2024-07-19 MED ORDER — ONDANSETRON HCL (PF) 4 MG/2 ML INJECTION SOLUTION
Freq: Once | INTRAMUSCULAR | Status: DC | PRN
  Administered 2024-07-19: 4 mg via INTRAVENOUS

## 2024-07-19 MED ORDER — LACTATED RINGERS INTRAVENOUS SOLUTION: INTRAVENOUS | Status: DC

## 2024-07-19 MED ORDER — DEXTROSE 5% IN WATER (D5W) FLUSH BAG - 250 ML: INTRAVENOUS | Status: DC | PRN

## 2024-07-19 MED ORDER — SODIUM CHLORIDE 0.9% FLUSH BAG - 250 ML: INTRAVENOUS | Status: DC | PRN

## 2024-07-19 MED ORDER — KETOROLAC 30 MG/ML (1 ML) INJECTION SOLUTION: 15.0000 mg | Freq: Three times a day (TID) | INTRAMUSCULAR | Status: DC | PRN

## 2024-07-19 MED ORDER — ROPIVACAINE (PF) 2 MG/ML (0.2 %) INJECTION SOLUTION
INTRAMUSCULAR | Status: AC
  Filled 2024-07-19: qty 20

## 2024-07-19 MED ORDER — ONDANSETRON 4 MG DISINTEGRATING TABLET: 4.0000 mg | ORAL_TABLET | Freq: Four times a day (QID) | ORAL | Status: DC | PRN

## 2024-07-19 MED ORDER — ETHYL ALCOHOL 62 % TOPICAL SWAB
1.0000 | Freq: Once | CUTANEOUS | Status: AC
  Administered 2024-07-19: 1 via NASAL

## 2024-07-19 MED ORDER — LIDOCAINE (PF) 100 MG/5 ML (2 %) INTRAVENOUS SYRINGE
INJECTION | Freq: Once | INTRAVENOUS | Status: DC | PRN
  Administered 2024-07-19: 60 mg via INTRAVENOUS

## 2024-07-19 MED ORDER — FENTANYL (PF) 50 MCG/ML INJECTION WRAPPER
INJECTION | Freq: Once | INTRAMUSCULAR | Status: DC | PRN
  Administered 2024-07-19 (×2): 25 ug via INTRAVENOUS

## 2024-07-19 MED ORDER — FENTANYL (PF) 50 MCG/ML INJECTION SOLUTION
INTRAMUSCULAR | Status: AC
  Filled 2024-07-19: qty 2

## 2024-07-19 MED ORDER — LEVOFLOXACIN 500 MG/100 ML IN 5 % DEXTROSE INTRAVENOUS PIGGYBACK
INJECTION | INTRAVENOUS | Status: AC
  Filled 2024-07-19: qty 100

## 2024-07-19 MED ORDER — HEPARIN (PORCINE) 5,000 UNIT/ML INJECTION SOLUTION
Freq: Once | INTRAMUSCULAR | Status: DC | PRN
  Administered 2024-07-19: 5000 [IU] via INTRAMUSCULAR

## 2024-07-19 MED ORDER — ALBUTEROL SULFATE 2.5 MG/3 ML (0.083 %) SOLUTION FOR NEBULIZATION: 2.5000 mg | INHALATION_SOLUTION | Freq: Once | RESPIRATORY_TRACT | Status: DC | PRN

## 2024-07-19 MED ORDER — EPHEDRINE SULFATE 50 MG/ML INTRAVENOUS SOLUTION
Freq: Once | INTRAVENOUS | Status: DC | PRN
  Administered 2024-07-19 (×2): 10 mg via INTRAVENOUS

## 2024-07-19 MED ORDER — PROCHLORPERAZINE EDISYLATE 10 MG/2 ML (5 MG/ML) INJECTION SOLUTION: 5.0000 mg | Freq: Once | INTRAMUSCULAR | Status: DC | PRN

## 2024-07-19 MED ORDER — ROPIVACAINE (PF) 2 MG/ML (0.2 %) INJECTION SOLUTION
Freq: Once | INTRAMUSCULAR | Status: DC | PRN
  Administered 2024-07-19: 20 mL via INTRAMUSCULAR

## 2024-07-19 NOTE — H&P (Signed)
 Paula Cochran  General Surgery  History and Physical    Date of Service:  07/19/2024  Paula Cochran, Paula Cochran, 78 y.o. female  Date of Admission:  07/19/2024  Date of Birth:  24-Aug-1945  PCP: Paula LELON Pizza, MD    Reason for admission:  Port-A-Cath insertion    HPI:  Paula Cochran is a 78 y.o. 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     Positive diabetes:  Jardiance   Negative blood thinners        Review of the result(s) of each unique test:  Patient underwent diagnostic testing ( none ) prior to this dates visit.  I have personally reviewed the results and that serves as a component of the medical decision making for this encounter        Review of prior external note(s) from each unique source:  Patients referral to this office including a recent assessment by the referring provider.  This was reviewed by me for this unique office visit for the indication and intent of the referral as well as any pertinent medical or surgical history relevant to the patients independent evaluation by me today.    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    Wears glasses       Past Surgical History:   Procedure Laterality Date    HX APPENDECTOMY      HX DILATION AND CURETTAGE      HX HYSTERECTOMY        Social History[1]    Family Medical History:    None        Medications Prior to Admission       Prescriptions    acetaminophen  (TYLENOL ) 500 mg Oral Tablet    Take 1 Tablet (500 mg total) by mouth Every 6 hours as needed for Pain    ADVAIR HFA 115-21 mcg/actuation Inhalation oral inhaler    Take 2 Puffs by inhalation Every 12 hours    albuterol  sulfate (PROVENTIL  OR  VENTOLIN  OR PROAIR ) 90 mcg/actuation Inhalation oral inhaler    Take 1-2 Puffs by inhalation Every 6 hours as needed    busPIRone (BUSPAR) 5 mg Oral Tablet    Take 1 Tablet (5 mg total) by mouth Twice daily    Patient not taking:  Reported on 07/19/2024    calcitrioL  (ROCALTROL ) 0.25 mcg Oral Capsule    Take 1 Capsule (0.25 mcg total) by mouth Daily for 90 days    COMPAZINE  10 mg Oral Tablet    Take 1 Tablet (10 mg total) by mouth Every 6 hours as needed    empagliflozin  (JARDIANCE ) 10 mg Oral Tablet    Take 1 Tablet (10 mg total) by mouth Daily    famotidine  (PEPCID ) 40 mg Oral Tablet    Take 1 Tablet (40 mg total) by mouth Every night    furosemide  (LASIX ) 40 mg Oral Tablet    Take 1 Tablet (40 mg total) by mouth Once per day as needed (edema)    INGREZZA 40 mg Oral Capsule    Take 1 Capsule (  40 mg total) by mouth Daily    Patient not taking:  Reported on 07/19/2024    lamoTRIgine  (LAMICTAL ) 200 mg Oral Tablet    Take 1 Tablet (200 mg total) by mouth Daily    LORazepam  (ATIVAN ) 1 mg Oral Tablet    Take 1 Tablet (1 mg total) by mouth Every night    Mirtazapine (REMERON) 7.5 mg Oral Tablet    Take 1 Tablet (7.5 mg total) by mouth Every night    ondansetron  (ZOFRAN ) 8 mg Oral Tablet    Take 1 Tablet (8 mg total) by mouth Every 8 hours as needed    pantoprazole  (PROTONIX ) 40 mg Oral Tablet, Delayed Release (E.C.)    Take 1 Tablet (40 mg total) by mouth Twice daily    potassium chloride (K-DUR) 20 mEq Oral Tab Sust.Rel. Particle/Crystal    Take 1 Tablet (20 mEq total) by mouth Every other day    Patient not taking:  Reported on 07/19/2024           Allergies[2]       Patient Vitals for the past 24 hrs:   BP Temp Pulse Resp SpO2 Height Weight   07/19/24 0710 119/66 36.6 C (97.8 F) 79 20 96 % 1.575 m (5' 2) 60.8 kg (134 lb)          General: appropriate for age. in no acute distress.    Vital signs are present above and have been reviewed by me     HEENT: Atraumatic, Normocephalic. PERRLA, EOMI. Nose clear. Throat  clear.    Lungs: Nonlabored breathing with symmetric expansion.  Clear to auscultation bilaterally    Heart:Regular wth respect to rate and rythmn.    Abdomen:Soft. Nontender. Nondistended and benign    Extremities:  Grossly normal with good range of motion and no major deformities.    Neuro:  Grossly normal motor and sensory function. CN's II through XII intact.    Psychiatric: Alert and oriented to person, place, and time. affect appropriate    Laboratory Data:     No results found for any visits on 07/19/24 (from the past 24 hours).    Imaging Studies:    FLUORO C ARM IN OR < 60 MINS    (Results Pending)        Assessment/Plan:  Poor venous access [I87.8]    Port-A-Cath insertion, left side scheduled for 07/19/2024      Risks, benefits, indications, complication of portacath/mediport insertion were discussed including but not limited to bleeding, infection, pneumothorax, arterial injury, cardiac arrhythmia, catheter related infectious process, procedural discomfort, air embolus, and even procedural related death.  All questions answered with informed signed consent obtained.    This note was partially created using voice recognition software and is inherently subject to errors including those of syntax and sound alike  substitutions which may escape proof reading. In such instances, original meaning may be extrapolated by contextual derivation.    Paula Cuff B Kamani Magnussen, MD, MBA, FACS         [1]   Social History  Tobacco Use    Smoking status: Former     Current packs/day: 0.25     Average packs/day: 0.3 packs/day for 20.0 years (5.0 ttl pk-yrs)     Types: Cigarettes    Smokeless tobacco: Never   Vaping Use    Vaping status: Never Used   Substance Use Topics    Alcohol  use: Never    Drug use: Never   [2]   Allergies  Allergen Reactions    Amoxicillin Anaphylaxis, Hives/ Urticaria and Shortness of Breath     Has patient had a PCN reaction causing immediate rash, facial/tongue/throat swelling, SOB or lightheadedness  with hypotension: No   Has patient had a PCN reaction causing severe rash involving mucus membranes or skin necrosis: No   Has patient had a PCN reaction that required hospitalization No   Has patient had a PCN reaction occurring within the last 10 years: Unknown   If all of the above answers are NO, then may proceed with Cephalosporin use.    Adhesive Rash, Itching and Hives/ Urticaria    Latex Rash    Nickel Rash and Itching

## 2024-07-19 NOTE — Anesthesia Transfer of Care (Signed)
 ANESTHESIA TRANSFER OF CARE   Paula Cochran is a 78 y.o. 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    Wears glasses       Allergy History as of 07/19/24       AMOXICILLIN         Noted Status Severity Type Reaction    07/10/23 9077 Wash Norris, LPN 88/89/82 Active High Intolerance Anaphylaxis, Hives/ Urticaria, Shortness of Breath    Comments: Has patient had a PCN reaction causing immediate rash, facial/tongue/throat swelling, SOB or lightheadedness with hypotension: No   Has patient had a PCN reaction causing severe rash involving mucus membranes or skin necrosis: No   Has patient had a PCN reaction that required hospitalization No   Has patient had a PCN reaction occurring within the last 10 years: Unknown   If all of the above answers are NO, then may proceed with Cephalosporin use.     11/08/21 0038 Jerome Stanford, RN 11/08/21 Active High Intolerance Anaphylaxis              NICKEL         Noted Status Severity Type Reaction    11/08/21 0038 Jerome Stanford, RN 11/08/21 Active Medium  Rash, Itching              ADHESIVE         Noted Status Severity Type Reaction    11/08/21 0038 Jerome Stanford, RN 11/08/21 Active Medium  Rash, Itching, Hives/ Urticaria              LATEX         Noted Status Severity Type Reaction    07/19/24 9287 Derinda Riggs, RN 07/19/24 Active Medium  Rash                  I completed my transfer of care / handoff to the receiving personnel during which we discussed:  Access, Airway, All key/critical aspects of case discussed, Analgesia, Antibiotics, Expectation of post procedure, Fluids/Product, Gave opportunity for questions and  acknowledgement of understanding, Labs and PMHx  Report given to: Claudene Race, RN    Post Location: PACU                                                           Last OR Temp: Temperature: 36.2 C (97.1 F)      Airway:* No LDAs found *  Blood pressure 119/66, pulse 91, temperature 36.2 C (97.1 F), resp. rate 13, height 1.575 m (5' 2), weight 60.8 kg (134 lb), SpO2 100%.

## 2024-07-19 NOTE — OR PostOp (Signed)
Patient's ordered portable chest x-ray has been taken.

## 2024-07-19 NOTE — OR Surgeon (Signed)
 [facility]      Patient Name: [place], Bejar Number: [id number]  Date of Service: 07/19/2024   Date of Birth: 08/30/1945      Pre-Operative Diagnosis: Poor venous access [I87.8]     Post-Operative Diagnosis: Poor venous access [I87.8]    Procedure(s)/Description:  PORTACATH INSERTION LEFT SUBCLAVIAN VEIN: 63438 (CPT)       Attending Surgeon: Amaryllis KATHEE Denise, MD     Anesthesia:  Anesthesiologist: Renella Shove, MD  CRNA: Nira Lenis, CRNA    Anesthesia Type: .Monitor Anesthesia Care     First Assist: Carlin Romano  FA Function: Application of dressing     Estimated Blood Loss:  Minimal    The patient was brought in to the operating room, placed on the table in the supine position. The LEFT chest was then prepped and draped in a sterile manner and the infraclavicular region was infiltrated with local anesthetic. Then after adequate anesthesia the subclavian vein was entered and via the modified Seldinger technique the guide wire was inserted and confirmed to be in good position under fluoroscopy. The chest was then infiltrated with local anesthetic and a subcutaneous pocket was made. A curved tunneling device was used to make a subcutaneous tunnel, connecting the subcutaneous pocket with the guide wire insertion site. The catheter was then passed through the subcutaneous tunnel and then the introducer and sheath were passed over the guide wire and then the guide wire was removed, as well as the introducer, leaving the sheath in place. The tip of the catheter was inserted into the sheath, into the superior vena cava and then under fluoroscopic confirmation good position of the tip of the catheter was identified. The catheter was then cut, such that its length would approximate the level of the subcutaneous pocket. It was then attached to the subcutaneous port and sutured to the underlying fascia with 2-0 Vicryl sutures. Hemostasis was well obtained. The catheter was aspirated with good  blood return and the catheter was flushed. The subcutaneous pocket was then closed with a running 5-0 Vicryl suture in a buried subcuticular fashion. The superior incision at the guide wire entry site was closed with a single interrupted 5-0 Vicryl suture in a buried subcuticular fashion. The area was clean and dry and sterile dressing was applied over the incision. Patient tolerated the procedure well. No complications encountered    Delylah Stanczyk B. Collier Monica, MD, MBA, FACS  [facility] -General Surgery

## 2024-07-19 NOTE — Anesthesia Preprocedure Evaluation (Signed)
 ANESTHESIA PRE-OP EVALUATION  Planned Procedure: PORTACATH INSERTION (Left: Chest)  Review of Systems     anesthesia history negative     patient summary reviewed          Pulmonary   asthma and past history of smoking ,   Cardiovascular  negative cardio ROS,   ECG reviewed ,No peripheral edema,        GI/Hepatic/Renal    liver disease        Endo/Other   neg endo/other ROS,       Neuro/Psych/MS    depression, bipolar disorder     Cancer  CA and esophageal cancer,                     Physical Assessment      Airway       Mallampati: II    TM distance: 3 FB    Neck ROM: full  Mouth Opening: good.            Dental           (+) lower dentures, upper dentures           Pulmonary    Breath sounds clear to auscultation  (-) no rhonchi, no decreased breath sounds, no wheezes, no rales and no stridor     Cardiovascular    Rhythm: regular  Rate: Normal  (-) no friction rub, carotid bruit is not present, no peripheral edema and no murmur     Other findings            Plan  ASA 3     Planned anesthesia type: general     general anesthesia with laryngeal mask airway      PONV Plan:  I plan to administer pharmcologic prophalaxis antiemetics                  Anesthesia issues/risks discussed are: PONV, Post-op Pain Management, Cardiac Events/MI, Stroke, Aspiration and Sore Throat.  Anesthetic plan and risks discussed with patient  signed consent obtained          Patient's NPO status is appropriate for Anesthesia.

## 2024-07-19 NOTE — Nurses Notes (Signed)
 Hand off report given to Annabella Harvey, RN. Dressing to patient's chest is clean, dry and intact. IV is clean, dry and intact and running LR. Vitals: temperature 98.0 skin, heart rate 80, respirations 16, blood pressure 104/60 and oxygen is 95 on room air.

## 2024-07-19 NOTE — Anesthesia Postprocedure Evaluation (Signed)
 Anesthesia Post Op Evaluation    Patient: Paula Cochran  Procedure(s):  PORTACATH INSERTION LEFT SUBCLAVIAN VEIN    Last Vitals:Temperature: 36.2 C (97.1 F) (07/19/24 0940)  Heart Rate: 86 (07/19/24 0942)  BP (Non-Invasive): 111/62 (07/19/24 0942)  Respiratory Rate: 18 (07/19/24 0942)  SpO2: 100 % (07/19/24 0942)    No notable events documented.    Patient is sufficiently recovered from the effects of anesthesia to participate in the evaluation and has returned to their pre-procedure level.  Patient location during evaluation: PACU       Patient participation: complete - patient participated  Level of consciousness: awake and alert and responsive to verbal stimuli    Pain management: adequate  Airway patency: patent    Anesthetic complications: no  Cardiovascular status: acceptable  Respiratory status: acceptable  Hydration status: acceptable  Patient post-procedure temperature: Pt Normothermic   PONV Status: Absent

## 2024-07-22 ENCOUNTER — Encounter (HOSPITAL_COMMUNITY): Payer: Self-pay

## 2024-07-22 ENCOUNTER — Other Ambulatory Visit (HOSPITAL_COMMUNITY): Payer: Self-pay

## 2024-07-22 DIAGNOSIS — C160 Malignant neoplasm of cardia: Secondary | ICD-10-CM

## 2024-07-22 NOTE — Nurses Notes (Signed)
 Post op follow up phone call attempted, no answer.

## 2024-07-24 ENCOUNTER — Encounter (HOSPITAL_COMMUNITY): Payer: Self-pay

## 2024-07-24 ENCOUNTER — Emergency Department
Admission: EM | Admit: 2024-07-24 | Discharge: 2024-07-24 | Disposition: A | Discharge status: Home / Self Care | Attending: PHYSICIAN ASSISTANT | Admitting: PHYSICIAN ASSISTANT

## 2024-07-24 ENCOUNTER — Other Ambulatory Visit: Payer: Self-pay

## 2024-07-24 DIAGNOSIS — W228XXA Striking against or struck by other objects, initial encounter: Secondary | ICD-10-CM | POA: Insufficient documentation

## 2024-07-24 DIAGNOSIS — W0110XA Fall on same level from slipping, tripping and stumbling with subsequent striking against unspecified object, initial encounter: Secondary | ICD-10-CM

## 2024-07-24 DIAGNOSIS — S61412A Laceration without foreign body of left hand, initial encounter: Secondary | ICD-10-CM

## 2024-07-24 MED ORDER — CEPHALEXIN 500 MG CAPSULE
500.0000 mg | ORAL_CAPSULE | ORAL | Status: AC
  Administered 2024-07-24: 500 mg via ORAL

## 2024-07-24 MED ORDER — BACITRACIN 500 UNIT/G OINTMENT TUBE: TOPICAL_OINTMENT | CUTANEOUS | Status: AC

## 2024-07-24 MED ORDER — CEPHALEXIN 500 MG CAPSULE: 500.0000 mg | ORAL_CAPSULE | Freq: Three times a day (TID) | ORAL | 0 refills | Status: DC

## 2024-07-24 MED ORDER — BACITRACIN 500 UNIT/G OINTMENT TUBE
TOPICAL_OINTMENT | CUTANEOUS | Status: AC
  Filled 2024-07-24: qty 28.4

## 2024-07-24 MED ORDER — CEPHALEXIN 500 MG CAPSULE
ORAL_CAPSULE | ORAL | Status: AC
  Filled 2024-07-24: qty 1

## 2024-07-24 NOTE — ED Triage Notes (Signed)
 Clemens couple days ago injury to left hand states hit on bed cut still bleeding . No blood thinners

## 2024-07-24 NOTE — ED Nurses Note (Signed)
 Dressing removed by provider and bacitracin  applied to site. Nonadherent dressing applied, wrapped wound with kling and education provided on wound care. Patient D/C home at this time. Instructions reviewed and understanding verbalized. Discharge paperwork provided. Patient left department via ambulation.

## 2024-07-24 NOTE — ED Nurses Note (Signed)
 Patient to ED room 31 via wheelchair. She says she fell a couple days ago going to the bathroom and hit her hand on the bed and cut her left hand. She says it has still been bleeding and that is why she came today. She says the hand is only painful when she touches it.

## 2024-07-24 NOTE — Discharge Instructions (Addendum)
 Use copious amount of ointment on the wound, then gauze.  Replace every 6-8 hours to wet the bandage before removing. Take entire course of antibiotic to keep from getting infected.     It is very important that you follow up with your doctor to discuss all of your results from today's visit.  Discuss any new medications prescribed to be sure these do not interact with any you may already be taking.  Do not drive or operate heavy machinery while taking any pain medication or muscle relaxers.      Please return immediately if your condition worsens, further concerns arise, or if you have trouble getting a followup appointment.

## 2024-07-24 NOTE — ED Provider Notes (Signed)
 [facility] - Emergency Department  ED Primary Note  History of Present Illness   Paula Cochran is a 79 y.o. female who had concerns including Hand Injury.  Patient fell 2 days ago and injured her left hand, striking it on furniture.  Denies hitting her head or losing consciousness.  Denies any other acute injuries.  Patient up-to-date on tetanus.  She received skin tear that has been bleeding since.  Denies fever or chills.    Physical Exam   ED Triage Vitals [07/24/24 0947]   BP (Non-Invasive) 137/66   Heart Rate 99   Respiratory Rate 20   Temperature 36.8 C (98.2 F)   SpO2 98 %   Weight 60.8 kg (134 lb)   Height 1.585 m (5' 2.4)     CONSTITUTIONAL: _Patient resting comfortably on the cot  SKIN: _ wound dressing was stuck to the wound, when it was taken off, it ripped and slow bleeding started.     Media Information      Document Information    Photographic Image: [email or web address]   Laceration left hand   07/24/2024 09:59   Attached To:   Hospital Encounter on 07/24/24   Source Information    Altha Lockwood, RN  Prn Ed   Document History      EYES: _pupils are equally round and reactive to light, extraocular movements intact, clear conjunctiva, non-icteric sclera  HENT: _normocephalic, atraumatic, moist mucus membranes  NECK: _Nontender and supple with no nuchal rigidity, no lymphadenopathy, full range of motion  PULMONARY: _clear to auscultation without wheezes, rhonchi, or rales, normal excursion, no accessory muscle use and no stridor  CARDIOVASCULAR: _regular rate, rhythm, normal S1 and S2. No appreciated murmurs. Strong distal pulses with intact distal perfusion  GASTROINTESTINAL: _soft, non-tender, non-distended, no palpable masses, no rebound or guarding  LYMPHATICS: _no edema in lower extremities, no lymphadenopathy  MUSCULOSKELETAL: _Extremities are nontender to palpation and have no gross deformity, no edema, redness, or swelling  NEUROLOGIC: _a/o x 3, GCS 15, normal mentation and  speech. Moves all extremities x 4 without motor or sensory deficit  PSYCHIATRIC: _normal mood and affect, thought process is clear and linear      Patient Data   Labs Ordered/Reviewed - No data to display  No orders to display     Medical Decision Making        Medical Decision Making  Risk  OTC drugs.  Prescription drug management.    78 year old female seen and examined in the emergency department with complaints of left hand skin tear.  Vital signs stable.  Physical exam reveals skin tear that had dressing dried to it.  When it was removed, the wound started bleeding.  They were given extensive wound care instructions including applying large amount of bacitracin /ointment to the wound prior to placing for before gauze and tape around the .  Gauze.  Removed with saline.  They were given wound care supplies.  Initiated on Keflex  to prevent infection.  She is up-to-date on tetanus.  Follow-up with PCP.  She is instructed to return to the emergency department if she experiences worrisome or worsening symptoms.  Patient educated as to what those symptoms might be.  Patient acknowledged and is agreeable to plan.  She will be discharged home in stable condition.             Medications Ordered/Administered in the ED   bacitracin  500 units/gram topical ointment tube (has no administration in time range)   cephalexin  (  KEFLEX ) capsule (has no administration in time range)     Clinical Impression   Skin tear of left hand without complication, initial encounter (Primary)       Disposition: Discharged

## 2024-07-26 ENCOUNTER — Ambulatory Visit
Admission: RE | Admit: 2024-07-26 | Discharge: 2024-07-26 | Disposition: A | Discharge status: Home / Self Care | Source: Ambulatory Visit

## 2024-07-26 ENCOUNTER — Other Ambulatory Visit: Payer: Self-pay

## 2024-07-26 DIAGNOSIS — C160 Malignant neoplasm of cardia: Secondary | ICD-10-CM | POA: Insufficient documentation

## 2024-07-28 ENCOUNTER — Ambulatory Visit
Admission: RE | Admit: 2024-07-28 | Discharge: 2024-07-28 | Disposition: A | Discharge status: Home / Self Care | Source: Ambulatory Visit

## 2024-07-28 ENCOUNTER — Other Ambulatory Visit: Payer: Self-pay

## 2024-07-28 ENCOUNTER — Encounter (HOSPITAL_COMMUNITY): Payer: Self-pay

## 2024-07-28 VITALS — BP 106/51 | HR 80 | Temp 97.5°F | Resp 18 | Ht 62.0 in | Wt 135.0 lb

## 2024-07-28 DIAGNOSIS — C160 Malignant neoplasm of cardia: Secondary | ICD-10-CM | POA: Insufficient documentation

## 2024-07-28 DIAGNOSIS — Z5111 Encounter for antineoplastic chemotherapy: Secondary | ICD-10-CM | POA: Insufficient documentation

## 2024-07-28 LAB — COMPREHENSIVE METABOLIC PANEL, NON-FASTING
ALBUMIN/GLOBULIN RATIO: 1.9 — ABNORMAL HIGH (ref 0.8–1.4)
ALBUMIN: 4 g/dL (ref 3.5–5.7)
ALKALINE PHOSPHATASE: 168 U/L — ABNORMAL HIGH (ref 34–104)
ALT (SGPT): 12 U/L (ref 7–52)
ANION GAP: 9 mmol/L (ref 4–13)
AST (SGOT): 18 U/L (ref 13–39)
BILIRUBIN TOTAL: 0.7 mg/dL (ref 0.3–1.0)
BUN/CREA RATIO: 9 (ref 6–22)
BUN: 19 mg/dL (ref 7–25)
CALCIUM, CORRECTED: 9.6 mg/dL (ref 8.9–10.8)
CALCIUM: 9.6 mg/dL (ref 8.6–10.3)
CHLORIDE: 109 mmol/L — ABNORMAL HIGH (ref 98–107)
CO2 TOTAL: 22 mmol/L (ref 21–31)
CREATININE: 2.23 mg/dL — ABNORMAL HIGH (ref 0.60–1.30)
ESTIMATED GFR: 22 mL/min/1.73mˆ2 — ABNORMAL LOW (ref >59)
GLOBULIN: 2.1 (ref 2.0–3.5)
GLUCOSE: 88 mg/dL (ref 74–109)
OSMOLALITY, CALCULATED: 281 mosm/kg (ref 270–290)
POTASSIUM: 4 mmol/L (ref 3.5–5.1)
PROTEIN TOTAL: 6.1 g/dL — ABNORMAL LOW (ref 6.4–8.9)
SODIUM: 140 mmol/L (ref 136–145)

## 2024-07-28 LAB — SCAN DIFFERENTIAL
PLATELET MORPHOLOGY COMMENT: NORMAL
RBC MORPHOLOGY COMMENT: NORMAL
SCHISTOCYTES: ABSENT

## 2024-07-28 LAB — CBC WITH DIFF
BASOPHIL #: 0 x10ˆ3/uL (ref 0.00–0.10)
BASOPHIL %: 0 % (ref 0–1)
EOSINOPHIL #: 0.2 x10ˆ3/uL (ref 0.00–0.50)
EOSINOPHIL %: 3 % (ref 1–7)
HCT: 43.8 % — ABNORMAL HIGH (ref 31.2–41.9)
HGB: 13.3 g/dL (ref 10.9–14.3)
LYMPHOCYTE #: 1.2 x10ˆ3/uL (ref 1.10–3.10)
LYMPHOCYTE %: 18 % (ref 16–46)
MCH: 29.4 pg (ref 24.7–32.8)
MCHC: 30.3 g/dL — ABNORMAL LOW (ref 32.3–35.6)
MCV: 97.1 fL — ABNORMAL HIGH (ref 75.5–95.3)
MONOCYTE #: 0.4 x10ˆ3/uL (ref 0.20–0.90)
MONOCYTE %: 7 % (ref 4–11)
MPV: 9.1 fL (ref 7.9–10.8)
NEUTROPHIL #: 4.7 x10ˆ3/uL (ref 1.90–8.20)
NEUTROPHIL %: 73 % (ref 43–77)
PLATELETS: 163 x10ˆ3/uL (ref 140–440)
RBC: 4.51 x10ˆ6/uL (ref 3.63–4.92)
RDW: 12.9 % (ref 12.3–17.7)
WBC: 6.5 x10ˆ3/uL (ref 3.8–11.8)

## 2024-07-28 LAB — MAGNESIUM: MAGNESIUM: 2.1 mg/dL (ref 1.9–2.7)

## 2024-07-28 MED ORDER — SODIUM CHLORIDE 0.9% FLUSH BAG - 250 ML: INTRAVENOUS | Status: DC | PRN

## 2024-07-28 MED ORDER — MEPERIDINE (PF) 25 MG/ML INJECTION SOLUTION: 12.5000 mg | Freq: Once | INTRAMUSCULAR | Status: DC | PRN

## 2024-07-28 MED ORDER — SODIUM CHLORIDE 0.9 % INTRAVENOUS SOLUTION
91.2000 mg | Freq: Once | INTRAVENOUS | Status: AC
  Administered 2024-07-28: 0 mg via INTRAVENOUS
  Administered 2024-07-28: 90 mg via INTRAVENOUS
  Filled 2024-07-28: qty 9

## 2024-07-28 MED ORDER — ALBUTEROL SULFATE HFA 90 MCG/ACTUATION AEROSOL INHALER - RN
2.0000 | Freq: Once | RESPIRATORY_TRACT | Status: DC | PRN
  Filled 2024-07-28: qty 6.7

## 2024-07-28 MED ORDER — FAMOTIDINE (PF) 20 MG/2 ML INTRAVENOUS SOLUTION
20.0000 mg | Freq: Once | INTRAVENOUS | Status: DC | PRN
  Filled 2024-07-28: qty 2

## 2024-07-28 MED ORDER — ALBUTEROL SULFATE 2.5 MG/3 ML (0.083 %) SOLUTION FOR NEBULIZATION: 2.5000 mg | INHALATION_SOLUTION | Freq: Once | RESPIRATORY_TRACT | Status: DC | PRN

## 2024-07-28 MED ORDER — DEXAMETHASONE SODIUM PHOSPHATE (PF) 10 MG/ML INJECTION SOLUTION
10.0000 mg | Freq: Once | INTRAMUSCULAR | Status: AC
  Administered 2024-07-28: 10 mg via INTRAVENOUS
  Filled 2024-07-28: qty 1

## 2024-07-28 MED ORDER — DIPHENHYDRAMINE 50 MG/ML INJECTION SOLUTION
25.0000 mg | Freq: Once | INTRAMUSCULAR | Status: DC | PRN
  Filled 2024-07-28: qty 1

## 2024-07-28 MED ORDER — DEXTROSE 5% IN WATER (D5W) FLUSH BAG - 250 ML: INTRAVENOUS | Status: DC | PRN

## 2024-07-28 MED ORDER — DIPHENHYDRAMINE 50 MG/ML INJECTION SOLUTION
25.0000 mg | Freq: Once | INTRAMUSCULAR | Status: AC
  Administered 2024-07-28: 25 mg via INTRAVENOUS
  Filled 2024-07-28: qty 1

## 2024-07-28 MED ORDER — PALONOSETRON 0.25 MG/5 ML INTRAVENOUS SOLUTION
0.2500 mg | Freq: Once | INTRAVENOUS | Status: AC
  Administered 2024-07-28: 0.25 mg via INTRAVENOUS
  Filled 2024-07-28: qty 5

## 2024-07-28 MED ORDER — DIPHENHYDRAMINE 50 MG/ML INJECTION SOLUTION: 50.0000 mg | Freq: Once | INTRAMUSCULAR | Status: DC | PRN

## 2024-07-28 MED ORDER — SODIUM CHLORIDE 0.9 % INTRAVENOUS SOLUTION
50.0000 mg/m2 | Freq: Once | INTRAVENOUS | Status: AC
  Administered 2024-07-28: 0 mg via INTRAVENOUS
  Administered 2024-07-28: 81 mg via INTRAVENOUS
  Filled 2024-07-28: qty 13.5

## 2024-07-28 MED ORDER — FAMOTIDINE (PF) 20 MG/2 ML INTRAVENOUS SOLUTION
20.0000 mg | Freq: Once | INTRAVENOUS | Status: AC
  Administered 2024-07-28: 20 mg via INTRAVENOUS
  Filled 2024-07-28: qty 2

## 2024-07-28 MED ORDER — HYDROCORTISONE SOD SUCCINATE 100 MG/2 ML VIAL WRAPPER
100.0000 mg | Freq: Once | INTRAMUSCULAR | Status: DC | PRN
  Filled 2024-07-28: qty 2

## 2024-07-28 MED ORDER — SODIUM CHLORIDE 0.9 % IV BOLUS: 500.0000 mL | INJECTION | Freq: Once | Status: DC | PRN

## 2024-07-28 MED ORDER — EPINEPHRINE 1 MG/ML (1 ML) INJECTION SOLUTION: 0.3000 mg | Freq: Once | INTRAMUSCULAR | Status: DC | PRN

## 2024-07-28 NOTE — Nurses Notes (Signed)
 0833-Arrived to OP ONC in wheelchair and accompanied by spouse.  Here for labs and first infusions of Taxol  and Carboplatin .  To room 222. Alan Borrow, RN  [phone] completed.  Seated in vascular chair with wheels locked and call light within reach. Awake, alert, and and oriented x 3.  Verbalizes c/o pain in left hand from recent fall.  Pain described as sore and tender rated 5/10.  Also has trouble swallowing and has been eating a soft diet.  Appetite fair to poor.  Case of Ensure offered to pt and pt accepted.  No other voiced complaints offered.  Allergies, home medications, past medical/surgical history reviewed with pt.  Also reviewed Taxol  and Carboplatin  drug information included common side effects and possible adverse reactions.  Written information also provided.  Verbalized understanding.  HRR. Lungs clear.  Abdomen soft and non-tender with hyperactive bowel sounds.  No swelling noted in upper or lower extremities.  No acute distress.  VSS. Alan Borrow, RN  0858-Left upper chest PortACath accessed per protocol using sterile technique.  Excellent blood return and flush noted.  10ml blood wasted and then labs obtained from port.  Flushed with additional 10ml NS.  Transparent, bordered dressing applied and tubing secured.  Tolerated well.  Labs sent to main hospital lab through tube system. Alan Borrow, RN  1001-Aloxi  0.25mg  IVP administered per pre-medication order. Alan Borrow, RN  1003-Pepcid  20 mg IVP administered per pre-medication order. Alan Borrow, RN  1007-Dexamethasone  10 mg IVP administered per pre-medication order. Emmalou Hunger, RN  1013-Benadryl  25 mg IVP administered per pre-medication order. Alan Borrow, RN  1058-Taxol  81 mg IV infusion started. Alan Borrow, RN  1158-Taxol  infusion complete.  Tolerated well with no s/s of adverse reactions noted. Alan Borrow, RN  1216-Carboplatin  90 mg IV infusion started. Alan Borrow, RN  1316-Carboplatin  infusion complete.  Tolerated well with no s/s  of adverse reactions noted. Alan Borrow, RN  1355-Left OP ONC in wheelchair, accompanied by spouse.  VSS with no acute distress noted. Alan Borrow, RN

## 2024-07-29 DIAGNOSIS — C160 Malignant neoplasm of cardia: Secondary | ICD-10-CM

## 2024-08-02 ENCOUNTER — Ambulatory Visit (HOSPITAL_COMMUNITY): Payer: Self-pay

## 2024-08-04 ENCOUNTER — Other Ambulatory Visit (HOSPITAL_COMMUNITY): Payer: Self-pay

## 2024-08-04 ENCOUNTER — Encounter (HOSPITAL_COMMUNITY): Payer: Self-pay

## 2024-08-04 ENCOUNTER — Ambulatory Visit

## 2024-08-04 NOTE — Progress Notes (Signed)
 HOLD treatment on 12/31.     Will re-eval in clinic on 1/6 to determine whether to proceed with treatment on 1/7

## 2024-08-06 ENCOUNTER — Other Ambulatory Visit (HOSPITAL_COMMUNITY): Payer: Self-pay

## 2024-08-09 ENCOUNTER — Other Ambulatory Visit (HOSPITAL_COMMUNITY): Payer: Self-pay

## 2024-08-10 ENCOUNTER — Emergency Department
Admission: EM | Admit: 2024-08-10 | Discharge: 2024-08-10 | Disposition: A | Discharge status: Home / Self Care | Attending: Emergency Medicine | Admitting: Emergency Medicine

## 2024-08-10 ENCOUNTER — Encounter (HOSPITAL_COMMUNITY): Payer: Self-pay

## 2024-08-10 ENCOUNTER — Other Ambulatory Visit: Payer: Self-pay

## 2024-08-10 ENCOUNTER — Emergency Department (HOSPITAL_COMMUNITY)

## 2024-08-10 DIAGNOSIS — K5790 Diverticulosis of intestine, part unspecified, without perforation or abscess without bleeding: Secondary | ICD-10-CM | POA: Insufficient documentation

## 2024-08-10 DIAGNOSIS — K229 Disease of esophagus, unspecified: Secondary | ICD-10-CM

## 2024-08-10 DIAGNOSIS — M47812 Spondylosis without myelopathy or radiculopathy, cervical region: Secondary | ICD-10-CM | POA: Insufficient documentation

## 2024-08-10 DIAGNOSIS — I7090 Unspecified atherosclerosis: Secondary | ICD-10-CM | POA: Insufficient documentation

## 2024-08-10 DIAGNOSIS — Z79899 Other long term (current) drug therapy: Secondary | ICD-10-CM | POA: Insufficient documentation

## 2024-08-10 DIAGNOSIS — K5900 Constipation, unspecified: Secondary | ICD-10-CM

## 2024-08-10 DIAGNOSIS — Z90710 Acquired absence of both cervix and uterus: Secondary | ICD-10-CM | POA: Insufficient documentation

## 2024-08-10 DIAGNOSIS — C159 Malignant neoplasm of esophagus, unspecified: Secondary | ICD-10-CM | POA: Insufficient documentation

## 2024-08-10 DIAGNOSIS — C7889 Secondary malignant neoplasm of other digestive organs: Secondary | ICD-10-CM | POA: Insufficient documentation

## 2024-08-10 DIAGNOSIS — K5909 Other constipation: Secondary | ICD-10-CM | POA: Insufficient documentation

## 2024-08-10 DIAGNOSIS — K8020 Calculus of gallbladder without cholecystitis without obstruction: Secondary | ICD-10-CM

## 2024-08-10 LAB — COMPREHENSIVE METABOLIC PANEL, NON-FASTING
ALBUMIN/GLOBULIN RATIO: 2 — ABNORMAL HIGH (ref 0.8–1.4)
ALBUMIN: 3.9 g/dL (ref 3.5–5.7)
ALKALINE PHOSPHATASE: 227 U/L — ABNORMAL HIGH (ref 34–104)
ALT (SGPT): 21 U/L (ref 7–52)
ANION GAP: 8 mmol/L (ref 4–13)
AST (SGOT): 28 U/L (ref 13–39)
BILIRUBIN TOTAL: 0.6 mg/dL (ref 0.3–1.0)
BUN/CREA RATIO: 16 (ref 6–22)
BUN: 28 mg/dL — ABNORMAL HIGH (ref 7–25)
CALCIUM, CORRECTED: 9.5 mg/dL (ref 8.9–10.8)
CALCIUM: 9.4 mg/dL (ref 8.6–10.3)
CHLORIDE: 107 mmol/L (ref 98–107)
CO2 TOTAL: 24 mmol/L (ref 21–31)
CREATININE: 1.79 mg/dL — ABNORMAL HIGH (ref 0.60–1.30)
ESTIMATED GFR: 29 mL/min/1.73mˆ2 — ABNORMAL LOW (ref >59)
GLOBULIN: 2 (ref 2.0–3.5)
GLUCOSE: 85 mg/dL (ref 74–109)
OSMOLALITY, CALCULATED: 282 mosm/kg (ref 270–290)
POTASSIUM: 5.1 mmol/L (ref 3.5–5.1)
PROTEIN TOTAL: 5.9 g/dL — ABNORMAL LOW (ref 6.4–8.9)
SODIUM: 139 mmol/L (ref 136–145)

## 2024-08-10 LAB — CBC WITH DIFF
BASOPHIL #: 0 x10ˆ3/uL (ref 0.00–0.10)
BASOPHIL %: 0 % (ref 0–1)
EOSINOPHIL #: 0.1 x10ˆ3/uL (ref 0.00–0.50)
EOSINOPHIL %: 3 % (ref 1–7)
HCT: 43 % — ABNORMAL HIGH (ref 31.2–41.9)
HGB: 14.9 g/dL — ABNORMAL HIGH (ref 10.9–14.3)
LYMPHOCYTE #: 0.5 x10ˆ3/uL — ABNORMAL LOW (ref 1.10–3.10)
LYMPHOCYTE %: 13 % — ABNORMAL LOW (ref 16–46)
MCH: 32.9 pg — ABNORMAL HIGH (ref 24.7–32.8)
MCHC: 34.7 g/dL (ref 32.3–35.6)
MCV: 94.6 fL (ref 75.5–95.3)
MONOCYTE #: 0.5 x10ˆ3/uL (ref 0.20–0.90)
MONOCYTE %: 13 % — ABNORMAL HIGH (ref 4–11)
MPV: 9.1 fL (ref 7.9–10.8)
NEUTROPHIL #: 2.9 x10ˆ3/uL (ref 1.90–8.20)
NEUTROPHIL %: 71 % (ref 43–77)
PLATELETS: 174 x10ˆ3/uL (ref 140–440)
RBC: 4.54 x10ˆ6/uL (ref 3.63–4.92)
RDW: 12.5 % (ref 12.3–17.7)
WBC: 4.1 x10ˆ3/uL (ref 3.8–11.8)

## 2024-08-10 LAB — LACTIC ACID LEVEL W/ REFLEX FOR LEVEL >2.0: LACTIC ACID: 1 mmol/L (ref 0.5–2.2)

## 2024-08-10 LAB — MAGNESIUM: MAGNESIUM: 2.3 mg/dL (ref 1.9–2.7)

## 2024-08-10 MED ORDER — LACTULOSE 10 GRAM/15 ML ORAL SOLUTION
45.0000 mL | ORAL | Status: AC
  Administered 2024-08-10: 45 mL via ORAL

## 2024-08-10 MED ORDER — POLYETHYLENE GLYCOL 3350 17 GRAM/DOSE ORAL POWDER: 17.0000 g | Freq: Two times a day (BID) | ORAL | 1 refills | Status: AC

## 2024-08-10 MED ORDER — MAGNESIUM CITRATE ORAL SOLUTION
ORAL | Status: AC
  Filled 2024-08-10: qty 296

## 2024-08-10 MED ORDER — LACTULOSE 10 GRAM/15 ML ORAL SOLUTION
ORAL | Status: AC
  Filled 2024-08-10: qty 45

## 2024-08-10 MED ORDER — MINERAL OIL ENEMA
133.0000 mL | ENEMA | RECTAL | Status: AC
  Administered 2024-08-10: 133 mL via RECTAL
  Filled 2024-08-10: qty 133

## 2024-08-10 MED ORDER — MAGNESIUM CITRATE ORAL SOLUTION: 296.0000 mL | Freq: Once | ORAL | 0 refills | Status: AC

## 2024-08-10 MED ORDER — MAGNESIUM CITRATE ORAL SOLUTION
296.0000 mL | ORAL | Status: AC
  Administered 2024-08-10: 296 mL via ORAL

## 2024-08-10 NOTE — ED Triage Notes (Addendum)
 Sent to ED by [facility] for evaluation  Constipation x 2 weeks. No relief with OTC treatment.     Currently receiving chemo and radiation

## 2024-08-10 NOTE — ED Nurses Note (Signed)
 Patient to ED 23 via ambulation with complaints of constipation X2 weeks. Patient states she was sent here from [facility]. Patient states she is currently on chemotherapy and radiation for cancer between my esophagus and stomach. Patient states her last chemo was two weeks ago. Patient states she has used Miralax  OTC without any relief. Patient states she is able to get very small amounts of liquid/soft stool out but I know there's more in there. Patient denies any current abdominal pain or N/V. Patient placed on bedside monitoring. Assessment as documented. Call bell within reach.

## 2024-08-10 NOTE — ED Nurses Note (Signed)

## 2024-08-10 NOTE — Discharge Instructions (Signed)

## 2024-08-10 NOTE — ED Provider Notes (Signed)
 [facility]  ED Primary Provider Note  Patient Name: Paula Cochran  Patient Age: 79 y.o.  Date of Birth: 02/25/1946    Chief Complaint: Constipation        History of Present Illness       Paula Cochran is a 79 y.o. female who had concerns including Constipation.     History of Present Illness  Paula Cochran is a 79 year old female who presents with constipation for two weeks. She was referred by her PCP for further evaluation.    She has not had a bowel movement for approximately two weeks. She has a history of a normal colonoscopy performed two to three years ago.    She has attempted to manage her symptoms with Miralax  and Senna, each taken once, but has not experienced relief.            Review of Systems     No other overt Review of Systems are noted to be positive except noted in the HPI.      Historical Data   History Reviewed This Encounter:        Physical Exam   ED Triage Vitals [08/10/24 1206]   BP (Non-Invasive) 114/66   Heart Rate 80   Respiratory Rate 16   Temperature 36.5 C (97.7 F)   SpO2 99 %   Weight 61.2 kg (135 lb)   Height 1.575 m (5' 2)         Nursing notes reviewed for what could be assessed. Past Medical, Surgical, and Social history reviewed.     Constitutional: NAD. Well-Developed. Well Nourished.  Head: Normocephalic, atraumatic.  Ears:  Normal to exterior evaluation  Eyes: EOM grossly intact, conjunctiva normal.  Neck: Supple  Cardiovascular: Extremities well perfused.  Pulmonary/Chest: No respiratory distress.   Abdominal: Non-distended. No overt peritoneal findings.   MSK: No Lower Extremity Edema.  Skin: Warm, dry.  Neuro: Appropriate, CN II-XII grossly intact.  Alert  Psych: Pleasant        Physical Exam          Procedures      Patient Data     Labs Ordered/Reviewed   COMPREHENSIVE METABOLIC PANEL, NON-FASTING - Abnormal; Notable for the following components:       Result Value    BUN 28 (*)     CREATININE 1.79 (*)     ESTIMATED GFR 29 (*)      ALKALINE PHOSPHATASE 227 (*)     PROTEIN TOTAL 5.9 (*)     ALBUMIN/GLOBULIN RATIO 2.0 (*)     All other components within normal limits    Narrative:     Estimated Glomerular Filtration Rate (eGFR) is calculated using the CKD-EPI (2021) equation, intended for patients 43 years of age and older. If gender is not documented or unknown, there will be no eGFR calculation.     CBC WITH DIFF - Abnormal; Notable for the following components:    HGB 14.9 (*)     HCT 43.0 (*)     MCH 32.9 (*)     LYMPHOCYTE % 13 (*)     MONOCYTE % 13 (*)     LYMPHOCYTE # 0.50 (*)     All other components within normal limits   MAGNESIUM  - Normal   LACTIC ACID LEVEL W/ REFLEX FOR LEVEL >2.0 - Normal   CBC/DIFF    Narrative:     The following orders were created for panel order CBC/DIFF.  Procedure                               Abnormality         Status                     ---------                               -----------         ------                     CBC WITH IPQQ[[id number]]                Abnormal            Final result                 Please view results for these tests on the individual orders.   URINALYSIS, MACROSCOPIC AND MICROSCOPIC W/CULTURE REFLEX    Narrative:     The following orders were created for panel order URINALYSIS, MACROSCOPIC AND MICROSCOPIC W/CULTURE REFLEX.  Procedure                               Abnormality         Status                     ---------                               -----------         ------                     URINALYSIS, MACROSCOPIC[[id number]]                                                     URINALYSIS, MICROSCOPIC[[id number]]                                                       Please view results for these tests on the individual orders.   URINALYSIS, MACROSCOPIC   URINALYSIS, MICROSCOPIC       CT ABDOMEN PELVIS WO IV CONTRAST   Final Result by Edi, Radresults In (01/06 1320)   Moderate to large stool burden.      Wall thickening of the distal esophagus.      Other findings as  above.               Radiologist location ID: [id number]             Medical Decision Making          Medical Decision Making  Risk  OTC drugs.  Prescription drug management.        Results          Studies Assessed:  Lab, radiology        MDM Narrative:  Medical Decision Making  79 year old female with a history of normal colonoscopy 2-3 years ago presents with approximately two weeks of constipation and minimal laxative use. She denies any recent changes in her colonoscopy findings. Abdominal examination was performed during the encounter.    Differential diagnosis includes, but is not limited to:  - Slow transit constipation: Chronic constipation without alarming features and minimal laxative use suggests slow colonic transit as a possible etiology.  - Bowel obstruction: Bowel obstruction is considered due to prolonged absence of bowel movements, warranting further evaluation with imaging and labs.  - Metabolic abnormality: Metabolic derangements such as electrolyte disturbances may contribute to constipation and are considered in the differential.  - Infectious etiology: Infectious causes of constipation are considered, though less likely given the chronicity and lack of systemic symptoms.    Chronic constipation  - Order blood work and abdominal scan to evaluate for underlying causes.  - Increase laxative dosage if imaging and labs are unremarkable to facilitate bowel movement.        Follow-Up Discussion:  The patient had a profound bowel movement after laxative therapy.  Education was given in regards to continual therapy.    Please see documentation above for specific labs and radiology.    Consent for AI Scribe software [employer] discussed/obtained verbally for encounter                                Medications Ordered/Administered in the ED   mineral oil (FLEET) RETENTION ENEMA (has no administration in time range)   magnesium  citrate (CITROMA) oral liquid (has no administration in time range)    lactulose  (ENULOSE ) 10g per 15mL oral liquid (has no administration in time range)       Following the history, physical exam, and ED workup, the patient was deemed stable and suitable for discharge. The patient/caregiver was advised to return to the ED for any new or worsening symptoms. Discharge medications, and follow-up instructions were discussed with the patient/caregiver in detail, who verbalizes understanding. The patient/caregiver is in agreement and is comfortable with the plan of care.    Disposition: Discharged         Current Discharge Medication List        START taking these medications.        Details   magnesium  citrate Solution  Commonly known as: CITROMA   296 mL, Oral, ONCE  Qty: 296 mL  Refills: 0     polyethylene glycol 17 gram/dose Powder  Commonly known as: MIRALAX    17 g, Oral, EVERY 12 HOURS, Titrate to 2 pudding consistency stools daily.  Qty: 510 g  Refills: 1            CONTINUE these medications - NO CHANGES were made during your visit.        Details   Advair HFA 115-21 mcg/actuation oral inhaler  Generic drug: fluticasone propion-salmeteroL   2 Puffs, EVERY 12 HOURS  Refills: 0     albuterol  sulfate 90 mcg/actuation oral inhaler  Commonly known as: PROVENTIL  or VENTOLIN  or PROAIR    1-2 Puffs, EVERY 6 HOURS PRN  Refills: 0     busPIRone 5 mg Tablet  Commonly known as: BUSPAR   5 mg, 2 TIMES DAILY  Refills: 0     calcitrioL  0.25 mcg Capsule  Commonly known as: ROCALTROL    0.25 mcg, Oral, Daily  Qty: 90 Capsule  Refills: 0     Compazine  10 mg Tablet  Generic drug: prochlorperazine    10 mg, EVERY 6 HOURS PRN  Refills: 0     empagliflozin  10 mg Tablet  Commonly known as: JARDIANCE    10 mg, Oral, Daily  Qty: 90 Tablet  Refills: 3     famotidine  40 mg Tablet  Commonly known as: PEPCID    40 mg, NIGHTLY  Refills: 0     furosemide  40 mg Tablet  Commonly known as: LASIX    40 mg, Oral, DAILY PRN  Refills: 0     Ingrezza 40 mg Capsule  Generic drug: valbenazine   1 Capsule, Daily  Refills: 0      lamoTRIgine  200 mg Tablet  Commonly known as: LaMICtal    200 mg, Daily  Refills: 0     LORazepam  1 mg Tablet  Commonly known as: ATIVAN    1 mg, NIGHTLY  Refills: 0     Mirtazapine 7.5 mg Tablet  Commonly known as: REMERON   7.5 mg, NIGHTLY  Refills: 0     ondansetron  8 mg Tablet  Commonly known as: ZOFRAN    8 mg, EVERY 8 HOURS PRN  Refills: 0     pantoprazole  40 mg Tablet, Delayed Release (E.C.)  Commonly known as: PROTONIX    40 mg, 2 TIMES DAILY  Refills: 0     potassium chloride 20 mEq Tab Sust.Rel. Particle/Crystal  Commonly known as: K-DUR   20 mEq, EVERY OTHER DAY  Refills: 0            Follow up:   No follow-up provider specified.             Risk as noted in the medical decision-making is in reference to potential morbidity/mortality of management based upon previously established billing guidelines.    Based upon the clinical setting, the likely diagnosis/impression include:    Clinical Impression   Constipation, unspecified constipation type (Primary)         Current Discharge Medication List        START taking these medications    Details   magnesium  citrate (CITROMA) Oral Solution Take 296 mL by mouth One time for 1 dose  Qty: 296 mL, Refills: 0      polyethylene glycol (MIRALAX ) 17 gram/dose Oral Powder Take 3 teaspoons (17 g total) by mouth Every 12 hours Titrate to 2 pudding consistency stools daily.  Qty: 510 g, Refills: 1                   This note was partially created using voice recognition software and is inherently subject to errors including those of syntax and sound alike  substitutions which may escape proof reading. In such instances, original meaning may be extrapolated by contextual derivation.      /R. Elspeth Griffin, MD, LYDIA PILLING  Department of Emergency Medicine  [facility]

## 2024-08-10 NOTE — ED Attending Handoff Note (Signed)
 [facility] - Emergency Department   Provider in Triage Note     Patient Name: Paula Cochran  Patient MRN: [id number]  Date and Time of Assessment: 08/10/2024 12:17     Chief Complaint   Patient presents with    Constipation       Brief HPI:  79 year old female presents to the ED for constipation x2 weeks.  She reports no bowel movement over the past 2 weeks despite attempted use over-the-counter medications.  She reports decreased appetite and p.o. intake however no nausea or vomiting.  Denies urinary symptoms.  Does have some lower abdominal discomfort.  No flank or back pain.  No fevers or chills.  Denies similar constipation in past.  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  Follows with our cancer center.    Focused Physical Exam:   ED Triage Vitals [08/10/24 1206]   BP (Non-Invasive) 114/66   Heart Rate 80   Respiratory Rate 16   Temperature 36.5 C (97.7 F)   SpO2 99 %   Weight 61.2 kg (135 lb)   Height 1.575 m (5' 2)     Patient ambulates without difficulty.  Converses appropriately.  Head normocephalic.  Pupils equal and round bilaterally.  Mucous membranes moist.  No tachypnea or increased work of breathing.  Skin warm and well perfused.  Abdomen nontender.  Moves all extremities symmetrically.  In no acute distress.    Preliminary Plan:  Patient roomed in ED due to current chemotherapy and need for isolation.  Orders have been placed for initial workup.  Care will be continued once roomed in the main ED.      Leron Holms, DO

## 2024-08-10 NOTE — ED Nurses Note (Signed)
 After multiple soaps suds enemas, mineral oil enema, magnesium  citrate, lactulose , and manual disimpaction, patient had very large stool removed. Provider notified.

## 2024-08-11 ENCOUNTER — Ambulatory Visit
Admission: RE | Admit: 2024-08-11 | Discharge: 2024-08-11 | Disposition: A | Discharge status: Home / Self Care | Source: Ambulatory Visit | Attending: FAMILY PRACTICE | Admitting: FAMILY PRACTICE

## 2024-08-11 ENCOUNTER — Encounter (HOSPITAL_COMMUNITY): Payer: Self-pay

## 2024-08-11 VITALS — BP 120/78 | HR 82 | Temp 97.6°F | Resp 18

## 2024-08-11 DIAGNOSIS — Z5111 Encounter for antineoplastic chemotherapy: Secondary | ICD-10-CM | POA: Insufficient documentation

## 2024-08-11 DIAGNOSIS — C160 Malignant neoplasm of cardia: Secondary | ICD-10-CM | POA: Insufficient documentation

## 2024-08-11 MED ORDER — SODIUM CHLORIDE 0.9% FLUSH BAG - 250 ML: INTRAVENOUS | Status: DC | PRN

## 2024-08-11 MED ORDER — SODIUM CHLORIDE 0.9 % IV BOLUS: 500.0000 mL | INJECTION | Freq: Once | Status: DC | PRN

## 2024-08-11 MED ORDER — MEPERIDINE (PF) 25 MG/ML INJECTION SOLUTION: 12.5000 mg | Freq: Once | INTRAMUSCULAR | Status: DC | PRN

## 2024-08-11 MED ORDER — EPINEPHRINE 1 MG/ML (1 ML) INJECTION SOLUTION: 0.3000 mg | Freq: Once | INTRAMUSCULAR | Status: DC | PRN

## 2024-08-11 MED ORDER — DIPHENHYDRAMINE 50 MG/ML INJECTION SOLUTION: 50.0000 mg | Freq: Once | INTRAMUSCULAR | Status: DC | PRN

## 2024-08-11 MED ORDER — SODIUM CHLORIDE 0.9 % INTRAVENOUS SOLUTION
75.9000 mg | Freq: Once | INTRAVENOUS | Status: AC
  Administered 2024-08-11: 0 mg via INTRAVENOUS
  Administered 2024-08-11: 75 mg via INTRAVENOUS
  Filled 2024-08-11: qty 7.5

## 2024-08-11 MED ORDER — DIPHENHYDRAMINE 50 MG/ML INJECTION SOLUTION
25.0000 mg | Freq: Once | INTRAMUSCULAR | Status: AC
  Administered 2024-08-11: 25 mg via INTRAVENOUS
  Filled 2024-08-11: qty 1

## 2024-08-11 MED ORDER — ALBUTEROL SULFATE 2.5 MG/3 ML (0.083 %) SOLUTION FOR NEBULIZATION: 2.5000 mg | INHALATION_SOLUTION | Freq: Once | RESPIRATORY_TRACT | Status: DC | PRN

## 2024-08-11 MED ORDER — SODIUM CHLORIDE 0.9 % INTRAVENOUS SOLUTION
50.0000 mg/m2 | Freq: Once | INTRAVENOUS | Status: AC
  Administered 2024-08-11: 0 mg via INTRAVENOUS
  Administered 2024-08-11: 81 mg via INTRAVENOUS
  Filled 2024-08-11: qty 13.5

## 2024-08-11 MED ORDER — PALONOSETRON 0.25 MG/5 ML INTRAVENOUS SOLUTION
0.2500 mg | Freq: Once | INTRAVENOUS | Status: AC
  Administered 2024-08-11: 0.25 mg via INTRAVENOUS
  Filled 2024-08-11: qty 5

## 2024-08-11 MED ORDER — FAMOTIDINE (PF) 20 MG/2 ML INTRAVENOUS SOLUTION: 20.0000 mg | Freq: Once | INTRAVENOUS | Status: DC | PRN

## 2024-08-11 MED ORDER — DEXAMETHASONE SODIUM PHOSPHATE (PF) 10 MG/ML INJECTION SOLUTION
10.0000 mg | Freq: Once | INTRAMUSCULAR | Status: AC
  Administered 2024-08-11: 10 mg via INTRAVENOUS
  Filled 2024-08-11: qty 1

## 2024-08-11 MED ORDER — DEXTROSE 5% IN WATER (D5W) FLUSH BAG - 250 ML: INTRAVENOUS | Status: DC | PRN

## 2024-08-11 MED ORDER — DIPHENHYDRAMINE 50 MG/ML INJECTION SOLUTION: 25.0000 mg | Freq: Once | INTRAMUSCULAR | Status: DC | PRN

## 2024-08-11 MED ORDER — ALBUTEROL SULFATE HFA 90 MCG/ACTUATION AEROSOL INHALER - RN: 2.0000 | Freq: Once | RESPIRATORY_TRACT | Status: DC | PRN

## 2024-08-11 MED ORDER — HYDROCORTISONE SOD SUCCINATE 100 MG/2 ML VIAL WRAPPER: 100.0000 mg | Freq: Once | INTRAMUSCULAR | Status: DC | PRN

## 2024-08-11 MED ORDER — SODIUM CHLORIDE 0.9 % IV BOLUS
1000.0000 mL | INJECTION | Freq: Once | Status: AC
  Administered 2024-08-11: 1000 mL via INTRAVENOUS
  Administered 2024-08-11: 0 mL via INTRAVENOUS

## 2024-08-11 MED ORDER — FAMOTIDINE (PF) 20 MG/2 ML INTRAVENOUS SOLUTION
20.0000 mg | Freq: Once | INTRAVENOUS | Status: AC
  Administered 2024-08-11: 20 mg via INTRAVENOUS
  Filled 2024-08-11: qty 2

## 2024-08-11 NOTE — Nurses Notes (Signed)
 9150 patient arrived to floor via wheelchair accompanied by family member. Patient here for chemotherapy infusion. Patient to room 227B. Leandrew Kitty, RN  [phone] VSS. Assessment completed. WDL. Lungs clear, abodmen soft and non-tender. Bowel sounds normo active. Patient to ED 08/10/24 due to constipation. Patient had BM per nurses note and states she has had frequent small ones since then. Poor appetite due to false teeth, and nothing taste good. Dr.. Dorthy notified by Charge nurse. Leandrew Kitty, RN  [phone] orders released per Dr. Dorthy orders. One liter NS to be given due to BUN. Leandrew Kitty, RN  Patient Assessment/Symptom Management Patient Has No MD Appointment Today   Key: (+) Symptom present           (-)  Symptom not present If Symptom is Positive(+) a Nursing Note is required   Edema -   Uncontrolled Nausea -   Vomiting -   Inability to eat/drink -   Mouth Sores -   Diarrhea -   Constipation (? Last BM) +   Fatigue that interferes with ADL's +   Numbness/Tingling -change -   Other -   Fever/Signs & Symptoms of infection -   Nurse Initials EB       [phone] dexamethasone , benadryl , Pepcid , and aloxi  given IVP. Leandrew Kitty, RN  1036 taxol  infusion started. Leandrew Kitty, RN  1136 taxol  infusion completed. Patient tolerated well. Leandrew Kitty, RN  1139 carboplatin  infusion started. Leandrew Kitty, RN  1141 ns liter started. Leandrew Kitty, RN  1239 carboplatin  infusion completed patient tolerated well. VSS Sharlon Pfohl, RN  1241 ns liter completed. Leandrew Kitty, RN  [phone] patient ambulatory to bathroom. Leandrew Kitty, RN  [phone] patient family member back to floor, right chest port deaccessed. Clothing changed.SABRA Leandrew Kitty, RN  [phone] patient left floor via wheelchair. Leandrew Kitty, RN

## 2024-08-13 ENCOUNTER — Other Ambulatory Visit (HOSPITAL_COMMUNITY): Payer: Self-pay

## 2024-08-13 DIAGNOSIS — C160 Malignant neoplasm of cardia: Secondary | ICD-10-CM

## 2024-08-18 ENCOUNTER — Other Ambulatory Visit: Payer: Self-pay

## 2024-08-18 ENCOUNTER — Ambulatory Visit
Admission: RE | Admit: 2024-08-18 | Discharge: 2024-08-18 | Disposition: A | Discharge status: Home / Self Care | Payer: Self-pay | Source: Ambulatory Visit

## 2024-08-18 VITALS — BP 117/58 | HR 76 | Temp 96.8°F | Resp 18 | Ht 62.0 in | Wt 127.0 lb

## 2024-08-18 DIAGNOSIS — C160 Malignant neoplasm of cardia: Secondary | ICD-10-CM | POA: Insufficient documentation

## 2024-08-18 DIAGNOSIS — Z5111 Encounter for antineoplastic chemotherapy: Secondary | ICD-10-CM | POA: Insufficient documentation

## 2024-08-18 LAB — CBC WITH DIFF
BASOPHIL #: 0 x10ˆ3/uL (ref 0.00–0.10)
BASOPHIL %: 0 % (ref 0–1)
EOSINOPHIL #: 0.1 x10ˆ3/uL (ref 0.00–0.50)
EOSINOPHIL %: 3 % (ref 1–7)
HCT: 40.2 % (ref 31.2–41.9)
HGB: 14.2 g/dL (ref 10.9–14.3)
LYMPHOCYTE #: 0.3 x10ˆ3/uL — ABNORMAL LOW (ref 1.10–3.10)
LYMPHOCYTE %: 12 % — ABNORMAL LOW (ref 16–46)
MCH: 33.5 pg — ABNORMAL HIGH (ref 24.7–32.8)
MCHC: 35.4 g/dL (ref 32.3–35.6)
MCV: 94.7 fL (ref 75.5–95.3)
MONOCYTE #: 0.3 x10ˆ3/uL (ref 0.20–0.90)
MONOCYTE %: 10 % (ref 4–11)
MPV: 9.3 fL (ref 7.9–10.8)
NEUTROPHIL #: 2.1 x10ˆ3/uL (ref 1.90–8.20)
NEUTROPHIL %: 74 % (ref 43–77)
PLATELETS: 131 x10ˆ3/uL — ABNORMAL LOW (ref 140–440)
RBC: 4.24 x10ˆ6/uL (ref 3.63–4.92)
RDW: 12.6 % (ref 12.3–17.7)
WBC: 2.8 x10ˆ3/uL — ABNORMAL LOW (ref 3.8–11.8)

## 2024-08-18 LAB — COMPREHENSIVE METABOLIC PANEL, NON-FASTING
ALBUMIN/GLOBULIN RATIO: 1.9 — ABNORMAL HIGH (ref 0.8–1.4)
ALBUMIN: 3.7 g/dL (ref 3.5–5.7)
ALKALINE PHOSPHATASE: 239 U/L — ABNORMAL HIGH (ref 34–104)
ALT (SGPT): 26 U/L (ref 7–52)
ANION GAP: 6 mmol/L (ref 4–13)
AST (SGOT): 27 U/L (ref 13–39)
BILIRUBIN TOTAL: 0.5 mg/dL (ref 0.3–1.0)
BUN/CREA RATIO: 18 (ref 6–22)
BUN: 31 mg/dL — ABNORMAL HIGH (ref 7–25)
CALCIUM, CORRECTED: 9.3 mg/dL (ref 8.9–10.8)
CALCIUM: 9.1 mg/dL (ref 8.6–10.3)
CHLORIDE: 108 mmol/L — ABNORMAL HIGH (ref 98–107)
CO2 TOTAL: 25 mmol/L (ref 21–31)
CREATININE: 1.72 mg/dL — ABNORMAL HIGH (ref 0.60–1.30)
ESTIMATED GFR: 30 mL/min/1.73mˆ2 — ABNORMAL LOW (ref >59)
GLOBULIN: 1.9 — ABNORMAL LOW (ref 2.0–3.5)
GLUCOSE: 100 mg/dL (ref 74–109)
OSMOLALITY, CALCULATED: 284 mosm/kg (ref 270–290)
POTASSIUM: 4.8 mmol/L (ref 3.5–5.1)
PROTEIN TOTAL: 5.6 g/dL — ABNORMAL LOW (ref 6.4–8.9)
SODIUM: 139 mmol/L (ref 136–145)

## 2024-08-18 LAB — MAGNESIUM: MAGNESIUM: 2.1 mg/dL (ref 1.9–2.7)

## 2024-08-18 MED ORDER — MEPERIDINE (PF) 25 MG/ML INJECTION SOLUTION: 12.5000 mg | Freq: Once | INTRAMUSCULAR | Status: DC | PRN

## 2024-08-18 MED ORDER — HYDROCORTISONE SOD SUCCINATE 100 MG/2 ML VIAL WRAPPER: 100.0000 mg | Freq: Once | INTRAMUSCULAR | Status: DC | PRN

## 2024-08-18 MED ORDER — FAMOTIDINE (PF) 20 MG/2 ML INTRAVENOUS SOLUTION
20.0000 mg | Freq: Once | INTRAVENOUS | Status: AC
  Administered 2024-08-18: 20 mg via INTRAVENOUS
  Filled 2024-08-18: qty 2

## 2024-08-18 MED ORDER — EPINEPHRINE 1 MG/ML (1 ML) INJECTION SOLUTION: 0.3000 mg | Freq: Once | INTRAMUSCULAR | Status: DC | PRN

## 2024-08-18 MED ORDER — DIPHENHYDRAMINE 50 MG/ML INJECTION SOLUTION
25.0000 mg | Freq: Once | INTRAMUSCULAR | Status: AC
  Administered 2024-08-18: 25 mg via INTRAVENOUS
  Filled 2024-08-18: qty 1

## 2024-08-18 MED ORDER — SODIUM CHLORIDE 0.9 % IV BOLUS
1000.0000 mL | INJECTION | Freq: Once | Status: AC
  Administered 2024-08-18: 1000 mL via INTRAVENOUS
  Administered 2024-08-18: 0 mL via INTRAVENOUS

## 2024-08-18 MED ORDER — SODIUM CHLORIDE 0.9% FLUSH BAG - 250 ML: INTRAVENOUS | Status: DC | PRN

## 2024-08-18 MED ORDER — SODIUM CHLORIDE 0.9 % INTRAVENOUS SOLUTION
77.5500 mg | Freq: Once | INTRAVENOUS | Status: AC
  Administered 2024-08-18: 80 mg via INTRAVENOUS
  Administered 2024-08-18: 0 mg via INTRAVENOUS
  Filled 2024-08-18: qty 8

## 2024-08-18 MED ORDER — SODIUM CHLORIDE 0.9 % INTRAVENOUS SOLUTION
50.0000 mg/m2 | Freq: Once | INTRAVENOUS | Status: AC
  Administered 2024-08-18: 0 mg via INTRAVENOUS
  Administered 2024-08-18: 81 mg via INTRAVENOUS
  Filled 2024-08-18: qty 13.5

## 2024-08-18 MED ORDER — DEXAMETHASONE SODIUM PHOSPHATE (PF) 10 MG/ML INJECTION SOLUTION
10.0000 mg | Freq: Once | INTRAMUSCULAR | Status: AC
  Administered 2024-08-18: 10 mg via INTRAVENOUS
  Filled 2024-08-18: qty 1

## 2024-08-18 MED ORDER — DIPHENHYDRAMINE 50 MG/ML INJECTION SOLUTION: 50.0000 mg | Freq: Once | INTRAMUSCULAR | Status: DC | PRN

## 2024-08-18 MED ORDER — DEXTROSE 5% IN WATER (D5W) FLUSH BAG - 250 ML: INTRAVENOUS | Status: DC | PRN

## 2024-08-18 MED ORDER — SODIUM CHLORIDE 0.9 % IV BOLUS: 500.0000 mL | INJECTION | Freq: Once | Status: DC | PRN

## 2024-08-18 MED ORDER — DIPHENHYDRAMINE 50 MG/ML INJECTION SOLUTION: 25.0000 mg | Freq: Once | INTRAMUSCULAR | Status: DC | PRN

## 2024-08-18 MED ORDER — ALBUTEROL SULFATE HFA 90 MCG/ACTUATION AEROSOL INHALER - RN: 2.0000 | Freq: Once | RESPIRATORY_TRACT | Status: DC | PRN

## 2024-08-18 MED ORDER — ALBUTEROL SULFATE 2.5 MG/3 ML (0.083 %) SOLUTION FOR NEBULIZATION: 2.5000 mg | INHALATION_SOLUTION | Freq: Once | RESPIRATORY_TRACT | Status: DC | PRN

## 2024-08-18 MED ORDER — FAMOTIDINE (PF) 20 MG/2 ML INTRAVENOUS SOLUTION: 20.0000 mg | Freq: Once | INTRAVENOUS | Status: DC | PRN

## 2024-08-18 MED ORDER — PALONOSETRON 0.25 MG/5 ML INTRAVENOUS SOLUTION
0.2500 mg | Freq: Once | INTRAVENOUS | Status: AC
  Administered 2024-08-18: 0.25 mg via INTRAVENOUS
  Filled 2024-08-18: qty 5

## 2024-08-18 NOTE — Nurses Notes (Signed)
 [phone] Patient came in via w/c with husband for treatment. Went to radiation treatment first at [facility]. Port accessed. Labs drawn via port. Patient denies any problems. Labs resulted. Bun 31. Cr 1.72. Dr. Dorthy notified. Order to continue with treatment and give 1 liter NS bolus over an hour. Premeds given. Taxol  given over one hour. Tolerated well. NS 1 L given over an hour. Carbo given over one hour. Tolerated well. No reactions noted. Line flushed and discontinued. Site clear. Left unit via w/c with husband. No c/o. Sharlet Montclair, RN

## 2024-08-23 ENCOUNTER — Other Ambulatory Visit: Payer: Self-pay

## 2024-08-23 ENCOUNTER — Other Ambulatory Visit (HOSPITAL_COMMUNITY): Payer: Self-pay

## 2024-08-23 ENCOUNTER — Ambulatory Visit (INDEPENDENT_AMBULATORY_CARE_PROVIDER_SITE_OTHER): Payer: Self-pay

## 2024-08-23 ENCOUNTER — Other Ambulatory Visit: Attending: Nephrology | Admitting: Nephrology

## 2024-08-23 DIAGNOSIS — N184 Chronic kidney disease, stage 4 (severe): Secondary | ICD-10-CM | POA: Insufficient documentation

## 2024-08-23 DIAGNOSIS — E559 Vitamin D deficiency, unspecified: Secondary | ICD-10-CM | POA: Insufficient documentation

## 2024-08-23 DIAGNOSIS — C160 Malignant neoplasm of cardia: Secondary | ICD-10-CM

## 2024-08-23 LAB — CBC WITH DIFF
BASOPHIL #: 0 x10ˆ3/uL (ref 0.00–0.10)
BASOPHIL %: 0 % (ref 0–1)
EOSINOPHIL #: 0.1 x10ˆ3/uL (ref 0.00–0.50)
EOSINOPHIL %: 2 % (ref 1–7)
HCT: 42.7 % — ABNORMAL HIGH (ref 31.2–41.9)
HGB: 14.8 g/dL — ABNORMAL HIGH (ref 10.9–14.3)
LYMPHOCYTE #: 0.3 x10ˆ3/uL — ABNORMAL LOW (ref 1.10–3.10)
LYMPHOCYTE %: 8 % — ABNORMAL LOW (ref 16–46)
MCH: 33.4 pg — ABNORMAL HIGH (ref 24.7–32.8)
MCHC: 34.5 g/dL (ref 32.3–35.6)
MCV: 96.8 fL — ABNORMAL HIGH (ref 75.5–95.3)
MONOCYTE #: 0.2 x10ˆ3/uL (ref 0.20–0.90)
MONOCYTE %: 6 % (ref 4–11)
MPV: 10 fL (ref 7.9–10.8)
NEUTROPHIL #: 2.6 x10ˆ3/uL (ref 1.90–8.20)
NEUTROPHIL %: 83 % — ABNORMAL HIGH (ref 43–77)
PLATELETS: 115 x10ˆ3/uL — ABNORMAL LOW (ref 140–440)
RBC: 4.41 x10ˆ6/uL (ref 3.63–4.92)
RDW: 13 % (ref 12.3–17.7)
WBC: 3.1 x10ˆ3/uL — ABNORMAL LOW (ref 3.8–11.8)

## 2024-08-23 LAB — BASIC METABOLIC PANEL
ANION GAP: 6 mmol/L (ref 4–13)
BUN/CREA RATIO: 16 (ref 6–22)
BUN: 29 mg/dL — ABNORMAL HIGH (ref 7–25)
CALCIUM: 9.8 mg/dL (ref 8.6–10.3)
CHLORIDE: 109 mmol/L — ABNORMAL HIGH (ref 98–107)
CO2 TOTAL: 26 mmol/L (ref 21–31)
CREATININE: 1.76 mg/dL — ABNORMAL HIGH (ref 0.60–1.30)
ESTIMATED GFR: 29 mL/min/1.73mˆ2 — ABNORMAL LOW (ref >59)
GLUCOSE: 116 mg/dL — ABNORMAL HIGH (ref 74–109)
OSMOLALITY, CALCULATED: 288 mosm/kg (ref 270–290)
POTASSIUM: 5.3 mmol/L — ABNORMAL HIGH (ref 3.5–5.1)
SODIUM: 141 mmol/L (ref 136–145)

## 2024-08-23 LAB — PROTEIN/CREATININE RATIO, URINE, RANDOM
CREATININE RANDOM URINE: 31 mg/dL (ref 30–125)
PROTEIN RANDOM URINE: 15 mg/dL — ABNORMAL LOW (ref 50–80)
PROTEIN/CREATININE RATIO RANDOM URINE: 0.484 mg/mg (ref 0.000–200.000)

## 2024-08-23 LAB — URIC ACID: URIC ACID: 4.5 mg/dL (ref 2.3–7.6)

## 2024-08-23 LAB — MICROALBUMIN/CREATININE RATIO, URINE, RANDOM
CREATININE RANDOM URINE: 31 mg/dL (ref 30–125)
MICROALBUMIN RANDOM URINE: 1.1 mg/dL
MICROALBUMIN/CREATININE RATIO RANDOM URINE: 35.5 mg/g

## 2024-08-23 LAB — VITAMIN D 25 TOTAL: VITAMIN D 25, TOTAL: 45.77 ng/mL (ref 30.00–100.00)

## 2024-08-23 LAB — MAGNESIUM: MAGNESIUM: 2.1 mg/dL (ref 1.9–2.7)

## 2024-08-23 LAB — PARATHYROID HORMONE (PTH): PTH: 112.3 pg/mL — ABNORMAL HIGH (ref 12.0–88.0)

## 2024-08-25 ENCOUNTER — Encounter (HOSPITAL_COMMUNITY): Payer: Self-pay

## 2024-08-25 ENCOUNTER — Other Ambulatory Visit: Payer: Self-pay

## 2024-08-25 ENCOUNTER — Ambulatory Visit
Admission: RE | Admit: 2024-08-25 | Discharge: 2024-08-25 | Disposition: A | Discharge status: Home / Self Care | Source: Ambulatory Visit

## 2024-08-25 VITALS — BP 114/52 | HR 73 | Temp 97.8°F | Resp 16 | Ht 62.0 in | Wt 126.6 lb

## 2024-08-25 DIAGNOSIS — Z5111 Encounter for antineoplastic chemotherapy: Secondary | ICD-10-CM | POA: Insufficient documentation

## 2024-08-25 DIAGNOSIS — C160 Malignant neoplasm of cardia: Secondary | ICD-10-CM | POA: Insufficient documentation

## 2024-08-25 LAB — CBC WITH DIFF
BASOPHIL #: 0 x10ˆ3/uL (ref 0.00–0.10)
BASOPHIL %: 1 % (ref 0–1)
EOSINOPHIL #: 0.1 x10ˆ3/uL (ref 0.00–0.50)
EOSINOPHIL %: 4 % (ref 1–7)
HCT: 38 % (ref 31.2–41.9)
HGB: 13.5 g/dL (ref 10.9–14.3)
LYMPHOCYTE #: 0.3 x10ˆ3/uL — ABNORMAL LOW (ref 1.10–3.10)
LYMPHOCYTE %: 13 % — ABNORMAL LOW (ref 16–46)
MCH: 33.5 pg — ABNORMAL HIGH (ref 24.7–32.8)
MCHC: 35.4 g/dL (ref 32.3–35.6)
MCV: 94.6 fL (ref 75.5–95.3)
MONOCYTE #: 0.3 x10ˆ3/uL (ref 0.20–0.90)
MONOCYTE %: 14 % — ABNORMAL HIGH (ref 4–11)
MPV: 9.1 fL (ref 7.9–10.8)
NEUTROPHIL #: 1.4 x10ˆ3/uL — ABNORMAL LOW (ref 1.90–8.20)
NEUTROPHIL %: 69 % (ref 43–77)
PLATELETS: 101 x10ˆ3/uL — ABNORMAL LOW (ref 140–440)
RBC: 4.02 x10ˆ6/uL (ref 3.63–4.92)
RDW: 12.9 % (ref 12.3–17.7)
WBC: 2 x10ˆ3/uL — ABNORMAL LOW (ref 3.8–11.8)

## 2024-08-25 LAB — COMPREHENSIVE METABOLIC PANEL, NON-FASTING
ALBUMIN/GLOBULIN RATIO: 1.9 — ABNORMAL HIGH (ref 0.8–1.4)
ALBUMIN: 3.9 g/dL (ref 3.5–5.7)
ALKALINE PHOSPHATASE: 212 U/L — ABNORMAL HIGH (ref 34–104)
ALT (SGPT): 23 U/L (ref 7–52)
ANION GAP: 6 mmol/L (ref 4–13)
AST (SGOT): 30 U/L (ref 13–39)
BILIRUBIN TOTAL: 0.5 mg/dL (ref 0.3–1.0)
BUN/CREA RATIO: 18 (ref 6–22)
BUN: 29 mg/dL — ABNORMAL HIGH (ref 7–25)
CALCIUM, CORRECTED: 9.4 mg/dL (ref 8.9–10.8)
CALCIUM: 9.3 mg/dL (ref 8.6–10.3)
CHLORIDE: 108 mmol/L — ABNORMAL HIGH (ref 98–107)
CO2 TOTAL: 25 mmol/L (ref 21–31)
CREATININE: 1.62 mg/dL — ABNORMAL HIGH (ref 0.60–1.30)
ESTIMATED GFR: 33 mL/min/1.73mˆ2 — ABNORMAL LOW (ref >59)
GLOBULIN: 2.1 (ref 2.0–3.5)
GLUCOSE: 89 mg/dL (ref 74–109)
OSMOLALITY, CALCULATED: 283 mosm/kg (ref 270–290)
POTASSIUM: 4.5 mmol/L (ref 3.5–5.1)
PROTEIN TOTAL: 6 g/dL — ABNORMAL LOW (ref 6.4–8.9)
SODIUM: 139 mmol/L (ref 136–145)

## 2024-08-25 LAB — MAGNESIUM: MAGNESIUM: 2 mg/dL (ref 1.9–2.7)

## 2024-08-25 MED ORDER — ALBUTEROL SULFATE 2.5 MG/3 ML (0.083 %) SOLUTION FOR NEBULIZATION: 2.5000 mg | INHALATION_SOLUTION | Freq: Once | RESPIRATORY_TRACT | Status: DC | PRN

## 2024-08-25 MED ORDER — SODIUM CHLORIDE 0.9% FLUSH BAG - 250 ML: INTRAVENOUS | Status: DC | PRN

## 2024-08-25 MED ORDER — DEXAMETHASONE SODIUM PHOSPHATE (PF) 10 MG/ML INJECTION SOLUTION
10.0000 mg | Freq: Once | INTRAMUSCULAR | Status: AC
  Administered 2024-08-25: 10 mg via INTRAVENOUS
  Filled 2024-08-25: qty 1

## 2024-08-25 MED ORDER — DEXTROSE 5% IN WATER (D5W) FLUSH BAG - 250 ML: INTRAVENOUS | Status: DC | PRN

## 2024-08-25 MED ORDER — EPINEPHRINE 1 MG/ML (1 ML) INJECTION SOLUTION: 0.3000 mg | Freq: Once | INTRAMUSCULAR | Status: DC | PRN

## 2024-08-25 MED ORDER — FAMOTIDINE (PF) 20 MG/2 ML INTRAVENOUS SOLUTION
20.0000 mg | Freq: Once | INTRAVENOUS | Status: AC
  Administered 2024-08-25: 20 mg via INTRAVENOUS
  Filled 2024-08-25: qty 2

## 2024-08-25 MED ORDER — SODIUM CHLORIDE 0.9 % INTRAVENOUS SOLUTION
79.9500 mg | Freq: Once | INTRAVENOUS | Status: AC
  Administered 2024-08-25: 80 mg via INTRAVENOUS
  Administered 2024-08-25: 0 mg via INTRAVENOUS
  Filled 2024-08-25: qty 8

## 2024-08-25 MED ORDER — PALONOSETRON 0.25 MG/5 ML INTRAVENOUS SOLUTION
0.2500 mg | Freq: Once | INTRAVENOUS | Status: AC
  Administered 2024-08-25: 0.25 mg via INTRAVENOUS
  Filled 2024-08-25: qty 5

## 2024-08-25 MED ORDER — FAMOTIDINE (PF) 20 MG/2 ML INTRAVENOUS SOLUTION: 20.0000 mg | Freq: Once | INTRAVENOUS | Status: DC | PRN

## 2024-08-25 MED ORDER — SODIUM CHLORIDE 0.9 % INTRAVENOUS SOLUTION
50.0000 mg/m2 | Freq: Once | INTRAVENOUS | Status: AC
  Administered 2024-08-25: 81 mg via INTRAVENOUS
  Administered 2024-08-25: 0 mg via INTRAVENOUS
  Filled 2024-08-25: qty 13.5

## 2024-08-25 MED ORDER — DIPHENHYDRAMINE 50 MG/ML INJECTION SOLUTION: 50.0000 mg | Freq: Once | INTRAMUSCULAR | Status: DC | PRN

## 2024-08-25 MED ORDER — ALBUTEROL SULFATE HFA 90 MCG/ACTUATION AEROSOL INHALER - RN: 2.0000 | Freq: Once | RESPIRATORY_TRACT | Status: DC | PRN

## 2024-08-25 MED ORDER — MEPERIDINE (PF) 25 MG/ML INJECTION SOLUTION: 12.5000 mg | Freq: Once | INTRAMUSCULAR | Status: DC | PRN

## 2024-08-25 MED ORDER — DIPHENHYDRAMINE 50 MG/ML INJECTION SOLUTION
25.0000 mg | Freq: Once | INTRAMUSCULAR | Status: AC
  Administered 2024-08-25: 25 mg via INTRAVENOUS
  Filled 2024-08-25: qty 1

## 2024-08-25 MED ORDER — HYDROCORTISONE SOD SUCCINATE 100 MG/2 ML VIAL WRAPPER: 100.0000 mg | Freq: Once | INTRAMUSCULAR | Status: DC | PRN

## 2024-08-25 MED ORDER — DIPHENHYDRAMINE 50 MG/ML INJECTION SOLUTION: 25.0000 mg | Freq: Once | INTRAMUSCULAR | Status: DC | PRN

## 2024-08-25 MED ORDER — SODIUM CHLORIDE 0.9 % IV BOLUS: 500.0000 mL | INJECTION | Freq: Once | Status: DC | PRN

## 2024-08-25 NOTE — Nurses Notes (Signed)
 [phone]: Arrived to outpatient Oncology via wheelchair with family. Here today for chemo. Port to left chest accessed without difficulty. Flushes easily. Excellent blood return noted. Labs collected and sent. Awaiting results. Assessment completed. Lungs clear. Heart sounds normal. Abdomen soft, but patient reports epigastric tenderness, which is chronic in nature per her report. No edema noted. Denies all other complaints at present. Delon Larve, RN  Patient Assessment/Symptom Management Patient Has No MD Appointment Today   Key: (+) Symptom present           (-)  Symptom not present If Symptom is Positive(+) a Nursing Note is required   Edema -   Uncontrolled Nausea -   Vomiting -   Inability to eat/drink -   Mouth Sores -   Diarrhea -   Constipation (? Last BM) -   Fatigue that interferes with ADL's -   Numbness/Tingling -change -   Other -   Fever/Signs & Symptoms of infection -   Nurse Initials JT     [phone]: Lab results reviewed. Dr Artemisa office called. Awaiting a return phone call. Delon Larve, RN  [phone]: Return phone call received. Okay to treat today per Dr Dorthy. Orders released to pharmacy. Delon Larve, RN  [phone]: Medicated with Aloxi  IV, Decadron  10 mg IV, Benadryl  25 mg IV, and Pepcid  20 mg IV per protocol. Delon Larve, RN  1037: Taxol  81 mg infusion started at this time. Delon Larve, RN  [phone]: Taxol  complete and line flushing. Delon Larve, RN  [phone]: Carboplatin  infusion started at this time. Delon Larve, RN  1249: Carboplatin  infusion complete and line flushing. Delon Larve, RN  [phone]: Line flush complete. VSS. Port flushed and de-accessed per protocol. Pressure dressing applied. No bleeding noted at site. Delon Larve, RN  1318: Assisted to wheelchair. Out of department with family. Checking out at this time. Delon Larve, RN

## 2024-08-27 ENCOUNTER — Other Ambulatory Visit (INDEPENDENT_AMBULATORY_CARE_PROVIDER_SITE_OTHER): Payer: Self-pay

## 2024-08-30 ENCOUNTER — Other Ambulatory Visit (HOSPITAL_COMMUNITY): Payer: Self-pay

## 2024-08-30 ENCOUNTER — Encounter (INDEPENDENT_AMBULATORY_CARE_PROVIDER_SITE_OTHER): Payer: Self-pay | Admitting: Nephrology

## 2024-08-30 DIAGNOSIS — C160 Malignant neoplasm of cardia: Secondary | ICD-10-CM

## 2024-09-01 ENCOUNTER — Encounter (HOSPITAL_COMMUNITY): Payer: Self-pay

## 2024-09-01 ENCOUNTER — Ambulatory Visit
Admission: RE | Admit: 2024-09-01 | Discharge: 2024-09-01 | Disposition: A | Discharge status: Home / Self Care | Source: Ambulatory Visit

## 2024-09-01 ENCOUNTER — Other Ambulatory Visit: Payer: Self-pay

## 2024-09-01 VITALS — BP 123/52 | HR 85 | Temp 97.3°F | Resp 18 | Wt 121.4 lb

## 2024-09-01 DIAGNOSIS — C160 Malignant neoplasm of cardia: Secondary | ICD-10-CM

## 2024-09-01 LAB — COMPREHENSIVE METABOLIC PANEL, NON-FASTING
ALBUMIN/GLOBULIN RATIO: 1.7 — ABNORMAL HIGH (ref 0.8–1.4)
ALBUMIN: 3.8 g/dL (ref 3.5–5.7)
ALKALINE PHOSPHATASE: 190 U/L — ABNORMAL HIGH (ref 34–104)
ALT (SGPT): 18 U/L (ref 7–52)
ANION GAP: 5 mmol/L (ref 4–13)
AST (SGOT): 21 U/L (ref 13–39)
BILIRUBIN TOTAL: 0.6 mg/dL (ref 0.3–1.0)
BUN/CREA RATIO: 13 (ref 6–22)
BUN: 23 mg/dL (ref 7–25)
CALCIUM, CORRECTED: 9.5 mg/dL (ref 8.9–10.8)
CALCIUM: 9.3 mg/dL (ref 8.6–10.3)
CHLORIDE: 108 mmol/L — ABNORMAL HIGH (ref 98–107)
CO2 TOTAL: 26 mmol/L (ref 21–31)
CREATININE: 1.82 mg/dL — ABNORMAL HIGH (ref 0.60–1.30)
ESTIMATED GFR: 28 mL/min/{1.73_m2} — ABNORMAL LOW (ref >59)
GLOBULIN: 2.2 (ref 2.0–3.5)
GLUCOSE: 97 mg/dL (ref 74–109)
OSMOLALITY, CALCULATED: 281 mosm/kg (ref 270–290)
POTASSIUM: 5.2 mmol/L — ABNORMAL HIGH (ref 3.5–5.1)
PROTEIN TOTAL: 6 g/dL — ABNORMAL LOW (ref 6.4–8.9)
SODIUM: 139 mmol/L (ref 136–145)

## 2024-09-01 LAB — CBC WITH DIFF
BASOPHIL #: 0 10*3/uL (ref 0.00–0.10)
BASOPHIL %: 0 % (ref 0–1)
EOSINOPHIL #: 0 10*3/uL (ref 0.00–0.50)
EOSINOPHIL %: 2 % (ref 1–7)
HCT: 38.6 % (ref 31.2–41.9)
HGB: 13.4 g/dL (ref 10.9–14.3)
LYMPHOCYTE #: 0.2 10*3/uL — ABNORMAL LOW (ref 1.10–3.10)
LYMPHOCYTE %: 10 % — ABNORMAL LOW (ref 16–46)
MCH: 33.1 pg — ABNORMAL HIGH (ref 24.7–32.8)
MCHC: 34.7 g/dL (ref 32.3–35.6)
MCV: 95.3 fL (ref 75.5–95.3)
MONOCYTE #: 0.4 10*3/uL (ref 0.20–0.90)
MONOCYTE %: 20 % — ABNORMAL HIGH (ref 4–11)
MPV: 8.7 fL (ref 7.9–10.8)
NEUTROPHIL #: 1.4 10*3/uL — ABNORMAL LOW (ref 1.90–8.20)
NEUTROPHIL %: 68 % (ref 43–77)
PLATELETS: 135 10*3/uL — ABNORMAL LOW (ref 140–440)
RBC: 4.04 10*6/uL (ref 3.63–4.92)
RDW: 13.1 % (ref 12.3–17.7)
WBC: 2.1 10*3/uL — ABNORMAL LOW (ref 3.8–11.8)

## 2024-09-01 LAB — MAGNESIUM: MAGNESIUM: 2.3 mg/dL (ref 1.9–2.7)

## 2024-09-01 MED ORDER — DEXAMETHASONE SODIUM PHOSPHATE (PF) 10 MG/ML INJECTION SOLUTION
10.0000 mg | Freq: Once | INTRAMUSCULAR | Status: AC
  Administered 2024-09-01: 10 mg via INTRAVENOUS
  Filled 2024-09-01: qty 1

## 2024-09-01 MED ORDER — SODIUM CHLORIDE 0.9 % INTRAVENOUS SOLUTION
75.3000 mg | Freq: Once | INTRAVENOUS | Status: AC
  Administered 2024-09-01: 0 mg via INTRAVENOUS
  Administered 2024-09-01: 75 mg via INTRAVENOUS
  Filled 2024-09-01: qty 7.5

## 2024-09-01 MED ORDER — DIPHENHYDRAMINE 50 MG/ML INJECTION SOLUTION: 50.0000 mg | Freq: Once | INTRAMUSCULAR | Status: DC | PRN

## 2024-09-01 MED ORDER — DIPHENHYDRAMINE 50 MG/ML INJECTION SOLUTION
25.0000 mg | Freq: Once | INTRAMUSCULAR | Status: AC
  Administered 2024-09-01: 25 mg via INTRAVENOUS
  Filled 2024-09-01: qty 1

## 2024-09-01 MED ORDER — EPINEPHRINE 1 MG/ML (1 ML) INJECTION SOLUTION: 0.3000 mg | Freq: Once | INTRAMUSCULAR | Status: DC | PRN

## 2024-09-01 MED ORDER — PALONOSETRON 0.25 MG/5 ML INTRAVENOUS SOLUTION
0.2500 mg | Freq: Once | INTRAVENOUS | Status: AC
  Administered 2024-09-01: 0.25 mg via INTRAVENOUS
  Filled 2024-09-01: qty 5

## 2024-09-01 MED ORDER — SODIUM CHLORIDE 0.9% FLUSH BAG - 250 ML: INTRAVENOUS | Status: DC | PRN

## 2024-09-01 MED ORDER — DEXTROSE 5% IN WATER (D5W) FLUSH BAG - 250 ML: INTRAVENOUS | Status: DC | PRN

## 2024-09-01 MED ORDER — FAMOTIDINE (PF) 20 MG/2 ML INTRAVENOUS SOLUTION
20.0000 mg | Freq: Once | INTRAVENOUS | Status: AC
  Administered 2024-09-01: 20 mg via INTRAVENOUS
  Filled 2024-09-01: qty 2

## 2024-09-01 MED ORDER — FAMOTIDINE (PF) 20 MG/2 ML INTRAVENOUS SOLUTION: 20.0000 mg | Freq: Once | INTRAVENOUS | Status: DC | PRN

## 2024-09-01 MED ORDER — HYDROCORTISONE SOD SUCCINATE 100 MG/2 ML VIAL WRAPPER: 100.0000 mg | Freq: Once | INTRAMUSCULAR | Status: DC | PRN

## 2024-09-01 MED ORDER — SODIUM CHLORIDE 0.9 % INTRAVENOUS SOLUTION
50.0000 mg/m2 | Freq: Once | INTRAVENOUS | Status: AC
  Administered 2024-09-01: 81 mg via INTRAVENOUS
  Administered 2024-09-01: 0 mg via INTRAVENOUS
  Filled 2024-09-01: qty 13.5

## 2024-09-01 MED ORDER — DIPHENHYDRAMINE 50 MG/ML INJECTION SOLUTION: 25.0000 mg | Freq: Once | INTRAMUSCULAR | Status: DC | PRN

## 2024-09-01 MED ORDER — ALBUTEROL SULFATE 2.5 MG/3 ML (0.083 %) SOLUTION FOR NEBULIZATION: 2.5000 mg | INHALATION_SOLUTION | Freq: Once | RESPIRATORY_TRACT | Status: DC | PRN

## 2024-09-01 MED ORDER — SODIUM CHLORIDE 0.9 % IV BOLUS: 500.0000 mL | INJECTION | Freq: Once | Status: DC | PRN

## 2024-09-01 MED ORDER — ALBUTEROL SULFATE HFA 90 MCG/ACTUATION AEROSOL INHALER - RN: 2.0000 | Freq: Once | RESPIRATORY_TRACT | Status: DC | PRN

## 2024-09-01 MED ORDER — MEPERIDINE (PF) 25 MG/ML INJECTION SOLUTION: 12.5000 mg | Freq: Once | INTRAMUSCULAR | Status: DC | PRN

## 2024-09-01 NOTE — Nurses Notes (Signed)
 0825 Pt to OP Onc unit for tx via wheelchair with husband to assist. VS obtained.SABRA Scull accessed with ease. Blood return obtained and flushes easily. Unable to obtain enough blood from port to send for labs. Labs drawn peripherally by Lura Budge LPN and sent to lab via hospital tube system. Rexene Lipps, RN  Patient Assessment/Symptom Management Patient Has No MD Appointment Today   Key: (+) Symptom present           (-)  Symptom not present If Symptom is Positive(+) a Nursing Note is required   Edema Trace BLE   Uncontrolled Nausea -   Vomiting -   Inability to eat/drink -   Mouth Sores -   Diarrhea -   Constipation (? Last BM) -   Fatigue that interferes with ADL's -   Numbness/Tingling -change -   Other -   Fever/Signs & Symptoms of infection -   Nurse Initials TL RN    1024 Lab results (Creat 1.82, GFR 28, K+5.2) called to Candace at Dr.Mock's office. No new orders given. Meds released to pharmacy per order. Rexene Lipps, RN  [phone] Aloxi  0.25 mg IVP given. Rexene Lipps, RN  1030 Benadryl  25 mg IVP given. Rexene Lipps, RN  [phone] Decadron  10 mg slow IVP given. Rexene Lipps, RN  [phone] Pepcid  20 mg Ivp given. Rexene Lipps, RN  [phone] Taxol  81 mg IV infusion started. Rexene Lipps, RN  [phone] Taxol  infusion completed. Line flushed with NS. Rexene Lipps, RN  [phone] Carboplatin  75 mg IV infusion started. Rexene Lipps, RN  [phone] Carboplatin  infusion completed. Line flushed with NS.  Rexene Lipps, RN  [phone] VS obtained. Pt tolerated tx without difficulty. Rexene Lipps, RN  [phone] Port flushed with 30 ml NS. [place] d/c'ed. Site clear. Drsg applied. Rexene Lipps, RN  [phone] Pt taken to private vehicle via wheelchair with this RN to assist. No adverse reaction noted. Rexene Lipps, RN

## 2024-09-06 ENCOUNTER — Encounter (HOSPITAL_COMMUNITY): Payer: Self-pay

## 2024-09-07 ENCOUNTER — Other Ambulatory Visit (INDEPENDENT_AMBULATORY_CARE_PROVIDER_SITE_OTHER)

## 2024-09-08 ENCOUNTER — Other Ambulatory Visit (HOSPITAL_COMMUNITY): Payer: Self-pay

## 2024-09-08 ENCOUNTER — Encounter (HOSPITAL_COMMUNITY): Payer: Self-pay

## 2024-09-08 ENCOUNTER — Other Ambulatory Visit: Payer: Self-pay

## 2024-09-08 ENCOUNTER — Ambulatory Visit: Admission: RE | Admit: 2024-09-08 | Discharge: 2024-09-08

## 2024-09-08 VITALS — BP 128/85 | HR 94 | Temp 97.0°F | Resp 18

## 2024-09-08 DIAGNOSIS — C160 Malignant neoplasm of cardia: Secondary | ICD-10-CM

## 2024-09-08 LAB — COMPREHENSIVE METABOLIC PANEL, NON-FASTING
ALBUMIN/GLOBULIN RATIO: 1.5 — ABNORMAL HIGH (ref 0.8–1.4)
ALBUMIN: 4.3 g/dL (ref 3.5–5.7)
ALKALINE PHOSPHATASE: 191 U/L — ABNORMAL HIGH (ref 34–104)
ALT (SGPT): 11 U/L (ref 7–52)
ANION GAP: 11 mmol/L (ref 4–13)
AST (SGOT): 17 U/L (ref 13–39)
BILIRUBIN TOTAL: 0.7 mg/dL (ref 0.3–1.0)
BUN/CREA RATIO: 15 (ref 6–22)
BUN: 38 mg/dL — ABNORMAL HIGH (ref 7–25)
CALCIUM, CORRECTED: 9.8 mg/dL (ref 8.9–10.8)
CALCIUM: 10 mg/dL (ref 8.6–10.3)
CHLORIDE: 116 mmol/L — ABNORMAL HIGH (ref 98–107)
CO2 TOTAL: 23 mmol/L (ref 21–31)
CREATININE: 2.5 mg/dL — ABNORMAL HIGH (ref 0.60–1.30)
ESTIMATED GFR: 19 mL/min/{1.73_m2} — ABNORMAL LOW (ref >59)
GLOBULIN: 2.8 (ref 2.0–3.5)
GLUCOSE: 97 mg/dL (ref 74–109)
OSMOLALITY, CALCULATED: 307 mosm/kg — ABNORMAL HIGH (ref 270–290)
POTASSIUM: 4.2 mmol/L (ref 3.5–5.1)
PROTEIN TOTAL: 7.1 g/dL (ref 6.4–8.9)
SODIUM: 150 mmol/L — ABNORMAL HIGH (ref 136–145)

## 2024-09-08 LAB — MANUAL DIFFERENTIAL
EOSINOPHIL %: 2 % (ref 0–7)
EOSINOPHIL ABSOLUTE: 0.04 10*3/uL (ref 0.00–0.80)
EOSINOPHILS MANUAL: 2
LYMPHOCYTE %: 10 % — ABNORMAL LOW (ref 25–45)
LYMPHOCYTE ABSOLUTE: 0.19 10*3/uL — ABNORMAL LOW (ref 1.10–5.00)
LYMPHOCYTES MANUAL: 10
MONOCYTE %: 12 % (ref 0–12)
MONOCYTE ABSOLUTE: 0.23 10*3/uL (ref 0.00–0.30)
MONOCYTES MANUAL: 12
NEUTROPHIL %: 76 % (ref 40–76)
NEUTROPHIL ABSOLUTE: 1.44 10*3/uL — ABNORMAL LOW (ref 1.80–8.40)
NEUTROPHILS MANUAL: 76
PLATELET MORPHOLOGY COMMENT: NORMAL
RBC MORPHOLOGY COMMENT: NORMAL
SCHISTOCYTES: ABSENT
TOTAL CELLS COUNTED [#] IN BLOOD: 100
WBC: 1.9 10*3/uL

## 2024-09-08 LAB — CBC WITH DIFF
HCT: 39.8 % (ref 31.2–41.9)
HGB: 13.9 g/dL (ref 10.9–14.3)
MCH: 33.3 pg — ABNORMAL HIGH (ref 24.7–32.8)
MCHC: 35 g/dL (ref 32.3–35.6)
MCV: 95.2 fL (ref 75.5–95.3)
MPV: 8.8 fL (ref 7.9–10.8)
PLATELETS: 142 10*3/uL (ref 140–440)
RBC: 4.18 10*6/uL (ref 3.63–4.92)
RDW: 12.6 % (ref 12.3–17.7)
WBC: 1.9 10*3/uL — CL (ref 3.8–11.8)

## 2024-09-08 MED ORDER — SODIUM CHLORIDE 0.9 % IV BOLUS
1000.0000 mL | INJECTION | Freq: Once | Status: AC
  Administered 2024-09-08: 0 mL via INTRAVENOUS
  Administered 2024-09-08: 1000 mL via INTRAVENOUS

## 2024-09-08 NOTE — Nurses Notes (Signed)
 [id number]: Arrived to outpatient oncology via wheelchair. Here today for labs and IV hydration. Assessment completed. Reports pain in her throat and weakness. Denies all other complaints at this time. Port to left chest accessed without difficulty. Flushes easily. Small amount of blood return noted, but not enough to obtain labs. Labs drawn peripherally and sent. 1 liter NS given over 1 hour. Tolerated well. Port flushed and de-accessed per protocol. Pressure dressing applied. No bleeding noted at site. Out of department via wheelchair with family. Going to radiation appointment. Delon Larve, RN

## 2024-09-14 ENCOUNTER — Encounter (INDEPENDENT_AMBULATORY_CARE_PROVIDER_SITE_OTHER): Admitting: Nephrology

## 2024-09-21 ENCOUNTER — Other Ambulatory Visit (INDEPENDENT_AMBULATORY_CARE_PROVIDER_SITE_OTHER)

## 2024-09-28 ENCOUNTER — Encounter (INDEPENDENT_AMBULATORY_CARE_PROVIDER_SITE_OTHER): Admitting: Nephrology

## 2025-01-04 ENCOUNTER — Ambulatory Visit (HOSPITAL_COMMUNITY): Payer: Self-pay

## 2025-01-12 ENCOUNTER — Encounter (INDEPENDENT_AMBULATORY_CARE_PROVIDER_SITE_OTHER): Payer: Self-pay | Admitting: Physician Assistant
